# Patient Record
Sex: Female | Born: 1935
Health system: Southern US, Community
[De-identification: ages and names within clinical notes are randomized; demographics above are authoritative.]

## PROBLEM LIST (undated history)

## (undated) DIAGNOSIS — M1990 Unspecified osteoarthritis, unspecified site: Secondary | ICD-10-CM

## (undated) DIAGNOSIS — Z8619 Personal history of other infectious and parasitic diseases: Secondary | ICD-10-CM

## (undated) DIAGNOSIS — F329 Major depressive disorder, single episode, unspecified: Secondary | ICD-10-CM

## (undated) DIAGNOSIS — F32A Depression, unspecified: Secondary | ICD-10-CM

## (undated) DIAGNOSIS — Z8371 Family history of colonic polyps: Secondary | ICD-10-CM

## (undated) DIAGNOSIS — IMO0002 Reserved for concepts with insufficient information to code with codable children: Secondary | ICD-10-CM

## (undated) DIAGNOSIS — E785 Hyperlipidemia, unspecified: Secondary | ICD-10-CM

## (undated) DIAGNOSIS — Z860100 Personal history of colon polyps, unspecified: Secondary | ICD-10-CM

## (undated) DIAGNOSIS — Z83719 Family history of colon polyps, unspecified: Secondary | ICD-10-CM

## (undated) DIAGNOSIS — N390 Urinary tract infection, site not specified: Secondary | ICD-10-CM

## (undated) DIAGNOSIS — I10 Essential (primary) hypertension: Secondary | ICD-10-CM

## (undated) HISTORY — DX: Urinary tract infection, site not specified: N39.0

## (undated) HISTORY — DX: Family history of colon polyps, unspecified: Z83.719

## (undated) HISTORY — DX: Major depressive disorder, single episode, unspecified: F32.9

## (undated) HISTORY — DX: Personal history of other infectious and parasitic diseases: Z86.19

## (undated) HISTORY — DX: Depression, unspecified: F32.A

## (undated) HISTORY — DX: Family history of colonic polyps: Z83.71

## (undated) HISTORY — DX: Reserved for concepts with insufficient information to code with codable children: IMO0002

## (undated) HISTORY — DX: Essential (primary) hypertension: I10

## (undated) HISTORY — DX: Hyperlipidemia, unspecified: E78.5

## (undated) HISTORY — DX: Personal history of colon polyps, unspecified: Z86.0100

## (undated) HISTORY — DX: Unspecified osteoarthritis, unspecified site: M19.90

## (undated) HISTORY — DX: Personal history of colonic polyps: Z86.010

---

## 1999-10-05 HISTORY — PX: BREAST SURGERY: SHX581

## 2009-03-05 ENCOUNTER — Ambulatory Visit: Payer: Self-pay | Admitting: Family Medicine

## 2009-06-09 ENCOUNTER — Emergency Department (HOSPITAL_COMMUNITY): Admission: EM | Admit: 2009-06-09 | Discharge: 2009-06-09 | Payer: Self-pay | Admitting: Family Medicine

## 2009-11-07 ENCOUNTER — Ambulatory Visit: Payer: Self-pay | Admitting: Gastroenterology

## 2010-03-06 ENCOUNTER — Ambulatory Visit: Payer: Self-pay | Admitting: Family Medicine

## 2011-01-08 LAB — POCT URINALYSIS DIP (DEVICE)
Bilirubin Urine: NEGATIVE
Glucose, UA: NEGATIVE mg/dL
Hgb urine dipstick: NEGATIVE
Nitrite: NEGATIVE
Specific Gravity, Urine: 1.01 (ref 1.005–1.030)
Urobilinogen, UA: 0.2 mg/dL (ref 0.0–1.0)
pH: 7.5 (ref 5.0–8.0)

## 2011-01-08 LAB — URINE CULTURE: Colony Count: 80000

## 2011-04-27 ENCOUNTER — Ambulatory Visit: Payer: Self-pay | Admitting: Family Medicine

## 2012-01-01 LAB — HM COLONOSCOPY: HM Colonoscopy: 5

## 2012-03-17 ENCOUNTER — Encounter (HOSPITAL_COMMUNITY): Payer: Self-pay | Admitting: Emergency Medicine

## 2012-03-17 ENCOUNTER — Emergency Department (INDEPENDENT_AMBULATORY_CARE_PROVIDER_SITE_OTHER)
Admission: EM | Admit: 2012-03-17 | Discharge: 2012-03-17 | Disposition: A | Discharge status: Home / Self Care | Payer: Medicare Other | Source: Home / Self Care | Attending: Emergency Medicine | Admitting: Emergency Medicine

## 2012-03-17 DIAGNOSIS — N390 Urinary tract infection, site not specified: Secondary | ICD-10-CM

## 2012-03-17 HISTORY — DX: Essential (primary) hypertension: I10

## 2012-03-17 LAB — POCT URINALYSIS DIP (DEVICE)
Glucose, UA: NEGATIVE mg/dL
Specific Gravity, Urine: 1.025 (ref 1.005–1.030)
pH: 5.5 (ref 5.0–8.0)

## 2012-03-17 MED ORDER — CEPHALEXIN 500 MG PO CAPS: 500.0000 mg | ORAL_CAPSULE | Freq: Three times a day (TID) | ORAL | Status: AC

## 2012-03-17 MED ORDER — ONDANSETRON 8 MG PO TBDP: 8.0000 mg | ORAL_TABLET | Freq: Three times a day (TID) | ORAL | Status: AC | PRN

## 2012-03-17 MED ORDER — CEFTRIAXONE SODIUM 1 G IJ SOLR
1.0000 g | Freq: Once | INTRAMUSCULAR | Status: AC
  Administered 2012-03-17: 1 g via INTRAMUSCULAR

## 2012-03-17 MED ORDER — CEFTRIAXONE SODIUM 1 G IJ SOLR
INTRAMUSCULAR | Status: AC
  Filled 2012-03-17: qty 10

## 2012-03-17 NOTE — ED Notes (Signed)
Pt is oriented  x3 and states the fever started today and vomited once this morning. Vomit was a yellow fluid. Pt states she has not eaten since yesterday and only drinking fluids. Pt is speaking full sentences without difficultly breathing, and states she feels some nausea. I advise Pt that if anything changes to let the front desk know.

## 2012-03-17 NOTE — ED Notes (Signed)
PT HERE WITH POSS VIRAL INFECTION THAT STARTED Wednesday FROM EXPOSURE OF FAMILY MEMBER WITH GI SX'S.STATES ONSET TEMP TODAY 104.0 THEN RECHECKED 103.0 BUT NO FEVER REDUCERS TAKEN.ALSO REPORTS X 1 EPISODE OF VOMITING THIS AM AND POOR APPETITE.NO DIARRHEA,CP OR SOB

## 2012-03-17 NOTE — Discharge Instructions (Signed)

## 2012-03-17 NOTE — ED Provider Notes (Signed)
Chief Complaint  Patient presents with  . Fever  . GI Problem    History of Present Illness:   The patient is a 78 year old female who has had a two-day history of fever of up to 104, chills, nauseated, and she vomited once. She denies any headache, nasal congestion, rhinorrhea, sore throat or stiff neck. She's had no coughing, shortness of breath, or chest pain. She denies any abdominal pain, diarrhea, blood in the stool. She's had no urinary frequency, urgency, dysuria, or blood in the urine. She denies any skin rash, or history of a tick bite. She's had no suspicious exposures, animal exposure, recent foreign travel.  Review of Systems:  Other than noted above, the patient denies any of the following symptoms. Systemic:  No fever, chills, sweats, fatigue, myalgias, headache, or anorexia. Eye:  No redness, pain or drainage. ENT:  No earache, nasal congestion, rhinorrhea, sinus pressure, or sore throat. Lungs:  No cough, sputum production, wheezing, shortness of breath.  Cardiovascular:  No chest pain, palpitations, or syncope. GI:  No nausea, vomiting, abdominal pain or diarrhea. GU:  No dysuria, frequency, or hematuria. Skin:  No rash or pruritis.  PMFSH:  Past medical history, family history, social history, meds, and allergies were reviewed.  Physical Exam:   Vital signs:  BP 183/69  Pulse 82  Temp 101.8 F (38.8 C) (Oral)  Resp 20  SpO2 97% General:  Alert, in no distress. Eye:  PERRL, full EOMs.  Lids and conjunctivas were normal. ENT:  TMs and canals were normal, without erythema or inflammation.  Nasal mucosa was clear and uncongested, without drainage.  Mucous membranes were moist.  Pharynx was clear, without exudate or drainage.  There were no oral ulcerations or lesions. Neck:  Supple, no adenopathy, tenderness or mass. Thyroid was normal. Lungs:  No respiratory distress.  Lungs were clear to auscultation, without wheezes, rales or rhonchi.  Breath sounds were clear and  equal bilaterally. Heart:  Regular rhythm, without gallops, murmers or rubs. Abdomen:  Soft, flat, and non-tender to palpation.  No hepatosplenomagaly or mass. Skin:  Clear, warm, and dry, without rash or lesions.  Labs:   Results for orders placed during the hospital encounter of 03/17/12  POCT URINALYSIS DIP (DEVICE)      Component Value Range   Glucose, UA NEGATIVE  NEGATIVE mg/dL   Bilirubin Urine SMALL (*) NEGATIVE   Ketones, ur 15 (*) NEGATIVE mg/dL   Specific Gravity, Urine 1.025  1.005 - 1.030   Hgb urine dipstick MODERATE (*) NEGATIVE   pH 5.5  5.0 - 8.0   Protein, ur >=300 (*) NEGATIVE mg/dL   Urobilinogen, UA 4.0 (*) 0.0 - 1.0 mg/dL   Nitrite NEGATIVE  NEGATIVE   Leukocytes, UA TRACE (*) NEGATIVE    Other Labs Obtained at Urgent Care Center:  A urine culture was obtained.  Results are pending at this time and we will call about any positive results.  Assessment:  The encounter diagnosis was UTI (lower urinary tract infection).  Plan:   1.  The following meds were prescribed:   New Prescriptions   CEPHALEXIN (KEFLEX) 500 MG CAPSULE    Take 1 capsule (500 mg total) by mouth 3 (three) times daily.   ONDANSETRON (ZOFRAN ODT) 8 MG DISINTEGRATING TABLET    Take 1 tablet (8 mg total) by mouth every 8 (eight) hours as needed for nausea.   2.  The patient was instructed in symptomatic care and handouts were given. 3.  The patient  was told to return if becoming worse in any way, if no better in 3 or 4 days, and given some red flag symptoms that would indicate earlier return.  Follow up:  The patient was told to follow up here in 48 hours for recheck. She was given some red flag symptoms that would indicate earlier return to the emergency department.     Reuben Likes, MD 03/17/12 2106

## 2012-03-17 NOTE — ED Notes (Signed)
No reaction post atb im injection

## 2012-03-19 ENCOUNTER — Encounter (HOSPITAL_COMMUNITY): Payer: Self-pay

## 2012-03-19 ENCOUNTER — Emergency Department (HOSPITAL_COMMUNITY)
Admission: EM | Admit: 2012-03-19 | Discharge: 2012-03-19 | Disposition: A | Discharge status: Home / Self Care | Payer: Medicare Other | Source: Home / Self Care | Attending: Emergency Medicine | Admitting: Emergency Medicine

## 2012-03-19 DIAGNOSIS — N289 Disorder of kidney and ureter, unspecified: Secondary | ICD-10-CM

## 2012-03-19 LAB — POCT URINALYSIS DIP (DEVICE)
Ketones, ur: NEGATIVE mg/dL
Protein, ur: >=300 mg/dL — AB
pH: 5.5 (ref 5.0–8.0)

## 2012-03-19 LAB — DIFFERENTIAL
Basophils Absolute: 0 10*3/uL (ref 0.0–0.1)
Eosinophils Relative: 0 % (ref 0–5)
Lymphocytes Relative: 8 % — ABNORMAL LOW (ref 12–46)
Lymphs Abs: 0.8 10*3/uL (ref 0.7–4.0)
Neutro Abs: 8.8 10*3/uL — ABNORMAL HIGH (ref 1.7–7.7)
Neutrophils Relative %: 85 % — ABNORMAL HIGH (ref 43–77)

## 2012-03-19 LAB — CBC
MCV: 90 fL (ref 78.0–100.0)
Platelets: 164 10*3/uL (ref 150–400)
RBC: 3.9 MIL/uL (ref 3.87–5.11)
RDW: 13.8 % (ref 11.5–15.5)
WBC: 10.3 10*3/uL (ref 4.0–10.5)

## 2012-03-19 LAB — POCT I-STAT, CHEM 8
BUN: 22 mg/dL (ref 6–23)
Potassium: 3.7 mEq/L (ref 3.5–5.1)
Sodium: 135 mEq/L (ref 135–145)
TCO2: 25 mmol/L (ref 0–100)

## 2012-03-19 NOTE — ED Provider Notes (Signed)
History     CSN: [id number]  Arrival date & time 03/19/12  1103   First MD Initiated Contact with Patient 03/19/12 1111      Chief Complaint  Patient presents with  . Follow-up    (Consider location/radiation/quality/duration/timing/severity/associated sxs/prior treatment) HPI Comments: Patient returns here as instructed for a followup visit after 48 hours after having started antibiotics. She describes that occasionally the antibiotics make her nauseous, she had a fever yesterday of 104. She was not taking any, tylenol or Motrin today.. She denies any abdominal pain, vomiting or diarrhea .  I just feel lfeel tired and weak" . Patient continues to deny any respiratory symptoms such as cough, upper congestion or shortness of breath.  On further questioning patient denies any flank pain, vomiting, or headaches. She is taking her antibiotics as prescribed and took a Tylenol dose yesterday denies taking any other medications for her symptoms.     The history is provided by the patient.    Past Medical History  Diagnosis Date  . Hypertension     History reviewed. No pertinent past surgical history.  History reviewed. No pertinent family history.  History  Substance Use Topics  . Smoking status: Never Smoker   . Smokeless tobacco: Not on file  . Alcohol Use: Yes     occasional    OB History    Grav Para Term Preterm Abortions TAB SAB Ect Mult Living                  Review of Systems  Constitutional: Positive for fever, activity change, appetite change and fatigue. Negative for chills and unexpected weight change.  HENT: Negative for congestion, neck pain, neck stiffness and postnasal drip.   Eyes: Negative for photophobia, pain and visual disturbance.  Respiratory: Negative for cough and shortness of breath.   Cardiovascular: Negative for chest pain and leg swelling.  Genitourinary: Positive for urgency and frequency. Negative for dysuria, flank pain, enuresis,  vaginal pain, pelvic pain and dyspareunia.  Skin: Negative for rash.  Neurological: Negative for dizziness, numbness and headaches.    Allergies  Review of patient's allergies indicates no known allergies.  Home Medications   Current Outpatient Rx  Name Route Sig Dispense Refill  . AMLODIPINE BESYLATE 5 MG PO TABS Oral Take 5 mg by mouth daily.    . ATENOLOL 25 MG PO TABS Oral Take 25 mg by mouth daily.    . CEPHALEXIN 500 MG PO CAPS Oral Take 1 capsule (500 mg total) by mouth 3 (three) times daily. 30 capsule 0  . ONDANSETRON 8 MG PO TBDP Oral Take 1 tablet (8 mg total) by mouth every 8 (eight) hours as needed for nausea. 20 tablet 0  . SIMVASTATIN 20 MG PO TABS Oral Take 20 mg by mouth every evening.      BP 138/82  Pulse 63  Temp 99.1 F (37.3 C) (Oral)  Resp 16  SpO2 96%  Physical Exam  Nursing note and vitals reviewed. Constitutional: She appears well-developed and well-nourished. No distress.  Pulmonary/Chest: Effort normal and breath sounds normal.  Abdominal: Soft. She exhibits no distension and no mass. There is no tenderness. There is no rigidity, no rebound, no guarding, no CVA tenderness, no tenderness at McBurney's point and negative Murphy's sign.  Skin: No rash noted.    ED Course  Procedures (including critical care time)  Labs Reviewed  POCT URINALYSIS DIP (DEVICE) - Abnormal; Notable for the following:    Bilirubin Urine SMALL (*)  Hgb urine dipstick SMALL (*)     Protein, ur >=300 (*)     All other components within normal limits  CBC - Abnormal; Notable for the following:    HCT 35.1 (*)     MCHC 36.8 (*)     All other components within normal limits  DIFFERENTIAL - Abnormal; Notable for the following:    Neutrophils Relative 85 (*)     Neutro Abs 8.8 (*)     Lymphocytes Relative 8 (*)     All other components within normal limits  POCT I-STAT, CHEM 8 - Abnormal; Notable for the following:    Creatinine, Ser 1.70 (*)     Glucose, Bld 110  (*)     Calcium, Ion 1.07 (*)     All other components within normal limits  URINE CULTURE   No results found.   1. Renal insufficiency, mild    2.mild dehydration 3. urinary symptoms    MDM  Patient looks comfortable in no distress with a normal abdominal exam. Symptomatically patient expresses predominantly weakness and polyuria. Patient has a normal creatinine and 1.7 with no baseline labs to compare with with noticeable proteinuria. Patient was otherwise to followup with her primary care Dr. early this week to continue monitoring her treatment response and her creatinine levels. We discuss in detail the different possibilities including mild to moderate dehydration, patient understand that this test needs to be repeated in her condition needs to be monitored. We also discuss symptoms that would warrant further evaluation in the emergency department. Patient is afebrile we have sent a sample today with for a urine culture as she continues to experience some degree of increased urinary frequency.        Jimmie Molly, MD 03/19/12 [phone]

## 2012-03-19 NOTE — Discharge Instructions (Signed)
Have discussed with you today your test results. We discussed in particular your creatinine elevation in otherwise good to see your doctor in 2-3 days to discuss this further and monitor this abnormal finding. Continue with antibiotics as previously indicated, we will contact you if further treatment will be required based on your culture results    Kidney Failure In kidney failure, the kidneys lose their ability to filter enough waste products from the blood. They also lose the ability to regulate the body's balance of salt and water. Eventually, the kidneys slow their production of urine or stop producing it completely. Waste products and water gather in the body. This can lead to a life-threatening overload of fluids (such as heart failure). It can also lead to a dangerous buildup of waste products in the blood. These extreme changes in blood chemistry can affect the function of the heart and brain.  TYPES OF KIDNEY FAILURE Acute kidney failure. In this form of kidney failure, the kidneys stop working properly because of a sudden illness, a medicine, or a medical condition that causes one of the following:   A severe drop in blood pressure or an interruption in the normal blood flow to the kidneys. This can occur during:   Major surgery.   Severe burns with fluid loss.   Massive bleeding.   A heart attack that severely affects heart function.   Blood clots that travel to the kidney.   Direct damage to kidney cells or to the kidneys' filtering units. This can be caused by:   An inflammation of the kidneys.   Toxic chemicals.   Medicines or infections.   Blocked urine flow from the kidney. This can occur because of obstructions outside the kidney, such as:   Kidney stones.   Bladder tumors.   An enlarged prostate.  Blockage of urine flow within the kidney can also cause sudden kidney failure, as can occur with large muscle injuries.  Chronic kidney failure. In this form of  kidney failure, the kidney gradually loses function. This happens over a period of years. It is a slow and gradual loss of the ability of the kidneys to send out wastes, concentrate urine, and conserve the salts in your blood. Some of the causes of chronic kidney failure are:  Diabetes (very common cause).   Polycystic kidney disease.   Glomerulonephritis.   Alport syndrome.   The flow of urine out of the kidney is blocked (obstructive uropathy).   High blood pressure (very common).   Long-term exposure to lead, mercury, and other chemicals and medicines.   Kidney stones with infection.   Reflux nephropathy.   Pain medicine overuse.  Some forms of chronic kidney failure run in families. Your caregiver will ask you about family medical problems.  End-stage kidney disease (ESKD). This is also called end-stage kidney failure. In ESKD, kidney function worsens until the person dies. This is usually the result of longstanding chronic kidney failure, but sometimes it follows acute kidney failure. SYMPTOMS  Symptoms vary depending on the type of kidney failure.   Acute kidney failure. Symptoms include:   Swelling (edema) resulting from salt and water overload.   High blood pressure.   Vomiting.   Tiredness (lethargy) caused by the toxic effects of waste products on brain function.   Feeling sick to your stomach (nauseous).   Decreased urine output.   Chronic kidney failure and ERSD. Because the kidney damage in chronic kidney failure occurs slowly over a long time, symptoms develop  slowly. Symptoms can include:   Headache.   Weakness.   Itching.   Vomiting.   Pale skin.   Slowing of growth in children.   Fatigue.   Tiredness (lethargy).   Poor appetite.   Increased thirst.   High blood pressure.   Bone damage in adults.  DIAGNOSIS  If you have an illness or medical condition that increases the risk of acute kidney failure, your caregivers will watch you  closely. You may have blood and urine tests that measure the function of your kidneys. If you have a medical condition that increases the risk of long-term kidney damage, your caregiver will check your blood pressure and look for symptoms of chronic kidney failure during rechecks. TREATMENT  Treatment depends on the type of kidney failure.   Acute kidney failure. Treatment begins with measures to correct the cause of kidney failure (shock, hemorrhage, burns, heart attack). After this has begun, more specific kidney treatment may include:   Fluids given through the vein (intravenously) to correct any abnormal fluid loss.   Medicines called diuretics that increase urine output.   Limited fluids by mouth.   A diet low in protein and high in carbohydrates.   Medicines to adjust high or low levels of blood chemicals, such as potassium and medicines to control high blood pressure.   Short-term dialysis may be necessary if the patient develops severe high blood pressure, severe fluid overload, heart failure, symptoms of altered brain function, or severe abnormalities in blood chemistry.   Chronic kidney failure. People with chronic kidney failure are watched closely. They receive frequent physical exams, blood pressure checks, and blood testing. Treatment includes:   A low-protein and low-salt diet.   Medicines to adjust blood chemical levels.   Medicines to treat high blood pressure.   Sometimes, a hormonal medicine called erythropoietin is given to correct a low level of red blood cells (anemia).   ESKD. Treatment includes:   Dialysis until a donor can be found for a kidney transplant. Dialysis mechanically removes waste products from the blood.   Both kidneys may need to be removed surgically before a transplant in patients with severe high blood pressure or chronic pyelonephritis.  PROGNOSIS   Acute kidney failure may go away on its own. Some people recover within a matter of days.  Exactly how long the illness lasts varies greatly from person-to-person. The duration depends on the cause of the kidney problem. In rare cases, acute kidney failure progresses to ESKD. Among people who recover, about 50% have some permanent kidney damage. In most cases, this is not severe enough to prevent you from living a normal life.   Chronic kidney failure is a lifelong problem that can worsen over time to become ESKD. Not everyone develops ESKD. For those who do, the time it takes for ESKD to develop varies from person-to-person.   ESKD is a permanent condition that can be treated only with dialysis or a kidney transplant.  PREVENTION  Many forms of kidney failure cannot be prevented. People who have diabetes, high blood pressure, or coronary artery disease should try to control the illness with:  Appropriate diet.   Medicine.   Lifestyle changes.  If you have chronic kidney failure, you should tell all caregivers who treat you.  HOME CARE INSTRUCTIONS   Follow your diet and take your medicines as instructed.   Do not use any new medicines (prescription, over-the-counter, or nutritional supplements) unless approved by your caregiver. Many medicines can worsen your  kidney damage or need to have the dose adjusted.   If dialysis is scheduled, keep all appointments. Call if you are unable to keep an appointment.  SEEK MEDICAL CARE IF:   You develop unexplained weakness, tiredness, or appetite loss.   You feel poorly with no clear explanation.  SEEK IMMEDIATE MEDICAL CARE IF:   The amount of urine you produce either distinctly increases or decreases.   You develop swelling of the face and/or ankles.   You develop shortness of breath.  FOR MORE INFORMATION  National Institute of Diabetes and Digestive and Kidney Diseases: CheatPrevention.com.au National Kidney Foundation: www.kidney.org Document Released: 09/20/2005 Document Revised: 09/09/2011 Document Reviewed:  01/21/2010 [facility] Patient Information 2012 [place], [place].

## 2012-03-19 NOTE — ED Notes (Signed)
Pt was told to return today for recheck, she is weak and states she doesn't feel better, but denies pain and states her temp was 104 and didn't start tylenol until yesterday.  She is afebrile today and states she is just weak.

## 2012-03-20 LAB — URINE CULTURE
Colony Count: NO GROWTH
Culture  Setup Time: 201306161656
Culture: NO GROWTH
Special Requests: NORMAL

## 2012-03-21 ENCOUNTER — Inpatient Hospital Stay: Payer: Self-pay | Admitting: Internal Medicine

## 2012-03-21 LAB — URINALYSIS, COMPLETE
Glucose,UR: NEGATIVE mg/dL (ref 0–75)
Nitrite: NEGATIVE
Ph: 6 (ref 4.5–8.0)
Protein: 100
RBC,UR: 2 /HPF (ref 0–5)
Specific Gravity: 1.008 (ref 1.003–1.030)

## 2012-03-21 LAB — COMPREHENSIVE METABOLIC PANEL
Alkaline Phosphatase: 116 U/L (ref 50–136)
Bilirubin,Total: 0.7 mg/dL (ref 0.2–1.0)
Calcium, Total: 8 mg/dL — ABNORMAL LOW (ref 8.5–10.1)
Chloride: 97 mmol/L — ABNORMAL LOW (ref 98–107)
Creatinine: 1.51 mg/dL — ABNORMAL HIGH (ref 0.60–1.30)
EGFR (Non-African Amer.): 33 — ABNORMAL LOW
Osmolality: 267 (ref 275–301)
Potassium: 3.3 mmol/L — ABNORMAL LOW (ref 3.5–5.1)
Sodium: 132 mmol/L — ABNORMAL LOW (ref 136–145)

## 2012-03-21 LAB — TROPONIN I: Troponin-I: 0.1 ng/mL — ABNORMAL HIGH

## 2012-03-21 LAB — CBC
HCT: 34.6 % — ABNORMAL LOW (ref 35.0–47.0)
MCH: 32.5 pg (ref 26.0–34.0)
MCHC: 34.1 g/dL (ref 32.0–36.0)
MCV: 95 fL (ref 80–100)

## 2012-03-21 LAB — CK TOTAL AND CKMB (NOT AT ARMC)
CK, Total: 397 U/L — ABNORMAL HIGH (ref 21–215)
CK-MB: 1.4 ng/mL (ref 0.5–3.6)

## 2012-03-22 DIAGNOSIS — R0602 Shortness of breath: Secondary | ICD-10-CM

## 2012-03-22 DIAGNOSIS — R748 Abnormal levels of other serum enzymes: Secondary | ICD-10-CM

## 2012-03-22 LAB — CBC WITH DIFFERENTIAL/PLATELET
Basophil #: 0 10*3/uL (ref 0.0–0.1)
HCT: 32.2 % — ABNORMAL LOW (ref 35.0–47.0)
HGB: 11.1 g/dL — ABNORMAL LOW (ref 12.0–16.0)
Lymphocyte %: 14.2 %
MCH: 32.5 pg (ref 26.0–34.0)
MCV: 95 fL (ref 80–100)
Monocyte #: 0.7 x10 3/mm (ref 0.2–0.9)
Monocyte %: 7.6 %
Neutrophil #: 6.7 10*3/uL — ABNORMAL HIGH (ref 1.4–6.5)
Neutrophil %: 77.7 %
RBC: 3.41 10*6/uL — ABNORMAL LOW (ref 3.80–5.20)
WBC: 8.6 10*3/uL (ref 3.6–11.0)

## 2012-03-22 LAB — LIPID PANEL
Cholesterol: 89 mg/dL (ref 0–200)
HDL Cholesterol: 11 mg/dL — ABNORMAL LOW (ref 40–60)
Triglycerides: 141 mg/dL (ref 0–200)

## 2012-03-22 LAB — BASIC METABOLIC PANEL
BUN: 11 mg/dL (ref 7–18)
Calcium, Total: 7.9 mg/dL — ABNORMAL LOW (ref 8.5–10.1)
Chloride: 103 mmol/L (ref 98–107)
EGFR (Non-African Amer.): 48 — ABNORMAL LOW
Glucose: 103 mg/dL — ABNORMAL HIGH (ref 65–99)
Sodium: 137 mmol/L (ref 136–145)

## 2012-03-22 LAB — TROPONIN I: Troponin-I: 0.09 ng/mL — ABNORMAL HIGH

## 2012-03-22 LAB — MAGNESIUM: Magnesium: 2.1 mg/dL

## 2012-03-23 LAB — URINE CULTURE

## 2012-03-26 LAB — CULTURE, BLOOD (SINGLE)

## 2012-04-02 LAB — HM MAMMOGRAPHY: HM Mammogram: NORMAL

## 2012-04-02 LAB — HM COLONOSCOPY: HM Colonoscopy: 5

## 2012-05-22 ENCOUNTER — Ambulatory Visit: Payer: Self-pay | Admitting: Family Medicine

## 2012-07-03 LAB — HM MAMMOGRAPHY: HM Mammogram: NORMAL

## 2012-12-08 ENCOUNTER — Ambulatory Visit: Payer: Medicare Other | Admitting: Internal Medicine

## 2012-12-29 ENCOUNTER — Encounter: Payer: Self-pay | Admitting: Internal Medicine

## 2012-12-29 ENCOUNTER — Ambulatory Visit (INDEPENDENT_AMBULATORY_CARE_PROVIDER_SITE_OTHER): Payer: Medicare Other | Admitting: Internal Medicine

## 2012-12-29 VITALS — BP 132/78 | HR 53 | Temp 98.1°F | Resp 16 | Ht 61.0 in | Wt 173.8 lb

## 2012-12-29 DIAGNOSIS — R5383 Other fatigue: Secondary | ICD-10-CM

## 2012-12-29 DIAGNOSIS — Z79899 Other long term (current) drug therapy: Secondary | ICD-10-CM

## 2012-12-29 DIAGNOSIS — E559 Vitamin D deficiency, unspecified: Secondary | ICD-10-CM

## 2012-12-29 DIAGNOSIS — E785 Hyperlipidemia, unspecified: Secondary | ICD-10-CM

## 2012-12-29 DIAGNOSIS — R5381 Other malaise: Secondary | ICD-10-CM

## 2012-12-29 DIAGNOSIS — Z86010 Personal history of colonic polyps: Secondary | ICD-10-CM

## 2012-12-29 DIAGNOSIS — I10 Essential (primary) hypertension: Secondary | ICD-10-CM

## 2012-12-29 LAB — COMPREHENSIVE METABOLIC PANEL
AST: 26 U/L (ref 0–37)
Alkaline Phosphatase: 57 U/L (ref 39–117)
BUN: 20 mg/dL (ref 6–23)
Creatinine, Ser: 1.1 mg/dL (ref 0.4–1.2)

## 2012-12-29 LAB — CBC WITH DIFFERENTIAL/PLATELET
Basophils Relative: 0.5 % (ref 0.0–3.0)
Eosinophils Absolute: 0.1 10*3/uL (ref 0.0–0.7)
Hemoglobin: 12.8 g/dL (ref 12.0–15.0)
MCHC: 33.9 g/dL (ref 30.0–36.0)
MCV: 96.2 fl (ref 78.0–100.0)
Monocytes Absolute: 0.5 10*3/uL (ref 0.1–1.0)
Neutro Abs: 2.9 10*3/uL (ref 1.4–7.7)
Neutrophils Relative %: 52.8 % (ref 43.0–77.0)
RBC: 3.93 Mil/uL (ref 3.87–5.11)
RDW: 14.9 % — ABNORMAL HIGH (ref 11.5–14.6)

## 2012-12-29 LAB — LIPID PANEL
Cholesterol: 178 mg/dL (ref 0–200)
Triglycerides: 47 mg/dL (ref 0.0–149.0)
VLDL: 9.4 mg/dL (ref 0.0–40.0)

## 2012-12-29 MED ORDER — METOPROLOL SUCCINATE ER 50 MG PO TB24: 50.0000 mg | ORAL_TABLET | Freq: Every day | ORAL | Status: DC

## 2012-12-29 NOTE — Progress Notes (Signed)
Patient ID: Tiffany Benjamin, female   DOB: 08-12-1933, 79 y.o.   MRN: [id number]   Patient Active Problem List  Diagnosis  . Arthritis  . Hypertension  . Hyperlipidemia  . History of colonic polyps    Subjective:  CC:   Chief Complaint  Patient presents with  . Establish Care    HPI:   Tiffany Benjamin is a 79 y.o. female who presents as a new patient to establish primary care with the chief complaint of New patient,  Transferring from [facility] due to lack of followup and labs,  No labs done in over one year .  She is fasting today in anticipation of need for fasting lab work.  Hypertension diagnosed 20 yrs ago during husbands diagnosis of  throat ca . Initially attributed to stress but has remained hypertensive.   Joint pain:  She has OA in both knees and both middle fingers.  Some trouble opening jars. No history of SLE, gout, RA.   History of low back pain remotely, resolved with suspension therapy   Occasional insomnia.  She is a early riser from habit.    And and Past Medical History  Diagnosis Date  . History of chicken pox   . Depression   . Ulcer   . Family history of polyps in the colon   . UTI (urinary tract infection)   . Arthritis   . Hypertension   . Hyperlipidemia   . History of colonic polyps     Past Surgical History  Procedure Laterality Date  . Breast surgery Right 2001    bernign lumpectomy     Family History  Problem Relation Age of Onset  . Stroke Mother 22    cerebral hemorrhage  . Hypertension Mother   . Diabetes Mother   . Heart disease Father   . Heart attack Father 26  . Cancer Sister     pancreatic  . Cancer Sister 2    retroperitoneal Ca removed, doing fine   . Hyperlipidemia Son     History   Social History  . Marital Status: Widowed    Spouse Name: N/A    Number of Children: N/A  . Years of Education: N/A   Occupational History  . Not on file.   Social History Main Topics  . Smoking status: Never Smoker   .  Smokeless tobacco: Not on file  . Alcohol Use: 1.8 oz/week    3 Glasses of wine per week     Comment: occasional  . Drug Use: No  . Sexually Active: Not on file   Other Topics Concern  . Not on file   Social History Narrative   Social:  Her husband died about 20 years ago from throat cancer.  She then cared for her father for  20 yrs in her home before he passed.    She is retired 20 yrs from [employer]. 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 Likes to stay busy.  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   Exercises regularly, goes to [employer] 3 days a week and stretches before hand.          No Known Allergies   Review of Systems:   Patient denies headache, fevers, malaise, unintentional weight loss, skin rash, eye pain, sinus congestion and sinus pain, sore throat, dysphagia,  hemoptysis , cough, dyspnea, wheezing, chest pain, palpitations, orthopnea, edema, abdominal pain, nausea, melena, diarrhea, constipation, flank pain, dysuria, hematuria, urinary  Frequency, nocturia, numbness, tingling, seizures,  Focal weakness, Loss of consciousness,  Tremor, insomnia, depression, anxiety, and suicidal ideation.    Objective:  BP 132/78  Pulse 53  Temp(Src) 98.1 F (36.7 C) (Oral)  Resp 16  Ht 5\' 1"  (1.549 m)  Wt 173 lb 12 oz (78.812 kg)  BMI 32.85 kg/m2  SpO2 98%  General appearance: alert, cooperative and appears stated age Ears: normal TM's and external ear canals both ears Throat: lips, mucosa, and tongue normal; teeth and gums normal Neck: no adenopathy, no carotid bruit, supple, symmetrical, trachea midline and thyroid not enlarged, symmetric, no tenderness/mass/nodules Back: symmetric, no curvature. ROM normal. No CVA tenderness. Lungs: clear to auscultation bilaterally Heart: regular rate and rhythm, S1, S2 normal, no murmur, click, rub or gallop Abdomen: soft, non-tender; bowel sounds normal; no masses,  no organomegaly Pulses: 2+ and  symmetric Skin: Skin color, texture, turgor normal. No rashes or lesions Lymph nodes: Cervical, supraclavicular, and axillary nodes normal.  Assessment and Plan:  Hypertension Well controlled on current regimen. Renal function and electrolytes are normal today. No changes to regimen.  Hyperlipidemia Managed with low-dose simvastatin. Fasting lipid panel today is excellent. Liver enzymes are normal. Refills given.  History of colonic polyps She is up-to-date on colon cancer screening, last colonoscopy last year.   Updated Medication List Outpatient Encounter Prescriptions as of 12/29/2012  Medication Sig Dispense Refill  . amLODipine (NORVASC) 5 MG tablet Take 5 mg by mouth daily.      . simvastatin (ZOCOR) 20 MG tablet Take 20 mg by mouth every evening.      . [DISCONTINUED] atenolol (TENORMIN) 25 MG tablet Take 25 mg by mouth daily.      . metoprolol succinate (TOPROL-XL) 50 MG 24 hr tablet Take 1 tablet (50 mg total) by mouth daily. Take with or immediately following a meal.  90 tablet  3  . [DISCONTINUED] metoprolol (LOPRESSOR) 50 MG tablet Take 1 tablet by mouth daily.       No facility-administered encounter medications on file as of 12/29/2012.     Orders Placed This Encounter  Procedures  . HM MAMMOGRAPHY  . HM MAMMOGRAPHY  . Lipid panel  . Vitamin D 25 hydroxy  . CBC with Differential  . Comprehensive metabolic panel  . TSH  . HM COLONOSCOPY  . HM COLONOSCOPY    No Follow-up on file.

## 2012-12-29 NOTE — Patient Instructions (Addendum)
I am changing your metoprolol tartrate to metoprolol succinate which is only needed once daily  Continue the amlodipine  You cna finish your current metoprolol bottle first before switching to the once daily formula  We will refill your simvastatin  for 90 days once I review your labs   You should be getting a minimum of 1200 mg of calcium daily and 1000 units of vitamin D daily (unless your vitamin d is low today,  I will let you know)Return for your annual medicare wellness exam which will include a pelvic exam and breast exam (3 months)

## 2012-12-30 LAB — VITAMIN D 25 HYDROXY (VIT D DEFICIENCY, FRACTURES): Vit D, 25-Hydroxy: 38 ng/mL (ref 30–89)

## 2012-12-31 ENCOUNTER — Encounter: Payer: Self-pay | Admitting: Internal Medicine

## 2012-12-31 DIAGNOSIS — M1990 Unspecified osteoarthritis, unspecified site: Secondary | ICD-10-CM | POA: Insufficient documentation

## 2012-12-31 DIAGNOSIS — I10 Essential (primary) hypertension: Secondary | ICD-10-CM | POA: Insufficient documentation

## 2012-12-31 DIAGNOSIS — E785 Hyperlipidemia, unspecified: Secondary | ICD-10-CM | POA: Insufficient documentation

## 2012-12-31 DIAGNOSIS — Z86010 Personal history of colonic polyps: Secondary | ICD-10-CM | POA: Insufficient documentation

## 2012-12-31 NOTE — Assessment & Plan Note (Signed)
She is up-to-date on colon cancer screening, last colonoscopy last year.

## 2012-12-31 NOTE — Assessment & Plan Note (Signed)
Managed with low-dose simvastatin. Fasting lipid panel today is excellent. Liver enzymes are normal. Refills given.

## 2012-12-31 NOTE — Assessment & Plan Note (Signed)
Well controlled on current regimen. Renal function and electrolytes are normal today. No changes to regimen.

## 2013-01-01 ENCOUNTER — Encounter: Payer: Self-pay | Admitting: General Practice

## 2013-01-05 ENCOUNTER — Encounter: Payer: Self-pay | Admitting: Internal Medicine

## 2013-01-22 ENCOUNTER — Other Ambulatory Visit: Payer: Self-pay | Admitting: *Deleted

## 2013-01-22 ENCOUNTER — Encounter: Payer: Self-pay | Admitting: *Deleted

## 2013-01-22 MED ORDER — SIMVASTATIN 20 MG PO TABS: 20.0000 mg | ORAL_TABLET | Freq: Every evening | ORAL | Status: DC

## 2013-01-22 MED ORDER — AMLODIPINE BESYLATE 5 MG PO TABS: 5.0000 mg | ORAL_TABLET | Freq: Every day | ORAL | Status: DC

## 2013-01-22 NOTE — Telephone Encounter (Signed)
Patient called stating that she needs a refill on her Simvastatin and Amlodipine if she is to continue with these med. Patient states that one of her medications was refilled, but these 2 were not. Patient states that she is new to Dr. Darrick Huntsman and was not sure if she is to continue them. Advised patient that a message will be sent to Dr. Darrick Huntsman regarding the refills and she can check with her pharmacy this afternoon.

## 2013-04-02 ENCOUNTER — Ambulatory Visit (INDEPENDENT_AMBULATORY_CARE_PROVIDER_SITE_OTHER): Payer: Medicare Other | Admitting: Internal Medicine

## 2013-04-02 ENCOUNTER — Encounter: Payer: Self-pay | Admitting: Internal Medicine

## 2013-04-02 VITALS — BP 168/84 | HR 58 | Temp 98.2°F | Resp 14 | Ht 63.0 in | Wt 172.2 lb

## 2013-04-02 DIAGNOSIS — Z01419 Encounter for gynecological examination (general) (routine) without abnormal findings: Secondary | ICD-10-CM

## 2013-04-02 DIAGNOSIS — Z0000 Encounter for general adult medical examination without abnormal findings: Secondary | ICD-10-CM | POA: Insufficient documentation

## 2013-04-02 DIAGNOSIS — I10 Essential (primary) hypertension: Secondary | ICD-10-CM

## 2013-04-02 DIAGNOSIS — Z13820 Encounter for screening for osteoporosis: Secondary | ICD-10-CM

## 2013-04-02 NOTE — Progress Notes (Signed)
Patient ID: Tiffany Benjamin, female   DOB: December 29, 1932, 79 y.o.   MRN: [id number] The patient is here for annual Medicare wellness examination and management of other chronic and acute problems.   The risk factors are reflected in the social history.  The roster of all physicians providing medical care to patient - is listed in the Snapshot section of the chart.  Activities of daily living:  The patient is 100% independent in all ADLs: dressing, toileting, feeding as well as independent mobility  Home safety : The patient has smoke detectors in the home. They wear seatbelts.  There are no firearms at home. There is no violence in the home.   There is no risks for hepatitis, STDs or HIV. There is no   history of blood transfusion. They have no travel history to infectious disease endemic areas of the world.  The patient has seen their dentist in the last six month. They have seen their eye doctor in the last year. They admit to slight hearing difficulty with regard to whispered voices and some television programs.  They have deferred audiologic testing in the last year.  They do not  have excessive sun exposure. Discussed the need for sun protection: hats, long sleeves and use of sunscreen if there is significant sun exposure.   Diet: the importance of a healthy diet is discussed. They do have a healthy diet.  The benefits of regular aerobic exercise were discussed. She walks 4 times per week ,  20 minutes.   Depression screen: there are no signs or vegative symptoms of depression- irritability, change in appetite, anhedonia, sadness/tearfullness.  Cognitive assessment: the patient manages all their financial and personal affairs and is actively engaged. They could relate day,date,year and events; recalled 2/3 objects at 3 minutes; performed clock-face test normally.  The following portions of the patient's history were reviewed and updated as appropriate: allergies, current medications, past  family history, past medical history,  past surgical history, past social history  and problem list.  Visual acuity was not assessed per patient preference since she has regular follow up with her ophthalmologist. Hearing and body mass index were assessed and reviewed.   During the course of the visit the patient was educated and counseled about appropriate screening and preventive services including : fall prevention , diabetes screening, nutrition counseling, colorectal cancer screening, and recommended immunizations.     Objective:  BP 168/84  Pulse 58  Temp(Src) 98.2 F (36.8 C) (Oral)  Resp 14  Ht 5\' 3"  (1.6 m)  Wt 172 lb 4 oz (78.132 kg)  BMI 30.52 kg/m2  SpO2 99%  General Appearance:    Alert, cooperative, no distress, appears stated age  Head:    Normocephalic, without obvious abnormality, atraumatic  Eyes:    PERRL, conjunctiva/corneas clear, EOM's intact, fundi    benign, both eyes  Ears:    Normal TM's and external ear canals, both ears  Nose:   Nares normal, septum midline, mucosa normal, no drainage    or sinus tenderness  Throat:   Lips, mucosa, and tongue normal; teeth and gums normal  Neck:   Supple, symmetrical, trachea midline, no adenopathy;    thyroid:  no enlargement/tenderness/nodules; no carotid   bruit or JVD  Back:     Symmetric, no curvature, ROM normal, no CVA tenderness  Lungs:     Clear to auscultation bilaterally, respirations unlabored  Chest Wall:    No tenderness or deformity   Heart:    Regular  rate and rhythm, S1 and S2 normal, no murmur, rub   or gallop  Breast Exam:    No tenderness, masses, or nipple abnormality  Abdomen:     Soft, non-tender, bowel sounds active all four quadrants,    no masses, no organomegaly  Genitalia:    Pelvic: cervix normal in appearance, external genitalia normal, no adnexal masses or tenderness, no cervical motion tenderness, rectovaginal septum normal, uterus normal size, shape, and consistency and vagina normal  without discharge  Extremities:   Extremities normal, atraumatic, no cyanosis or edema  Pulses:   2+ and symmetric all extremities  Skin:   Skin color, texture, turgor normal, no rashes or lesions  Lymph nodes:   Cervical, supraclavicular, and axillary nodes normal  Neurologic:   CNII-XII intact, normal strength, sensation and reflexes    throughout    Assessment and Plan:  Routine general medical examination at a health care facility Annual comprehensive exam was done including breast and pelvic without PAP smear. All screenings have been addressed and updated. .   Hypertension Well controlled on current regimen. Renal function stable, no changes today.   Updated Medication List Outpatient Encounter Prescriptions as of 04/02/2013  Medication Sig Dispense Refill  . amLODipine (NORVASC) 5 MG tablet Take 1 tablet (5 mg total) by mouth daily.  90 tablet  1  . metoprolol succinate (TOPROL-XL) 50 MG 24 hr tablet Take 1 tablet (50 mg total) by mouth daily. Take with or immediately following a meal.  90 tablet  3  . simvastatin (ZOCOR) 20 MG tablet Take 1 tablet (20 mg total) by mouth every evening.  90 tablet  1   No facility-administered encounter medications on file as of 04/02/2013.

## 2013-04-02 NOTE — Patient Instructions (Signed)
I recommend that you have a Bone density test this year.  We will schedule it some time in August  You had your annual Medicare wellness exam today.  Your pelvic exam was normal

## 2013-04-03 NOTE — Assessment & Plan Note (Signed)
Well controlled on current regimen. Renal function stable, no changes today. 

## 2013-04-03 NOTE — Assessment & Plan Note (Signed)
Annual comprehensive exam was done including breast and pelvic without PAP smear. All screenings have been addressed and updated. Marland Kitchen

## 2013-04-17 ENCOUNTER — Telehealth: Payer: Self-pay | Admitting: Internal Medicine

## 2013-04-17 DIAGNOSIS — R9389 Abnormal findings on diagnostic imaging of other specified body structures: Secondary | ICD-10-CM

## 2013-04-17 NOTE — Telephone Encounter (Signed)
Received a report on a chest x ray done at [facility] July  2013 that was abnormal and should have been repeated to  Document clearing of the patch density at the left lung base.  Was this ever doneb? If not.,  I want her to have it done at [facility] and will place the order.

## 2013-04-19 NOTE — Telephone Encounter (Signed)
Left message for patient to return call to office with family member.

## 2013-04-20 NOTE — Telephone Encounter (Signed)
Patient is out of town for a week or more, once she returns she will call back then.

## 2013-04-27 ENCOUNTER — Encounter: Payer: Self-pay | Admitting: Family Medicine

## 2013-04-30 ENCOUNTER — Encounter: Payer: Self-pay | Admitting: Family Medicine

## 2013-04-30 ENCOUNTER — Encounter: Payer: Self-pay | Admitting: Internal Medicine

## 2013-05-10 ENCOUNTER — Ambulatory Visit (INDEPENDENT_AMBULATORY_CARE_PROVIDER_SITE_OTHER): Payer: Medicare Other | Admitting: Internal Medicine

## 2013-05-10 ENCOUNTER — Encounter: Payer: Self-pay | Admitting: Internal Medicine

## 2013-05-10 ENCOUNTER — Ambulatory Visit (INDEPENDENT_AMBULATORY_CARE_PROVIDER_SITE_OTHER)
Admission: RE | Admit: 2013-05-10 | Discharge: 2013-05-10 | Disposition: A | Discharge status: Home / Self Care | Payer: Medicare Other | Source: Ambulatory Visit | Attending: Internal Medicine | Admitting: Internal Medicine

## 2013-05-10 VITALS — BP 164/82 | HR 60 | Temp 98.4°F | Resp 12 | Wt 174.5 lb

## 2013-05-10 DIAGNOSIS — R9389 Abnormal findings on diagnostic imaging of other specified body structures: Secondary | ICD-10-CM

## 2013-05-10 DIAGNOSIS — R918 Other nonspecific abnormal finding of lung field: Secondary | ICD-10-CM

## 2013-05-10 DIAGNOSIS — I10 Essential (primary) hypertension: Secondary | ICD-10-CM

## 2013-05-10 DIAGNOSIS — Z1239 Encounter for other screening for malignant neoplasm of breast: Secondary | ICD-10-CM

## 2013-05-10 NOTE — Progress Notes (Signed)
Patient ID: Tiffany Benjamin, female   DOB: 12/26/1932, 79 y.o.   MRN: [id number]   Patient Active Problem List   Diagnosis Date Noted  . Abnormal chest x-ray 05/12/2013  . Routine general medical examination at a health care facility 04/02/2013  . Arthritis   . Hypertension   . Hyperlipidemia   . History of colonic polyps     Subjective:  CC:   Chief Complaint  Patient presents with  . Follow-up    HPI:   Tiffany Benjamin a 79 y.o. female who presents for followup on multiple medical issues including Follow up on abnormal chest x ray done July 2013 at [facility] clinic , and uncontrolled hypertension  noted at last visit.  1) Abnormal CXR:  Patient was treated for PNA  last July at [place], symptoms resolved but no follow up cxr was done to confirm resolution of  patchy right sided infiltrate.  2) HTN: patient is compliant with amlodipine and toprol.  Patient checks bp regularly at home with 79 yr old home kit and 140/80 is usual reading , bilaterally.   She does not take aleve or motrin,  Only tylenol For knee pain.  She does snore and wakes up frequently but has never had a sleep study .  Past Medical History  Diagnosis Date  . History of chicken pox   . Depression   . Ulcer   . Family history of polyps in the colon   . UTI (urinary tract infection)   . Arthritis   . Hypertension   . Hyperlipidemia   . History of colonic polyps     Past Surgical History  Procedure Laterality Date  . Breast surgery Right 2001    bernign lumpectomy        The following portions of the patient's history were reviewed and updated as appropriate: Allergies, current medications, and problem list.    Review of Systems:   Patient denies headache, fevers, malaise, unintentional weight loss, skin rash, eye pain, sinus congestion and sinus pain, sore throat, dysphagia,  hemoptysis , cough, dyspnea, wheezing, chest pain, palpitations, orthopnea, edema, abdominal pain, nausea, melena,  diarrhea, constipation, flank pain, dysuria, hematuria, urinary  Frequency, nocturia, numbness, tingling, seizures,  Focal weakness, Loss of consciousness,  Tremor, insomnia, depression, anxiety, and suicidal ideation.     History   Social History  . Marital Status: Widowed    Spouse Name: N/A    Number of Children: N/A  . Years of Education: N/A   Occupational History  . Not on file.   Social History Main Topics  . Smoking status: Never Smoker   . Smokeless tobacco: Not on file  . Alcohol Use: 1.8 oz/week    3 Glasses of wine per week     Comment: occasional  . Drug Use: No  . Sexually Active: Not on file   Other Topics Concern  . Not on file   Social History Narrative   Social:  Her husband died about 20 years ago from throat cancer.  She then cared for her father for  20 yrs in her home before he passed.    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 Likes to stay busy.  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   Exercises regularly, goes to [employer] 3 days a week and stretches before hand.          Objective:  Filed Vitals:  05/10/13 1133  BP: 164/82  Pulse: 60  Temp:   Resp:      General appearance: alert, cooperative and appears stated age Ears: normal TM's and external ear canals both ears Throat: lips, mucosa, and tongue normal; teeth and gums normal Neck: no adenopathy, no carotid bruit, supple, symmetrical, trachea midline and thyroid not enlarged, symmetric, no tenderness/mass/nodules Back: symmetric, no curvature. ROM normal. No CVA tenderness. Lungs: clear to auscultation bilaterally Heart: regular rate and rhythm, S1, S2 normal, no murmur, click, rub or gallop Abdomen: soft, non-tender; bowel sounds normal; no masses,  no organomegaly Pulses: 2+ and symmetric Skin: Skin color, texture, turgor normal. No rashes or lesions Lymph nodes: Cervical, supraclavicular, and axillary nodes  normal.  Assessment and Plan:  Hypertension Still elevated on repeat exam today. Given her age and her normal readings at home I have elected not to change her medications today but to have her return with her home blood pressure cuff. If readings are simultaneously in agreement , I will trust her  home measurements and avoidi making adjustments based on readings elevated by "white coat syndrome."  Abnormal chest x-ray She was referred to the [facility] office for evaluation of previously noted patchy airspace disease in July 2013. PA and lateral chest x-ray which was reported as rmal.    Updated Medication List Outpatient Encounter Prescriptions as of 05/10/2013  Medication Sig Dispense Refill  . metoprolol succinate (TOPROL-XL) 50 MG 24 hr tablet Take 1 tablet (50 mg total) by mouth daily. Take with or immediately following a meal.  90 tablet  3  . [DISCONTINUED] amLODipine (NORVASC) 5 MG tablet Take 1 tablet (5 mg total) by mouth daily.  90 tablet  1  . [DISCONTINUED] simvastatin (ZOCOR) 20 MG tablet Take 1 tablet (20 mg total) by mouth every evening.  90 tablet  1   No facility-administered encounter medications on file as of 05/10/2013.     Orders Placed This Encounter  Procedures  . MM Digital Screening    No Follow-up on file.

## 2013-05-10 NOTE — Assessment & Plan Note (Addendum)
Still elevated on repeat exam today. Given her age and her normal readings at home I have elected not to change her medications today but to have her return with her home blood pressure cuff. If readings are simultaneously in agreement , I will trust her  home measurements and avoidi making adjustments based on readings elevated by "white coat syndrome."

## 2013-05-10 NOTE — Patient Instructions (Addendum)
Please bring your home BP cuff to the office ASAP for a comparison reading  To see if your elevated blood pressue is due to anxieyt  Get your chest x ray today at [facility] to be set up in the morning hours by [facility]

## 2013-05-11 ENCOUNTER — Telehealth: Payer: Self-pay | Admitting: Internal Medicine

## 2013-05-11 MED ORDER — AMLODIPINE BESYLATE 5 MG PO TABS: 5.0000 mg | ORAL_TABLET | Freq: Every day | ORAL | Status: DC

## 2013-05-11 MED ORDER — SIMVASTATIN 20 MG PO TABS: 20.0000 mg | ORAL_TABLET | Freq: Every evening | ORAL | Status: DC

## 2013-05-11 NOTE — Telephone Encounter (Signed)
Says at appt yesterday she was to have renewal for scripts for amlodipine and simvastatin but these were not sent over after her appt.  Asking for call when these are ready.  Pharmacy [facility].

## 2013-05-11 NOTE — Telephone Encounter (Signed)
Refills sent patient notified

## 2013-05-12 ENCOUNTER — Encounter: Payer: Self-pay | Admitting: Internal Medicine

## 2013-05-12 DIAGNOSIS — R9389 Abnormal findings on diagnostic imaging of other specified body structures: Secondary | ICD-10-CM | POA: Insufficient documentation

## 2013-05-12 NOTE — Assessment & Plan Note (Signed)
She was referred to the [facility] office for evaluation of previously noted patchy airspace disease in July 2013. PA and lateral chest x-ray which was reported as rmal.

## 2013-07-06 ENCOUNTER — Ambulatory Visit: Payer: Self-pay | Admitting: Internal Medicine

## 2013-07-10 ENCOUNTER — Telehealth: Payer: Self-pay | Admitting: *Deleted

## 2013-07-10 DIAGNOSIS — E559 Vitamin D deficiency, unspecified: Secondary | ICD-10-CM

## 2013-07-10 DIAGNOSIS — E785 Hyperlipidemia, unspecified: Secondary | ICD-10-CM

## 2013-07-10 DIAGNOSIS — R5381 Other malaise: Secondary | ICD-10-CM

## 2013-07-10 NOTE — Telephone Encounter (Signed)
Pt is coming in for labs tomorrow what labs and dx?  

## 2013-07-11 ENCOUNTER — Other Ambulatory Visit (INDEPENDENT_AMBULATORY_CARE_PROVIDER_SITE_OTHER): Payer: Medicare Other

## 2013-07-11 ENCOUNTER — Ambulatory Visit (INDEPENDENT_AMBULATORY_CARE_PROVIDER_SITE_OTHER): Payer: Medicare Other | Admitting: *Deleted

## 2013-07-11 DIAGNOSIS — Z23 Encounter for immunization: Secondary | ICD-10-CM

## 2013-07-11 DIAGNOSIS — E785 Hyperlipidemia, unspecified: Secondary | ICD-10-CM

## 2013-07-11 DIAGNOSIS — R5381 Other malaise: Secondary | ICD-10-CM

## 2013-07-11 LAB — CBC WITH DIFFERENTIAL/PLATELET
Eosinophils Absolute: 0.1 10*3/uL (ref 0.0–0.7)
Eosinophils Relative: 2 % (ref 0.0–5.0)
Lymphocytes Relative: 42.2 % (ref 12.0–46.0)
MCV: 96.8 fl (ref 78.0–100.0)
Monocytes Absolute: 0.5 10*3/uL (ref 0.1–1.0)
Neutrophils Relative %: 45.5 % (ref 43.0–77.0)
Platelets: 255 10*3/uL (ref 150.0–400.0)
WBC: 4.7 10*3/uL (ref 4.5–10.5)

## 2013-07-11 LAB — LIPID PANEL
HDL: 87.8 mg/dL (ref >39.00)
LDL Cholesterol: 79 mg/dL (ref 0–99)
Total CHOL/HDL Ratio: 2

## 2013-07-11 LAB — TSH: TSH: 0.82 u[IU]/mL (ref 0.35–5.50)

## 2013-07-21 ENCOUNTER — Encounter: Payer: Self-pay | Admitting: Internal Medicine

## 2013-09-03 ENCOUNTER — Ambulatory Visit: Payer: Medicare Other | Admitting: Adult Health

## 2013-09-05 ENCOUNTER — Ambulatory Visit (INDEPENDENT_AMBULATORY_CARE_PROVIDER_SITE_OTHER): Payer: Medicare Other | Admitting: Adult Health

## 2013-09-05 ENCOUNTER — Encounter (INDEPENDENT_AMBULATORY_CARE_PROVIDER_SITE_OTHER): Payer: Self-pay

## 2013-09-05 ENCOUNTER — Encounter: Payer: Self-pay | Admitting: Adult Health

## 2013-09-05 VITALS — BP 142/80 | HR 81 | Resp 14 | Wt 175.0 lb

## 2013-09-05 DIAGNOSIS — H5789 Other specified disorders of eye and adnexa: Secondary | ICD-10-CM | POA: Insufficient documentation

## 2013-09-05 NOTE — Assessment & Plan Note (Signed)
Consistent with broken vessel. No visual disturbances or pain. Patient's last exam was August 2013. She will contact [facility] for her yearly exam. Reassured patient that what she has is a common occurrence.  Instructed to report pain, flashes of light or any other visual disturbance immediately.

## 2013-09-05 NOTE — Progress Notes (Signed)
Pre visit review using our clinic review tool, if applicable. No additional management support is needed unless otherwise documented below in the visit note. 

## 2013-09-05 NOTE — Progress Notes (Signed)
   Subjective:    Patient ID: Meriel Pica, female    DOB: [birth year], 79 y.o.   MRN: [id number]  HPI  Patient is a pleasant 79 year old female who presents to clinic with redness of her right eye. She denies pain, visual disturbances or a sensation that something is in her eye. She reports that the redness has significantly improved but that she wanted to have it evaluated. Last eye exam was August 2013.  Review of Systems  Eyes: Positive for redness. Negative for photophobia, pain, discharge, itching and visual disturbance.       Objective:   Physical Exam  Constitutional: She is oriented to person, place, and time.  Eyes: Pupils are equal, round, and reactive to light.  Right eye erythema.   Neurological: She is alert and oriented to person, place, and time.  Psychiatric: She has a normal mood and affect. Her behavior is normal. Judgment and thought content normal.          Assessment & Plan:

## 2013-10-03 ENCOUNTER — Encounter: Payer: Self-pay | Admitting: Internal Medicine

## 2013-10-03 ENCOUNTER — Ambulatory Visit (INDEPENDENT_AMBULATORY_CARE_PROVIDER_SITE_OTHER): Payer: Medicare Other | Admitting: Internal Medicine

## 2013-10-03 VITALS — BP 200/90 | HR 68 | Temp 98.0°F | Wt 172.0 lb

## 2013-10-03 DIAGNOSIS — M25561 Pain in right knee: Secondary | ICD-10-CM

## 2013-10-03 DIAGNOSIS — M25569 Pain in unspecified knee: Secondary | ICD-10-CM

## 2013-10-03 DIAGNOSIS — I10 Essential (primary) hypertension: Secondary | ICD-10-CM

## 2013-10-03 MED ORDER — TRAMADOL HCL 50 MG PO TABS: 50.0000 mg | ORAL_TABLET | Freq: Three times a day (TID) | ORAL | Status: DC | PRN

## 2013-10-03 NOTE — Progress Notes (Signed)
Pre visit review using our clinic review tool, if applicable. No additional management support is needed unless otherwise documented below in the visit note. 

## 2013-10-03 NOTE — Patient Instructions (Addendum)
Your blood pressure is high because of the Aleve you took today.    Please take another 5 mg of amlodipine when you go home,  And NO MORE ALEVE OR MOTRIN.  They both can raise blood pressures Check BP in the morning and if > 180 /90 take 10 mg of amlodipine tomorrow as well .,  Continue your toprol  You should use tylenol and tramadol for pain ,  Every 6 hours as needed,  Together or alternating ok.   Maximum dose of tylenol is 2000 mg  Daily  you can use up to 3 tramadol daily  Suspend the treadmill and bike for 2 weeks   If the knees are no better we will try a steroid injection in the knees

## 2013-10-03 NOTE — Progress Notes (Signed)
Patient ID: Tiffany Benjamin, female   DOB: 10-31-1932, 79 y.o.   MRN: [id number]   Patient Active Problem List   Diagnosis Date Noted  . Knee pain, bilateral 10/04/2013  . Redness of eye, right 09/05/2013  . Abnormal chest x-ray 05/12/2013  . Routine general medical examination at a health care facility 04/02/2013  . Arthritis   . Hypertension   . Hyperlipidemia   . History of colonic polyps     Subjective:  CC:   Chief Complaint  Patient presents with  . Acute Visit    bilateral knee pain x 2days. pt states thinks she has arthirits in knees . worst when bearing weight  / walking. pt states takes a tyenol helps alittle with pain    HPI:   Tiffany Benjamin a 79 y.o. female who presents Knee pain bilateral which started a week ago.  She had been exercsising regularly at [employer] until 2 or 3 weeks ago .  Kness began  started aching,    Worse with weigft bearing .  Was told years ago by former PCP that she had DJD  Of both knees and would knee TKR eventually.  Had a steroid injection over a year ago which helped.  bp sky  high.  Took 2 aleve this mornign bc tylenol was not helping her knee pain .  Denies headache, chdest pain and dyspnea,    Past Medical History  Diagnosis Date  . History of chicken pox   . Depression   . Ulcer   . Family history of polyps in the colon   . UTI (urinary tract infection)   . Arthritis   . Hypertension   . Hyperlipidemia   . History of colonic polyps     Past Surgical History  Procedure Laterality Date  . Breast surgery Right 2001    bernign lumpectomy        The following portions of the patient's history were reviewed and updated as appropriate: Allergies, current medications, and problem list.    Review of Systems:   12 Pt  review of systems was negative except those addressed in the HPI,     History   Social History  . Marital Status: Widowed    Spouse Name: N/A    Number of Children: N/A  . Years of Education: N/A    Occupational History  . Not on file.   Social History Main Topics  . Smoking status: Never Smoker   . Smokeless tobacco: Not on file  . Alcohol Use: 1.8 oz/week    3 Glasses of wine per week     Comment: occasional  . Drug Use: No  . Sexual Activity: Not on file   Other Topics Concern  . Not on file   Social History Narrative   Social:  Her husband died about 20 years ago from throat cancer.  She then cared for her father for  20 yrs in her home before he passed.    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   Exercises regularly, goes to [employer] 3 days a week and stretches before hand.          Objective:  Filed Vitals:   10/03/13 1240  BP: 200/90  Pulse: 68  Temp: 98 F (36.7 C)     General appearance: alert, cooperative and appears stated  age Ears: normal TM's and external ear canals both ears Throat: lips, mucosa, and tongue normal; teeth and gums normal Neck: no adenopathy, no carotid bruit, supple, symmetrical, trachea midline and thyroid not enlarged, symmetric, no tenderness/mass/nodules Back: symmetric, no curvature. ROM normal. No CVA tenderness. Lungs: clear to auscultation bilaterally Heart: regular rate and rhythm, S1, S2 normal, no murmur, click, rub or gallop Abdomen: soft, non-tender; bowel sounds normal; no masses,  no organomegaly Pulses: 2+ and symmetric Skin: Skin color, texture, turgor normal. No rashes or lesions Lymph nodes: Cervical, supraclavicular, and axillary nodes normal.  MSK: bilateral knee crepius without effusion noted.   Assessment and Plan:  Knee pain, bilateral Exam suggests DJD of both knees aggravated by activities at [employer].  Recommended aboidanace of NSAIDS due to effect on her BP,  Tramadol and tylenol,  Suspend treadmill and stair activities,  And ice.  Return for steroid injections if prior measures do  not help  Hypertension Uncontrolled today likely due to use of NSAIDs.  Increase amlodipine to 10 mg daily for now. NSAIDS stopped   Updated Medication List Outpatient Encounter Prescriptions as of 10/03/2013  Medication Sig  . amLODipine (NORVASC) 5 MG tablet Take 1 tablet (5 mg total) by mouth daily.  Marland Kitchen aspirin 81 MG tablet Take 81 mg by mouth daily.  . metoprolol succinate (TOPROL-XL) 50 MG 24 hr tablet Take 1 tablet (50 mg total) by mouth daily. Take with or immediately following a meal.  . simvastatin (ZOCOR) 20 MG tablet Take 1 tablet (20 mg total) by mouth every evening.  . traMADol (ULTRAM) 50 MG tablet Take 1 tablet (50 mg total) by mouth every 8 (eight) hours as needed.     No orders of the defined types were placed in this encounter.    No Follow-up on file.

## 2013-10-04 DIAGNOSIS — M25562 Pain in left knee: Principal | ICD-10-CM

## 2013-10-04 DIAGNOSIS — M25561 Pain in right knee: Secondary | ICD-10-CM | POA: Insufficient documentation

## 2013-10-04 HISTORY — DX: Pain in right knee: M25.562

## 2013-10-04 HISTORY — DX: Pain in right knee: M25.561

## 2013-10-04 NOTE — Assessment & Plan Note (Signed)
Uncontrolled today likely due to use of NSAIDs.  Increase amlodipine to 10 mg daily for now. NSAIDS stopped

## 2013-10-04 NOTE — Assessment & Plan Note (Signed)
Exam suggests DJD of both knees aggravated by activities at [employer].  Recommended aboidanace of NSAIDS due to effect on her BP,  Tramadol and tylenol,  Suspend treadmill and stair activities,  And ice.  Return for steroid injections if prior measures do not help

## 2013-11-03 ENCOUNTER — Other Ambulatory Visit: Payer: Self-pay | Admitting: Internal Medicine

## 2013-11-23 ENCOUNTER — Other Ambulatory Visit: Payer: Self-pay | Admitting: Internal Medicine

## 2014-01-28 ENCOUNTER — Telehealth: Payer: Self-pay | Admitting: Internal Medicine

## 2014-01-28 NOTE — Telephone Encounter (Signed)
Patient Information:  Caller Name: [name]  Phone: [phone]  Patient: Tiffany Benjamin, Tiffany Benjamin  Gender: Female  DOB: [birth year]  Age: 80 Years  PCP: Duncan Dullullo, Teresa (Adults only)  Office Follow Up:  Does the office need to follow up with this patient?: No  Instructions For The Office: N/A   Symptoms  Reason For Call & Symptoms: Constant burning pain in both lower legs from knees to ankles for past 2 weeks. Pain is relieved by Tylenol but comes back. No swelling and no localized area of pain. No color change or warmth in legs.   Reviewed Health History In EMR: Yes  Reviewed Medications In EMR: Yes  Reviewed Allergies In EMR: Yes  Reviewed Surgeries / Procedures: Yes  Date of Onset of Symptoms: 01/14/2014  Treatments Tried: Tylenol  ES 2 tabs PO QD  Treatments Tried Worked: Yes  Guideline(s) Used:  Leg Pain  Disposition Per Guideline:   See Within 3 Days in Office  Reason For Disposition Reached:   Moderate pain (e.g., interferes with normal activities, limping) and present > 3 days  Advice Given:  Reassurance - Leg Pain  Usually leg pain is not serious. You have told me that there is no redness, numbness, or swelling.  Causes of leg pain can include a strained muscle, a forgotten minor injury, and tendinitis.  Here is some care advice that should help.  Pain Medicines:  For pain relief, you can take either acetaminophen, ibuprofen, or naproxen.  Acetaminophen (e.g., Tylenol):  Extra Strength Tylenol: Take 1,000 mg (two 500 mg pills) every 8 hours as needed. Each Extra Strength Tylenol pill has 500 mg of acetaminophen.  Call Back If:  Moderate pain (e.g., limping) lasts more than 3 days  Signs of infection occur (e.g., spreading redness, warmth, fever)  You become worse.  Patient Will Follow Care Advice:  YES  Appointment Scheduled:  01/30/2014 10:45:00 Appointment Scheduled Provider:  Orville Governey, Raquel

## 2014-01-28 NOTE — Telephone Encounter (Signed)
Appt with Raquel on 01/30/14

## 2014-01-30 ENCOUNTER — Ambulatory Visit (INDEPENDENT_AMBULATORY_CARE_PROVIDER_SITE_OTHER): Payer: Commercial Managed Care - HMO | Admitting: Adult Health

## 2014-01-30 ENCOUNTER — Encounter: Payer: Self-pay | Admitting: Adult Health

## 2014-01-30 VITALS — BP 162/88 | HR 58 | Temp 98.2°F | Resp 14 | Wt 172.2 lb

## 2014-01-30 DIAGNOSIS — M25562 Pain in left knee: Principal | ICD-10-CM

## 2014-01-30 DIAGNOSIS — M25561 Pain in right knee: Secondary | ICD-10-CM

## 2014-01-30 DIAGNOSIS — M25569 Pain in unspecified knee: Secondary | ICD-10-CM

## 2014-01-30 NOTE — Patient Instructions (Addendum)
  Continue taking the tramadol for your pain.  I am referring you to Dr. Dallas Schimkeopeland to discuss possible injection of both knees.  We will call you with an appointment.  If you have not heard back from out office by tomorrow at midday, call and ask for [place].

## 2014-01-30 NOTE — Progress Notes (Signed)
Pre visit review using our clinic review tool, if applicable. No additional management support is needed unless otherwise documented below in the visit note. 

## 2014-01-30 NOTE — Progress Notes (Signed)
   Subjective:    Patient ID: Tiffany Benjamin, female    DOB: Apr 26, 1933, 80 y.o.   MRN: [id number]  HPI Bilateral knee pain. Worse with walking. She reports burning sensation while sleeping. She reports the pain is slowing her down. She was prescribed tramadol and takes this as needed. She takes tramadol once daily in the afternoon. She has seen an orthopedic surgeon ~ 10 years ago and was told she needed surgery. She received an injection in both knees and did well until recently. She is not interested in surgery. She is interested in getting an injection again for some relief.  Past Medical History  Diagnosis Date  . History of chicken pox   . Depression   . Ulcer   . Family history of polyps in the colon   . UTI (urinary tract infection)   . Arthritis   . Hypertension   . Hyperlipidemia   . History of colonic polyps    Current Outpatient Prescriptions on File Prior to Visit  Medication Sig Dispense Refill  . amLODipine (NORVASC) 5 MG tablet TAKE 1 TABLET (5 MG TOTAL) BY MOUTH DAILY.  90 tablet  1  . aspirin 81 MG tablet Take 81 mg by mouth daily.      . metoprolol succinate (TOPROL-XL) 50 MG 24 hr tablet TAKE 1 TABLET BY MOUTH DAILY. TAKE WITH OR IMMEDIATELY FOLLOWING A MEAL.  90 tablet  1  . simvastatin (ZOCOR) 20 MG tablet TAKE 1 TABLET (20 MG TOTAL) BY MOUTH EVERY EVENING.  90 tablet  1  . traMADol (ULTRAM) 50 MG tablet Take 1 tablet (50 mg total) by mouth every 8 (eight) hours as needed.  90 tablet  3   No current facility-administered medications on file prior to visit.     Review of Systems  Musculoskeletal: Positive for arthralgias (bilateral Knee pain - worse with walking), gait problem and joint swelling.  All other systems reviewed and are negative.      Objective:   Physical Exam  Constitutional: She is oriented to person, place, and time. No distress.  HENT:  Head: Normocephalic and atraumatic.  Eyes: Conjunctivae and EOM are normal.  Neck: Normal range of  motion. Neck supple.  Cardiovascular: Normal rate, regular rhythm and intact distal pulses.   Pulmonary/Chest: Effort normal. No respiratory distress.  Musculoskeletal: She exhibits edema. She exhibits no tenderness.  Bilateral knees with slight swelling. No pain with palpation. Crepitus  Neurological: She is alert and oriented to person, place, and time. She has normal reflexes. Coordination normal.  Skin: Skin is warm and dry.  Psychiatric: She has a normal mood and affect. Her behavior is normal. Judgment and thought content normal.      Assessment & Plan:   1. Bilateral knee pain Has hx of arthritis bilateral knees. She has had injections in both knees with good results. I will refer her to Dr. Dallas Schimkeopeland to discuss options to see if she can obtain some relief. - Ambulatory referral to Orthopedic Surgery

## 2014-02-04 ENCOUNTER — Ambulatory Visit (INDEPENDENT_AMBULATORY_CARE_PROVIDER_SITE_OTHER): Payer: Commercial Managed Care - HMO | Admitting: Internal Medicine

## 2014-02-04 DIAGNOSIS — M25569 Pain in unspecified knee: Secondary | ICD-10-CM

## 2014-02-05 NOTE — Progress Notes (Signed)
Patient ID: Tiffany Benjamin, female   DOB: 04/14/1933, 80 y.o.   MRN: [id number] Patient no showed for appt.

## 2014-02-12 ENCOUNTER — Telehealth: Payer: Self-pay | Admitting: *Deleted

## 2014-02-12 NOTE — Telephone Encounter (Signed)
Patient called up-set about a no show charge talked with Carollee HerterShannon and was advised that the no show fee would be waved. Returned call to patient and advised that the fee was to be waved and patient voiced understanding.

## 2014-02-18 ENCOUNTER — Ambulatory Visit: Payer: Commercial Managed Care - HMO | Admitting: Family Medicine

## 2014-03-04 ENCOUNTER — Ambulatory Visit (INDEPENDENT_AMBULATORY_CARE_PROVIDER_SITE_OTHER)
Admission: RE | Admit: 2014-03-04 | Discharge: 2014-03-04 | Disposition: A | Discharge status: Home / Self Care | Payer: Commercial Managed Care - HMO | Source: Ambulatory Visit | Attending: Family Medicine | Admitting: Family Medicine

## 2014-03-04 ENCOUNTER — Encounter: Payer: Self-pay | Admitting: Family Medicine

## 2014-03-04 ENCOUNTER — Ambulatory Visit (INDEPENDENT_AMBULATORY_CARE_PROVIDER_SITE_OTHER): Payer: Commercial Managed Care - HMO | Admitting: Family Medicine

## 2014-03-04 ENCOUNTER — Ambulatory Visit
Admission: RE | Admit: 2014-03-04 | Discharge: 2014-03-04 | Disposition: A | Discharge status: Home / Self Care | Payer: Commercial Managed Care - HMO | Source: Ambulatory Visit | Attending: Family Medicine | Admitting: Family Medicine

## 2014-03-04 VITALS — BP 140/90 | HR 65 | Temp 98.6°F | Ht 61.0 in | Wt 171.5 lb

## 2014-03-04 DIAGNOSIS — M1710 Unilateral primary osteoarthritis, unspecified knee: Secondary | ICD-10-CM

## 2014-03-04 DIAGNOSIS — IMO0002 Reserved for concepts with insufficient information to code with codable children: Secondary | ICD-10-CM

## 2014-03-04 DIAGNOSIS — M25561 Pain in right knee: Secondary | ICD-10-CM

## 2014-03-04 DIAGNOSIS — M25562 Pain in left knee: Secondary | ICD-10-CM

## 2014-03-04 DIAGNOSIS — M25569 Pain in unspecified knee: Secondary | ICD-10-CM

## 2014-03-04 NOTE — Progress Notes (Signed)
Pre visit review using our clinic review tool, if applicable. No additional management support is needed unless otherwise documented below in the visit note. 

## 2014-03-04 NOTE — Patient Instructions (Signed)
OSTEOARTHRITIS:  For symptomatic relief: Tylenol: 2 tablets up to 3-4 times a day  Topical Capzaicin Cream, as needed (wear glove to put on)  For flares, corticosteroid injections help. Hyaluronic Acid injections have good success, average relief is 6 months  Glucosamine and Chondroitin often helpful - will take about 3 months to see if you have an effect. If you do, great, keep them up, if none at that point, no need to take in the future.  Omega-3 fish oils may help, 2 grams daily  Ice joints on bad days, 20 min, 2-3 x / day REGULAR EXERCISE: swimming, Yoga, Tai Chi, bicycle (NON-IMPACT activity)  

## 2014-03-04 NOTE — Progress Notes (Signed)
[place] [address] Phone: [phone] Fax: [phone]  Patient ID: Tiffany Benjamin MRN: [id number], DOB: Dec 14, 1932, 80 y.o. Date of Encounter: 03/04/2014  Primary Physician:  Deborra Medina, MD   Chief Complaint: Knee Pain   Subjective:   History of Present Illness:  Tiffany Benjamin is a 80 y.o. very pleasant female patient who presents with the following:  Patient presents with a many year h/o bilateral knee pain after no particular accident. No audible pop was heard. The patient has not had an effusion. No symptomatic giving-way. No mechanical clicking. Joint has not locked up. Patient has been able to walk but is limping sometimes. The patient does have pain going up and down stairs or rising from a seated position.   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  Review of Systems:  GEN: No fevers, chills. Nontoxic. Primarily MSK c/o today. MSK: Detailed in the HPI GI: tolerating PO intake without difficulty Neuro: No numbness, parasthesias, or tingling associated. Otherwise the pertinent positives of the ROS are noted above.   Objective:   Physical Examination: BP 140/90  Pulse 65  Temp(Src) 98.6 F (37 C) (Oral)  Ht _0  (1.549 m)  Wt 171 lb 8 oz (77.792 kg)  BMI 32.42 kg/m2   GEN: WDWN, NAD, Non-toxic, Alert & Oriented x 3 HEENT: Atraumatic, Normocephalic.  Ears and Nose: No external deformity. EXTR: No clubbing/cyanosis/edema PSYCH: Normally interactive. Conversant. Not depressed or anxious appearing.  Calm demeanor.   Knee:  B Gait: Normal heel toe pattern, mild antalgia ROM: +3  to 110 bilaterally Effusion: neg Echymosis or edema: none Patellar tendon NT Painful PLICA: neg Patellar grind: pos, minimal patellar motion Medial and lateral patellar facet loading: negative - essentially cannot be done medial and lateral joint lines:n TTP medially and laterally B Mcmurray's mild pain Flexion-pinch neg Bounce home is negative Varus and valgus stress: stable Lachman: neg Ant and Post drawer: neg Hip abduction, IR, ER: WNL Hip flexion str: 5/5 Hip abd: 5/5 Quad: 5/5 VMO atrophy:No Hamstring concentric and eccentric: 5/5   Radiology: Dg Knee Ap/lat W/sunrise Left  03/04/2014   CLINICAL DATA:  Bilateral knee pain, no known trauma  EXAM: DG KNEE - 3 VIEWS  COMPARISON:  None.  FINDINGS: No fracture or dislocation. There is severe tricompartmental degenerative change of the knee, worse within the medial compartment with near complete joint space loss, subchondral sclerosis and osteophytosis. There is minimal lateral deviation the tibial plateau in relation to the distal femoral condyles. There is spurring of the tibial spines. No evidence of chondrocalcinosis. No joint effusion. Minimal enthesopathic change of the superior pole of the patella. No radiopaque foreign body.  IMPRESSION: 1. No acute findings. 2. Severe tricompartmental degenerative change of the knee, worse within the medial compartment.   Electronically Signed   By: Sandi Mariscal M.D.   On: 03/04/2014 09:46   Dg Knee Ap/lat W/sunrise Right  03/04/2014   CLINICAL DATA:  Bilateral knee pain, no known trauma  EXAM: DG KNEE - 3 VIEWS  COMPARISON:  None.  FINDINGS: No fracture or dislocation. Severe tricompartmental degenerative change of the knee, worse within the medial compartment with joint space loss,  articular surface irregularity, subchondral sclerosis and osteophytosis. There is minimal lateral deviation the tibial plateau in relation to the distal femoral condyles. There is spurring of the tibial spines. No  evidence of chondrocalcinosis. Minimal enthesopathic change of the superior pole of the patella. There is a potential small (approximately 0.5 cm) loose body within the anterior aspect of the knee joint space without definitive donor site. No joint effusion. No radiopaque foreign body.  IMPRESSION: 1. No acute findings. 2. Severe tricompartmental degenerative change of the knee, worse within the medial compartment 3. Possible loose body within the anterior aspect of the knee joint space without discrete donor site.   Electronically Signed   By: Sandi Mariscal M.D.   On: 03/04/2014 09:48    Assessment & Plan:   Tricompartment Severe Knee OA, Bilateral  Knee pain, bilateral - Plan: DG Knee AP/LAT W/Sunrise Left, DG Knee AP/LAT W/Sunrise Right  >25 minutes spent in face to face time with patient, >50% spent in counselling or coordination of care  Severe tricompartmental OA B, but she is fairly functional. Minimal / mild pain today.  Reviewed additional things she can try for conservative care. If more symptomatic, we can always inject her knees with corticosteroids or hyaluronic acid in the future.   Probable loose body on the R does not matter clinically. Nothing short of TKA would make sense in this case, and is doing pretty well with her native knee right now.   Encouraged her to keep up with the strength training and exercise.   I appreciate the opportunity to evaluate this very friendly patient. If you have any question regarding her care or prognosis, do not hesitate to ask.   Patient Instructions  OSTEOARTHRITIS:  For symptomatic relief: Tylenol: 2 tablets up to 3-4 times a day  Topical Capzaicin Cream, as needed (wear glove to put on)  For flares, corticosteroid injections help. Hyaluronic Acid injections have good success, average relief is 6 months  Glucosamine and Chondroitin often helpful - will take about 3 months to see if you have an effect. If you do, great, keep them up, if  none at that point, no need to take in the future.  Omega-3 fish oils may help, 2 grams daily  Ice joints on bad days, 20 min, 2-3 x / day REGULAR EXERCISE: swimming, Yoga, Tai Chi, bicycle (NON-IMPACT activity)      Orders Placed This Encounter  Procedures  . DG Knee AP/LAT W/Sunrise Left  . DG Knee AP/LAT W/Sunrise Right    Signed,  Casimira Sutphin T. Moana Munford, MD, [place]   Patient's Medications  New Prescriptions   No medications on file  Previous Medications   AMLODIPINE (NORVASC) 5 MG TABLET    TAKE 1 TABLET (5 MG TOTAL) BY MOUTH DAILY.   ASPIRIN 81 MG TABLET    Take 81 mg by mouth daily.   METOPROLOL SUCCINATE (TOPROL-XL) 50 MG 24 HR TABLET    TAKE 1 TABLET BY MOUTH DAILY. TAKE WITH OR IMMEDIATELY FOLLOWING A MEAL.   SIMVASTATIN (ZOCOR) 20 MG TABLET    TAKE 1 TABLET (20 MG TOTAL) BY MOUTH EVERY EVENING.   TRAMADOL (ULTRAM) 50 MG TABLET    Take 1 tablet (50 mg total) by mouth every 8 (eight) hours as needed.  Modified Medications   No medications on file  Discontinued Medications   No medications on file

## 2014-03-05 DIAGNOSIS — M1710 Unilateral primary osteoarthritis, unspecified knee: Secondary | ICD-10-CM

## 2014-03-05 HISTORY — DX: Unilateral primary osteoarthritis, unspecified knee: M17.10

## 2014-04-30 ENCOUNTER — Other Ambulatory Visit: Payer: Self-pay | Admitting: Internal Medicine

## 2014-06-19 IMAGING — CR DG KNEE AP/LAT W/ SUNRISE*L*
3 series · 3 of 3 positions shown · non-contrast
Comparison: None.

CLINICAL DATA: Bilateral knee pain, no known trauma

EXAM:
DG KNEE - 3 VIEWS

[view not recorded (1 of 3)]
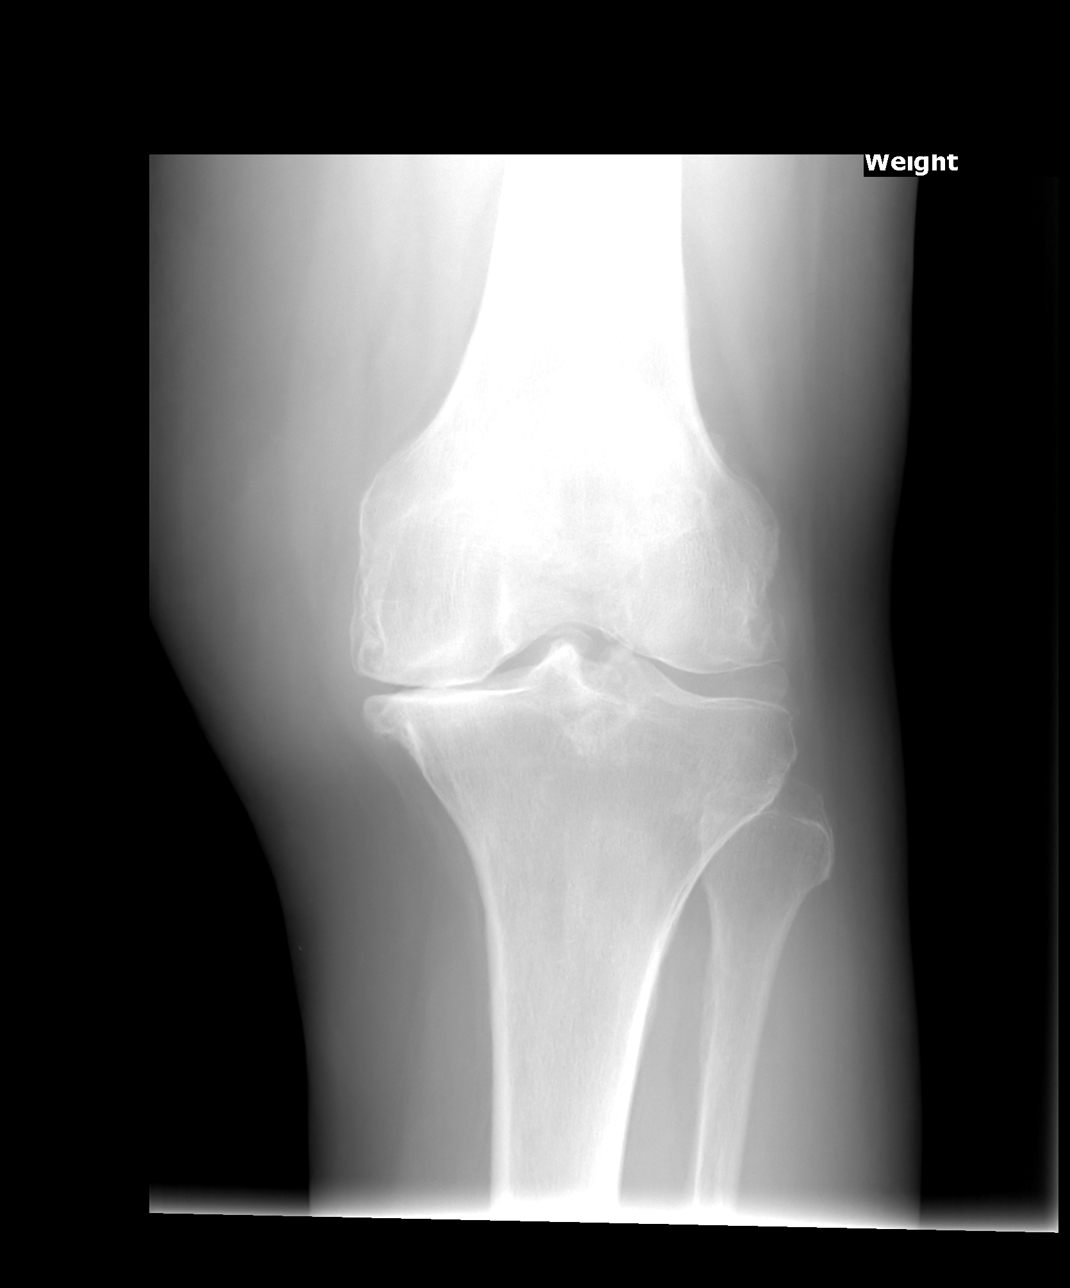

[view not recorded (2 of 3)]
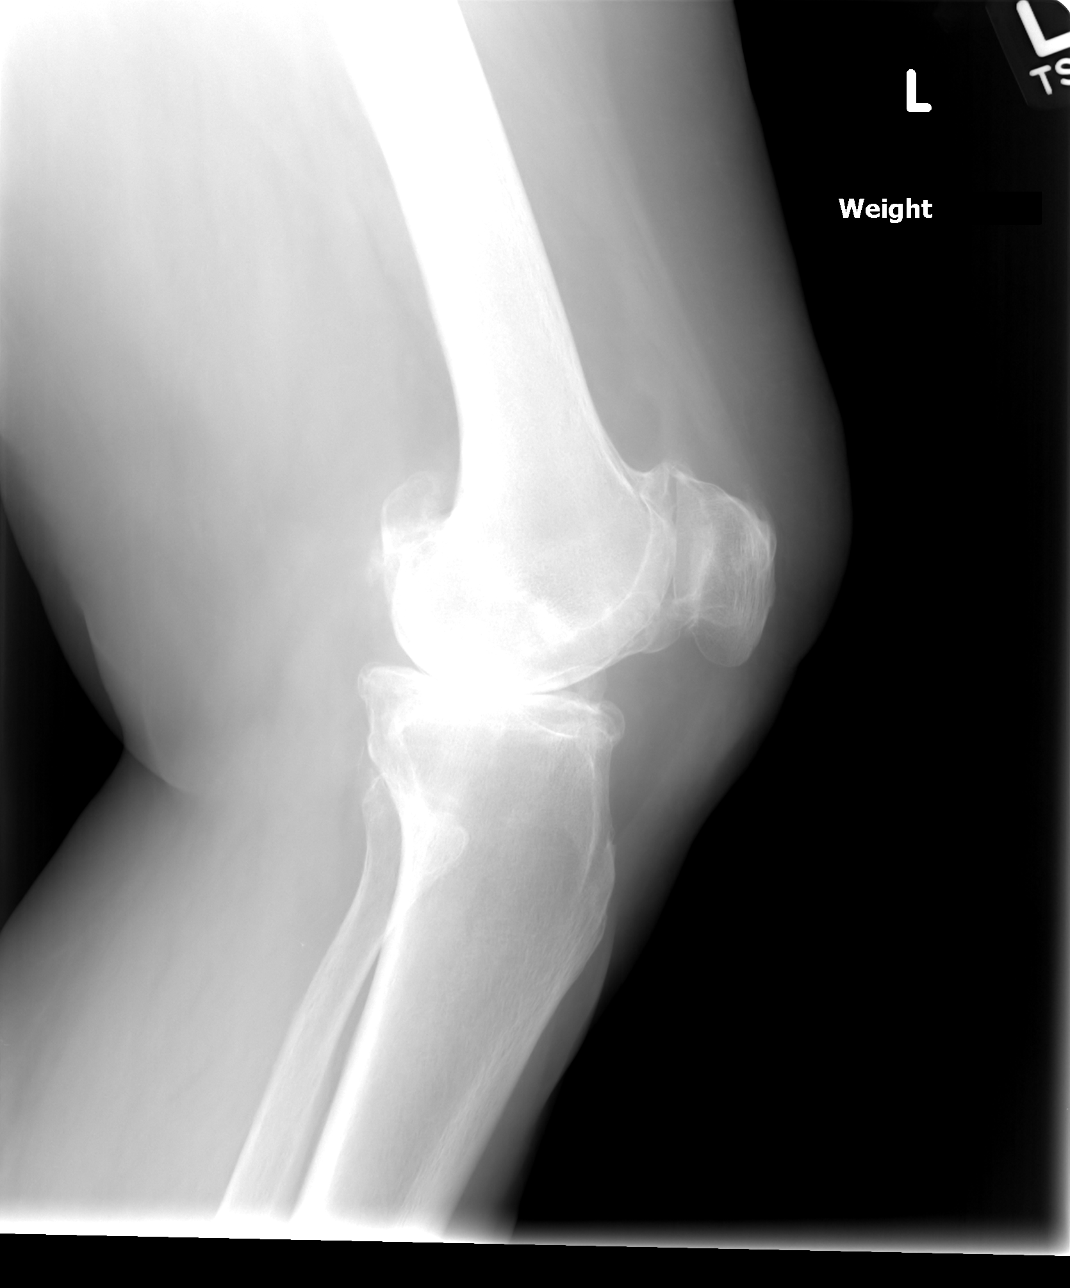

[view not recorded (3 of 3)]
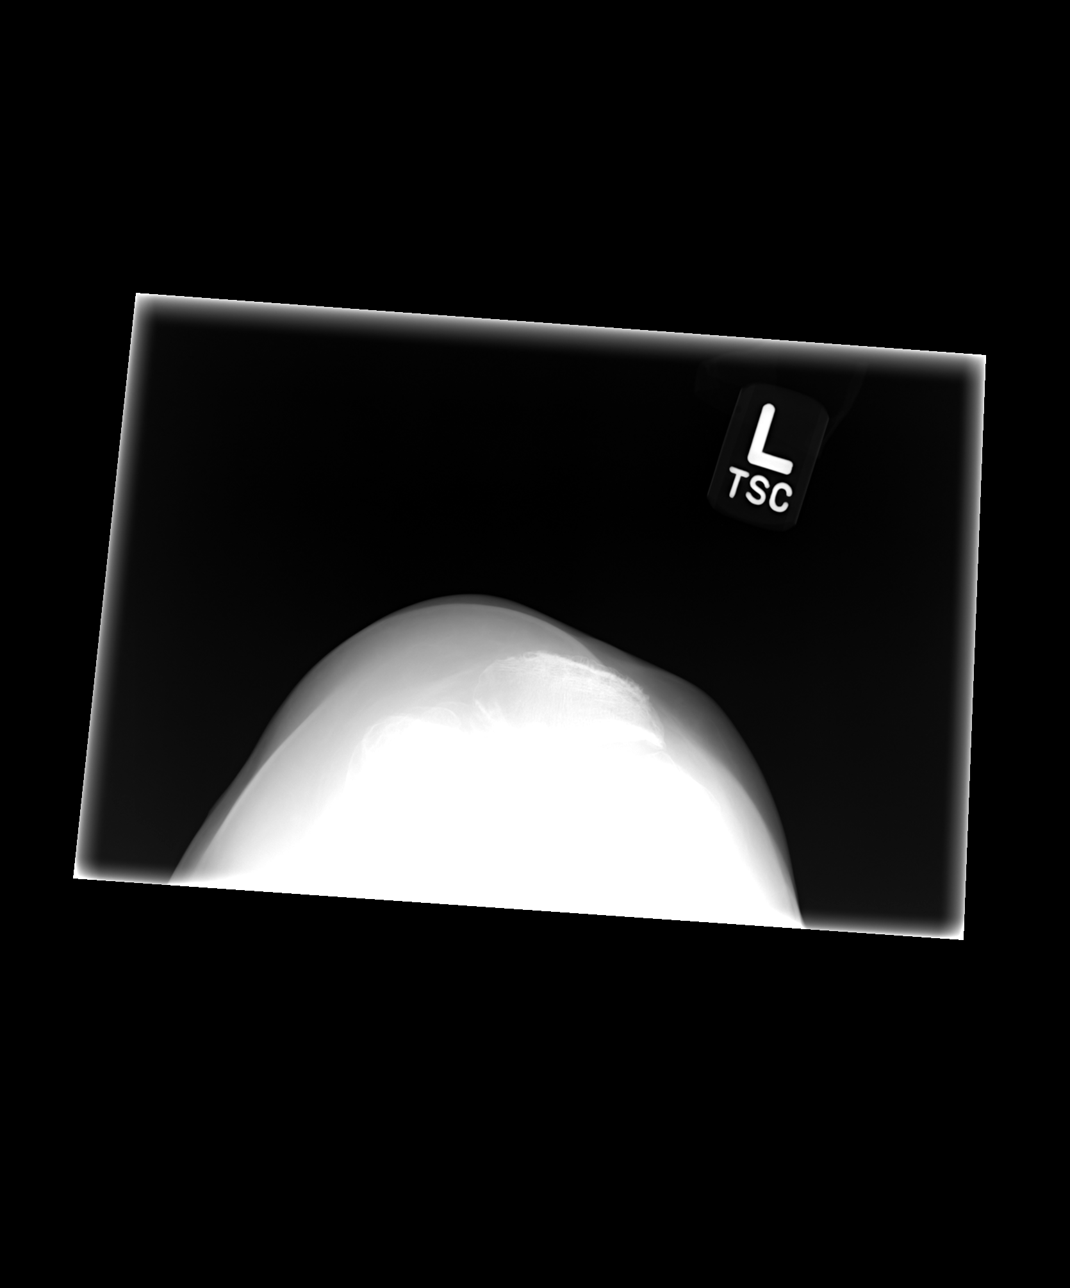

[3 of 3 positions shown; findings below may reference images not displayed]

FINDINGS: No fracture or dislocation. There is severe tricompartmental
degenerative change of the knee, worse within the medial compartment
with near complete joint space loss, subchondral sclerosis and
osteophytosis. There is minimal lateral deviation the tibial plateau
in relation to the distal femoral condyles. There is spurring of the
tibial spines. No evidence of chondrocalcinosis. No joint effusion.
Minimal enthesopathic change of the superior pole of the patella. No
radiopaque foreign body.
IMPRESSION: 1. No acute findings.
2. Severe tricompartmental degenerative change of the knee, worse
within the medial compartment.

## 2014-06-19 IMAGING — CR DG KNEE AP/LAT W/ SUNRISE*R*
3 series · 3 of 3 positions shown · non-contrast
Comparison: None.

CLINICAL DATA: Bilateral knee pain, no known trauma

EXAM:
DG KNEE - 3 VIEWS

[view not recorded (1 of 3)]
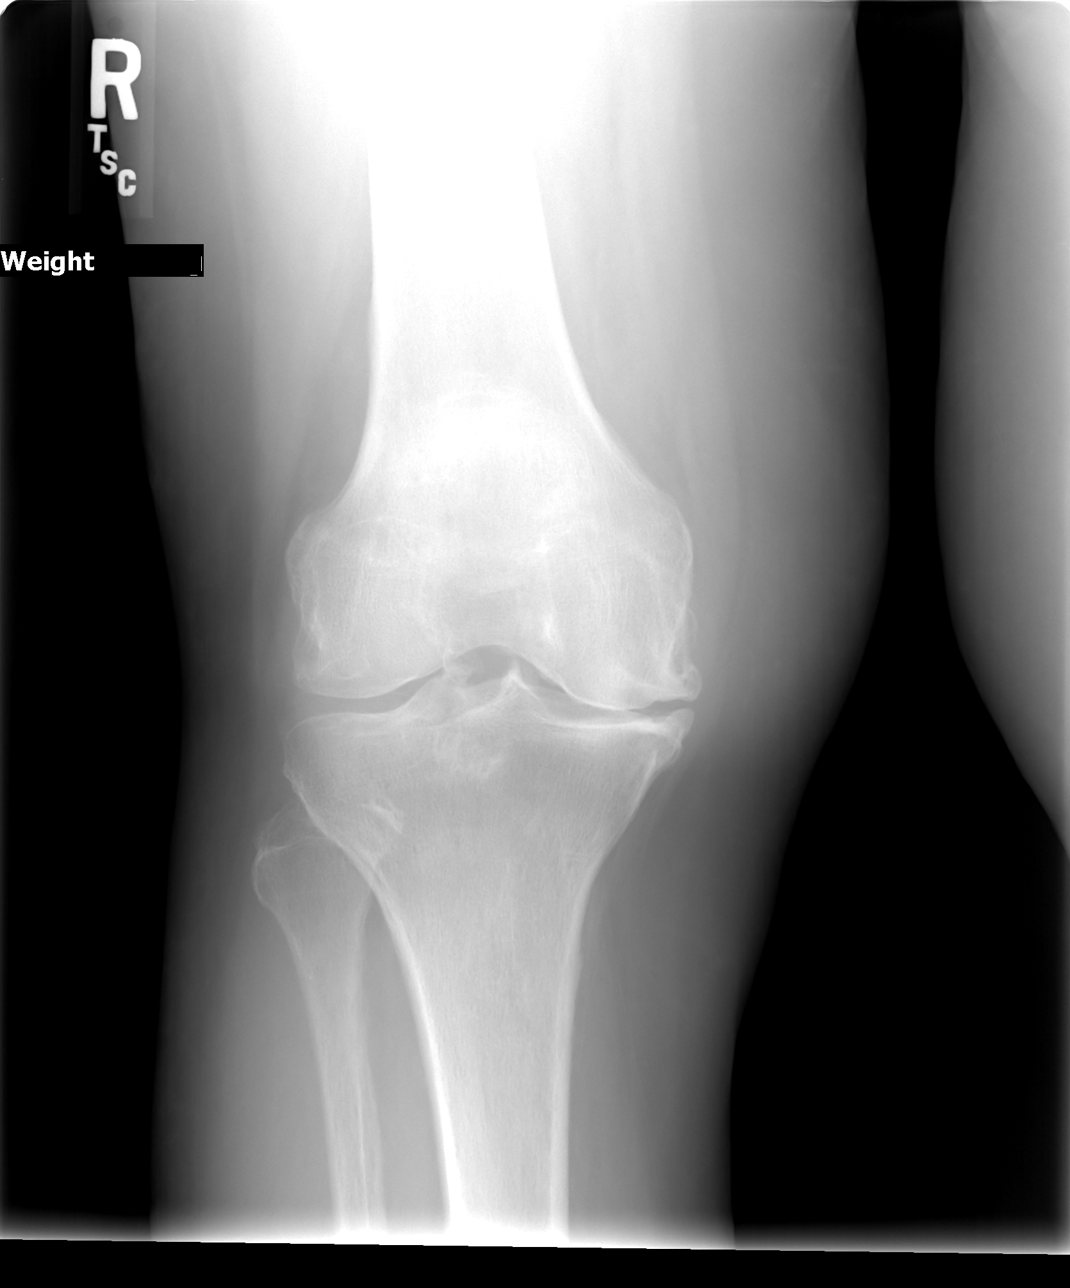

[view not recorded (2 of 3)]
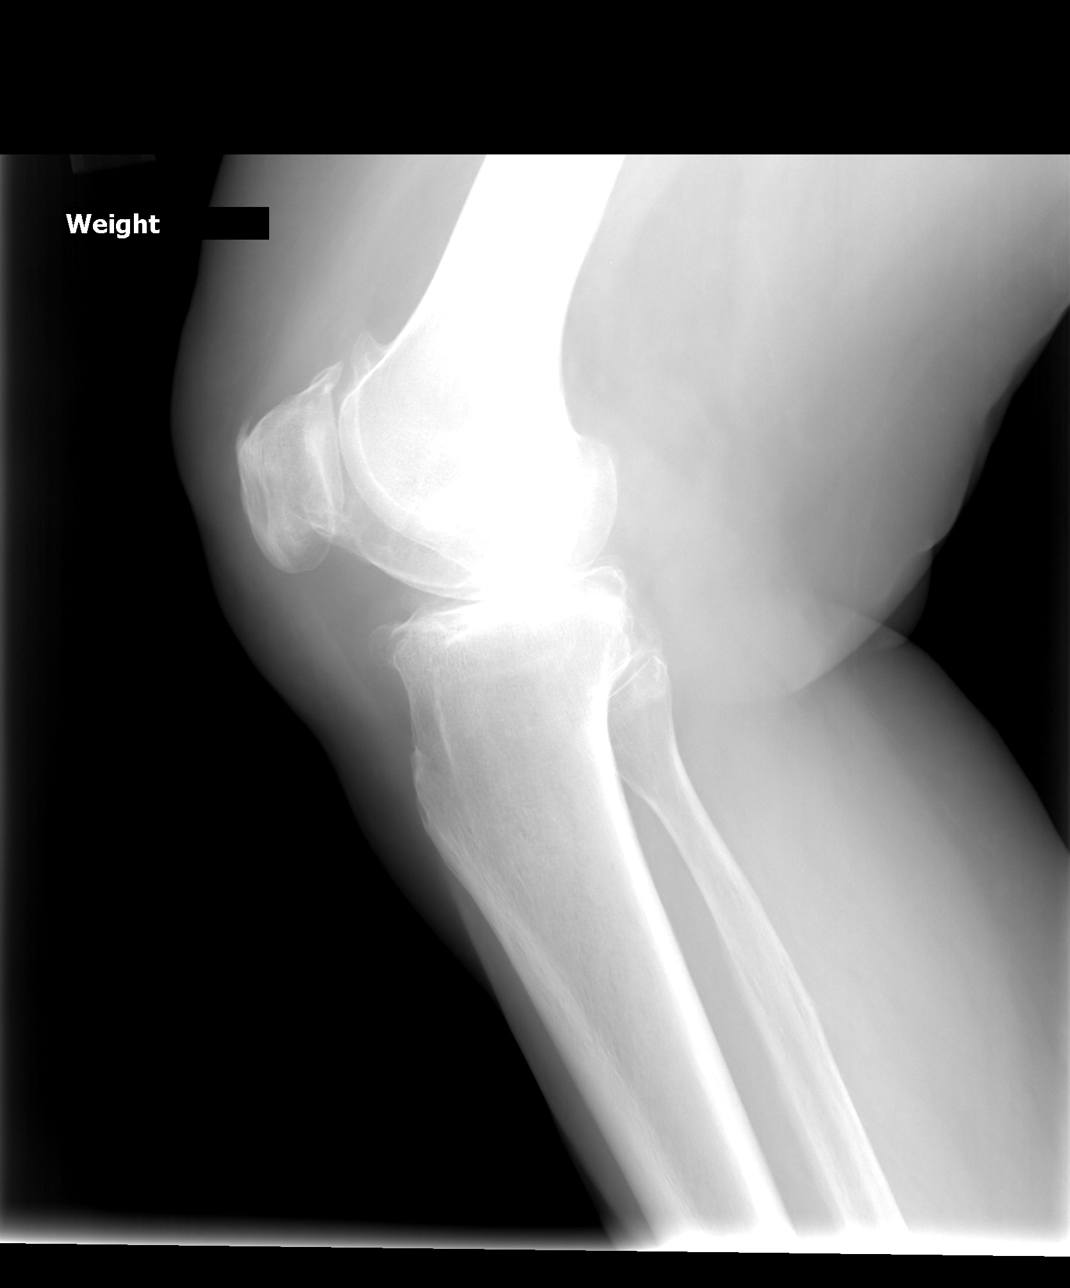

[view not recorded (3 of 3)]
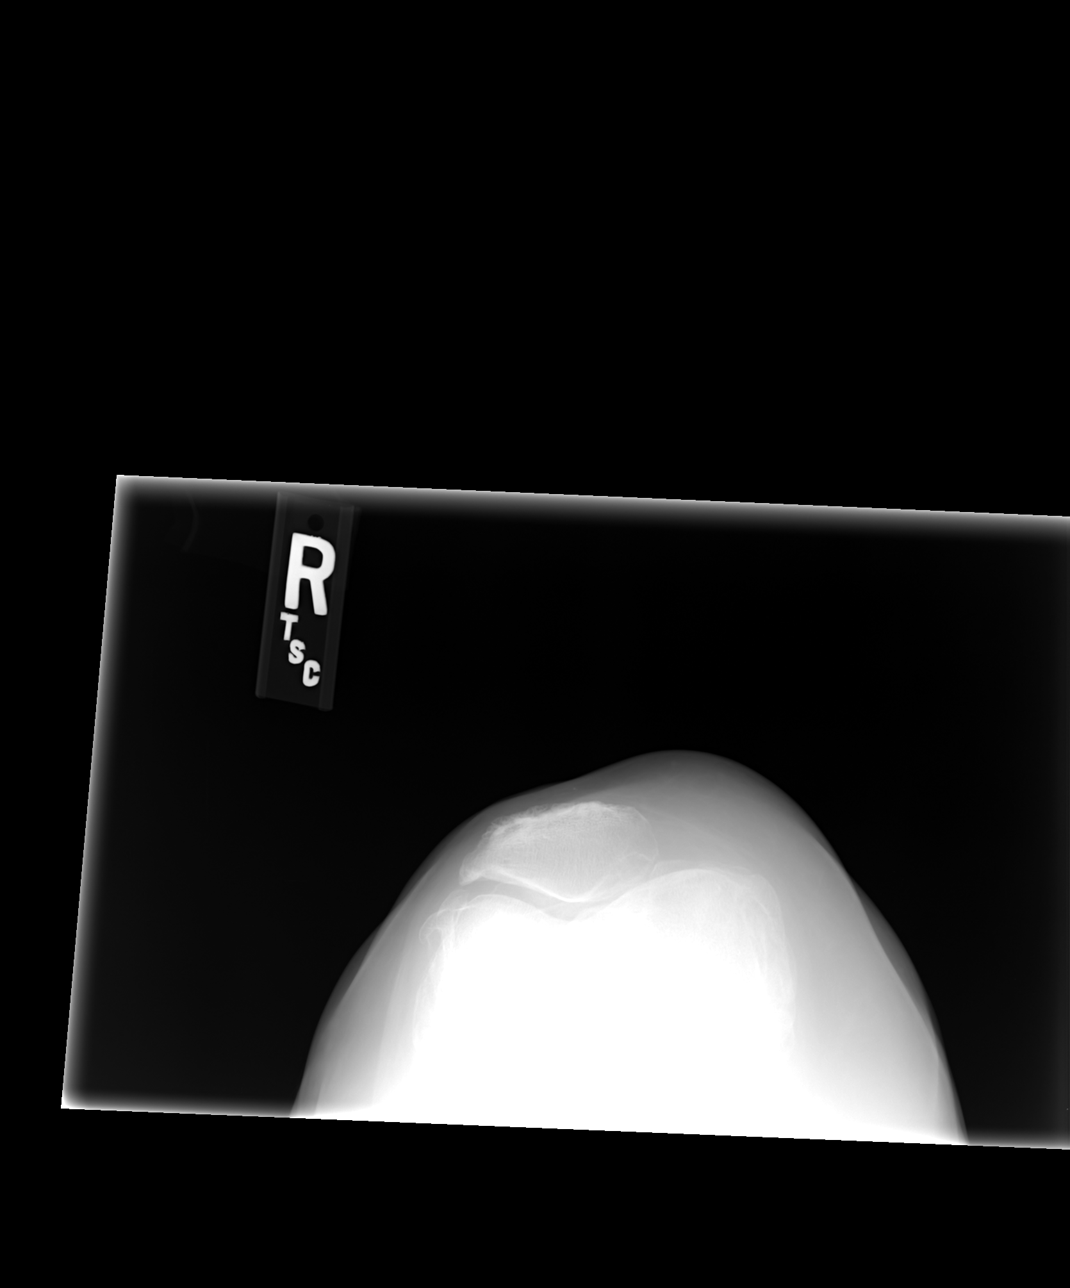

[3 of 3 positions shown; findings below may reference images not displayed]

FINDINGS: No fracture or dislocation. Severe tricompartmental degenerative
change of the knee, worse within the medial compartment with joint
space loss, articular surface irregularity, subchondral sclerosis
and osteophytosis. There is minimal lateral deviation the tibial
plateau in relation to the distal femoral condyles. There is
spurring of the tibial spines. No evidence of chondrocalcinosis.
Minimal enthesopathic change of the superior pole of the patella.
There is a potential small (approximately 0.5 cm) loose body within
the anterior aspect of the knee joint space without definitive donor
site. No joint effusion. No radiopaque foreign body.
IMPRESSION: 1. No acute findings.
2. Severe tricompartmental degenerative change of the knee, worse
within the medial compartment
3. Possible loose body within the anterior aspect of the knee joint
space without discrete donor site.

## 2014-07-15 ENCOUNTER — Ambulatory Visit: Payer: Self-pay | Admitting: Internal Medicine

## 2014-07-15 ENCOUNTER — Encounter: Payer: Self-pay | Admitting: *Deleted

## 2014-07-15 LAB — HM MAMMOGRAPHY: HM MAMMO: NEGATIVE

## 2014-08-15 ENCOUNTER — Other Ambulatory Visit: Payer: Self-pay | Admitting: Internal Medicine

## 2014-10-26 ENCOUNTER — Other Ambulatory Visit: Payer: Self-pay | Admitting: Internal Medicine

## 2015-01-06 ENCOUNTER — Ambulatory Visit: Payer: Commercial Managed Care - HMO | Admitting: Nurse Practitioner

## 2015-01-08 ENCOUNTER — Ambulatory Visit (INDEPENDENT_AMBULATORY_CARE_PROVIDER_SITE_OTHER): Payer: Commercial Managed Care - HMO | Admitting: Nurse Practitioner

## 2015-01-08 ENCOUNTER — Encounter: Payer: Self-pay | Admitting: Nurse Practitioner

## 2015-01-08 VITALS — BP 140/80 | HR 61 | Temp 97.6°F | Resp 12 | Ht 61.0 in | Wt 166.8 lb

## 2015-01-08 DIAGNOSIS — M25561 Pain in right knee: Secondary | ICD-10-CM

## 2015-01-08 DIAGNOSIS — M25562 Pain in left knee: Secondary | ICD-10-CM

## 2015-01-08 MED ORDER — TRAMADOL HCL 50 MG PO TABS: 50.0000 mg | ORAL_TABLET | Freq: Three times a day (TID) | ORAL | Status: DC | PRN

## 2015-01-08 NOTE — Patient Instructions (Signed)
Capzasin ointment to rub on your knees.   Wear a glove to apply OR wash hands VERY well because if you rub your eyes it could cause a burning sensation.  This is found over the counter.   Please [facility] Kar[facility]pencer [facility]land [facility] for injections.  [place] [facility] Address:  Address: [address], [place], [place] [address]  [phone]

## 2015-01-08 NOTE — Progress Notes (Signed)
   Subjective:    Patient ID: Tiffany Benjamin, female    DOB: 05/26/1933, 81 y.o.   MRN: [id number]  HPI  [name] is a [age] yo female with a CC of left knee pain.   1) She has been seen by Orville Governaquel Rey, Dr. Darrick Huntsmanullo, and Dr. Patsy Lageropland about her knee pain.  Left knee is worse than right knee.  Trying: Glucosamine, and chondroitin  Not tried Capzasin.  Tyelnol dulls it, tramadol- helpful   Still going to curves 3 x a week, nothing that particularly bothers her knees Morning and late in afternoon worse pain  Even laying down they hurt she reports so she puts them up on a pillow  Review of Systems  Constitutional: Negative for fever, chills, diaphoresis and fatigue.  Respiratory: Negative for chest tightness, shortness of breath and wheezing.   Cardiovascular: Negative for chest pain, palpitations and leg swelling.  Gastrointestinal: Negative for nausea, vomiting and diarrhea.  Musculoskeletal: Positive for arthralgias.       Knee pain bilaterally   Skin: Negative for rash.  Neurological: Negative for dizziness, weakness, numbness and headaches.  Psychiatric/Behavioral: The patient is not nervous/anxious.        Objective:   Physical Exam  Constitutional: She is oriented to person, place, and time. She appears well-developed and well-nourished. No distress.  BP 140/80 mmHg  Pulse 61  Temp(Src) 97.6 F (36.4 C) (Oral)  Resp 12  Ht 5\' 1"  (1.549 m)  Wt 166 lb 12.8 oz (75.66 kg)  BMI 31.53 kg/m2  SpO2 96%   HENT:  Head: Normocephalic and atraumatic.  Right Ear: External ear normal.  Left Ear: External ear normal.  Neck: Normal range of motion. Neck supple.  Cardiovascular: Normal rate, regular rhythm, normal heart sounds and intact distal pulses.  Exam reveals no gallop and no friction rub.   No murmur heard. Pulmonary/Chest: Effort normal and breath sounds normal. No respiratory distress. She has no wheezes. She has no rales. She exhibits no tenderness.  Musculoskeletal: Normal  range of motion. She exhibits edema and tenderness.  Bilateral knees.   Neurological: She is alert and oriented to person, place, and time. No cranial nerve deficit. She exhibits normal muscle tone. Coordination normal.  Skin: Skin is warm and dry. No rash noted. She is not diaphoretic.  Psychiatric: She has a normal mood and affect. Her behavior is normal. Judgment and thought content normal.      Assessment & Plan:

## 2015-01-08 NOTE — Progress Notes (Signed)
Pre visit review using our clinic review tool, if applicable. No additional management support is needed unless otherwise documented below in the visit note. 

## 2015-01-15 ENCOUNTER — Encounter: Payer: Self-pay | Admitting: Family Medicine

## 2015-01-15 ENCOUNTER — Ambulatory Visit (INDEPENDENT_AMBULATORY_CARE_PROVIDER_SITE_OTHER): Payer: Commercial Managed Care - HMO | Admitting: Family Medicine

## 2015-01-15 VITALS — BP 160/88 | HR 59 | Temp 98.9°F | Ht 61.0 in | Wt 173.5 lb

## 2015-01-15 DIAGNOSIS — M1710 Unilateral primary osteoarthritis, unspecified knee: Secondary | ICD-10-CM

## 2015-01-15 DIAGNOSIS — M170 Bilateral primary osteoarthritis of knee: Secondary | ICD-10-CM

## 2015-01-15 MED ORDER — METHYLPREDNISOLONE ACETATE 40 MG/ML IJ SUSP
80.0000 mg | Freq: Once | INTRAMUSCULAR | Status: AC
Start: 2015-01-15 — End: 2015-01-15
  Administered 2015-01-15: 80 mg via INTRA_ARTICULAR

## 2015-01-15 MED ORDER — METHYLPREDNISOLONE ACETATE 40 MG/ML IJ SUSP
80.0000 mg | Freq: Once | INTRAMUSCULAR | Status: AC
  Administered 2015-01-15: 80 mg via INTRA_ARTICULAR

## 2015-01-15 NOTE — Progress Notes (Signed)
Pre visit review using our clinic review tool, if applicable. No additional management support is needed unless otherwise documented below in the visit note. 

## 2015-01-15 NOTE — Progress Notes (Signed)
[address] [place] [address] Phone: [phone] Fax: [phone]  Patient ID: Tiffany PicaGracie Benjamin MRN: [id number], DOB: [birth year], 81 y.o. Date of Encounter: 01/15/2015  Primary Physician:  Sherlene ShamsULLO, TERESA L, MD   Chief Complaint: Knee Pain   Subjective:   History of Present Illness:  Tiffany Benjamin is a 81 y.o. very pleasant female patient who presents with the following:  Very pleasant patient who I remember well. She has advanced osteoarthritis bilaterally, she has had progressive symptoms for many years. She did have one injection in order days about 8 years ago. She is unable to take NSAIDs secondary to hypertension, and she does take Tylenol and has used a number of other things I have recommended another physician's recommended in the past. Currently she is having a bilateral knee flare.  03/04/2014 Last OV with Tiffany BeatSpencer Falisha Osment, MD  Patient presents with a many year h/o bilateral knee pain after no particular accident. No audible pop was heard. The patient has not had an effusion. No symptomatic giving-way. No mechanical clicking. Joint has not locked up. Patient has been able to walk but is limping sometimes. The patient does have pain going up and down stairs or rising from a seated position.   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  Review of Systems:  GEN: No fevers, chills. Nontoxic. Primarily MSK c/o today. MSK: Detailed in the HPI GI: tolerating PO intake without difficulty Neuro: No numbness, parasthesias, or tingling associated. Otherwise the pertinent positives of the ROS are noted above.    Objective:   Physical Examination: BP 160/88 mmHg  Pulse 59  Temp(Src) 98.9 F (37.2 C) (Oral)  Ht 5\' 1"  (1.549 m)  Wt 173 lb 8 oz (78.699 kg)  BMI 32.80 kg/m2   GEN: WDWN, NAD, Non-toxic, Alert & Oriented x 3 HEENT: Atraumatic, Normocephalic.  Ears and Nose: No external deformity. EXTR: No clubbing/cyanosis/edema PSYCH: Normally interactive. Conversant. Not depressed or anxious appearing.  Calm demeanor.   Knee:  B Gait: Normal heel toe pattern, mild antalgia ROM: +3 to 100 bilaterally Effusion: neg Echymosis or edema: none Patellar tendon NT Painful PLICA: neg Patellar grind: pos, minimal patellar motion Medial and lateral patellar facet loading: negative - essentially cannot be done medial and lateral joint lines:n TTP medially and laterally B Mcmurray's pain Flexion-pinch pos Bounce home is negative Varus and valgus stress: stable Lachman: neg Ant and Post drawer: neg Hip abduction, IR, ER: WNL Hip flexion str: 5/5 Hip abd: 5/5 Quad: 5/5 VMO atrophy:No Hamstring concentric and eccentric: 5/5   Radiology: Dg Knee Ap/lat W/sunrise Left  03/04/2014   CLINICAL DATA:  Bilateral knee pain, no known trauma  EXAM: DG KNEE - 3 VIEWS  COMPARISON:  None.  FINDINGS: No fracture or dislocation. There is severe tricompartmental degenerative change of the knee, worse within the medial compartment with near complete joint space loss, subchondral sclerosis and osteophytosis. There is minimal lateral deviation the tibial plateau in relation to the distal femoral condyles. There is spurring of the tibial spines. No evidence of chondrocalcinosis. No joint effusion. Minimal enthesopathic change of the superior pole of the patella. No radiopaque foreign body.  IMPRESSION: 1. No acute findings.  2. Severe tricompartmental degenerative change of the knee, worse within the medial compartment.   Electronically Signed   By: Simonne Come M.D.   On: 03/04/2014 09:46   Dg Knee Ap/lat W/sunrise  Right  03/04/2014   CLINICAL DATA:  Bilateral knee pain, no known trauma  EXAM: DG KNEE - 3 VIEWS  COMPARISON:  None.  FINDINGS: No fracture or dislocation. Severe tricompartmental degenerative change of the knee, worse within the medial compartment with joint space loss, articular surface irregularity, subchondral sclerosis and osteophytosis. There is minimal lateral deviation the tibial plateau in relation to the distal femoral condyles. There is spurring of the tibial spines. No evidence of chondrocalcinosis. Minimal enthesopathic change of the superior pole of the patella. There is a potential small (approximately 0.5 cm) loose body within the anterior aspect of the knee joint space without definitive donor site. No joint effusion. No radiopaque foreign body.  IMPRESSION: 1. No acute findings. 2. Severe tricompartmental degenerative change of the knee, worse within the medial compartment 3. Possibleloose body within the anterior aspect of the knee joint space without discrete donor site.   Electronically Signed   By: Simonne Come M.D.   On: 03/04/2014 09:48    Assessment & Plan:   Tricompartment Severe Knee OA, Bilateral  The patient really has bilateral end-stage tricompartmental arthritis. I do not think that there is anything surgically that could be done for the patient short of a total knee arthroplasty. For now, she was to continue conservative care, and I think that that is reasonable with intermittent corticosteroid injections and / or hyaluronic acid injections. Exercise and weight loss as able.  Knee Injection, RIGHT Patient verbally consented to procedure. Risks (including potential rare risk of infection), benefits, and alternatives explained. Sterilely prepped with Chloraprep. Ethyl cholride used for anesthesia. 8 cc Lidocaine 1% mixed with Depo-Medrol 80 mg injected using the anteromedial approach without difficulty. No complications with procedure and tolerated well. Patient had decreased  pain post-injection.   Knee Injection, LEFT Patient verbally consented to procedure. Risks (including potential rare risk of infection), benefits, and alternatives explained. Sterilely prepped with Chloraprep. Ethyl cholride used for anesthesia. 8 cc Lidocaine 1% mixed with Depo-Medrol 80 mg injected using the anteromedial approach without difficulty. No complications with procedure and tolerated well. Patient had decreased pain post-injection.   F/u prn  Signed,  Ryleah Miramontes T. Julianne Chamberlin, MD, [facility]   Patient's Medications  New Prescriptions   No medications on file  Previous Medications   AMLODIPINE (NORVASC) 5 MG TABLET    TAKE 1 TABLET BY MOUTH DAILY.   ASPIRIN 81 MG TABLET    Take 81 mg by mouth daily.   METOPROLOL SUCCINATE (TOPROL-XL) 50 MG 24 HR TABLET    TAKE 1 TABLET BY MOUTH DAILY. TAKE WITH OR IMMEDIATELY FOLLOWING A MEAL.   SIMVASTATIN (ZOCOR) 20 MG TABLET    TAKE 1 TABLET (20 MG TOTAL) BY MOUTH EVERY EVENING.   TRAMADOL (ULTRAM) 50 MG TABLET    Take 1 tablet (50 mg total) by mouth every 8 (eight) hours as needed.  Modified Medications   No medications on file  Discontinued Medications   No medications on file

## 2015-01-15 NOTE — Addendum Note (Signed)
Addended by: Damita LackLORING, Branden Shallenberger S on: 01/15/2015 10:33 AM   Modules accepted: Orders

## 2015-01-18 NOTE — Assessment & Plan Note (Signed)
Pt was asked to try capzasin ointment and continue tramadol until able to be seen by Dr. Patsy Lageropland for injections and further work up. Will follow.

## 2015-01-26 NOTE — Consult Note (Signed)
General Aspect 81 year old [place] American female with a history of hypertension, hyperlipidemia who presented to the ED with fever of 104 and weakness. cardiology was consulted for elevated cardiac enz.   the patient reports having malaise and fever five days ago to 104 deg and went to Urgent Care and was diagnosed with urinary tract infection and treated with Keflex. she continued to feel poorly through the week and was sent to the ER by her PMD. She continued to have  fever again to 104 deg.    The patient denies any chest pain, cough, or phlegm, but has shortness of breath and mild cough. The patient denies any chest pain, palpitations, orthopnea, or nocturnal dyspnea. No leg edema. No dysuria or hematuria. No abdominal pain, nausea, vomiting, or diarrhea.  Levaquin was started in the ER. Chest x-ray showed pneumonia in the left upper lobe.  Troponin is high normal range.  PAST MEDICAL HISTORY:  1. Hypertension. 2. Possible hyperlipidemia.   SOCIAL HISTORY: No smoking or drinking or illicit drugs.   PAST SURGICAL HISTORY:  None.  FAMILY HISTORY: Diabetes. One sister had cancer.   ALLERGIES: No known drug allergies.   Physical Exam:   GEN well developed, well nourished, no acute distress    HEENT red conjunctivae    NECK supple    RESP normal resp effort  rhonchi    CARD Regular rate and rhythm  No murmur    ABD denies tenderness  soft    LYMPH negative neck    EXTR negative edema    SKIN normal to palpation    NEURO cranial nerves intact, motor/sensory function intact    PSYCH alert, A+O to time, place, person, good insight   Review of Systems:   Subjective/Chief Complaint Cough, malaise, chills fever    General: Weakness    Skin: No Complaints    ENT: No Complaints    Eyes: No Complaints    Neck: No Complaints    Respiratory: Frequent cough  Short of breath    Cardiovascular: No Complaints    Gastrointestinal: No Complaints    Genitourinary: No  Complaints    Musculoskeletal: No Complaints    Neurologic: No Complaints    Hematologic: No Complaints    Endocrine: No Complaints    Psychiatric: No Complaints    Review of Systems: All other systems were reviewed and found to be negative    Medications/Allergies Reviewed Medications/Allergies reviewed     Hypertension:        Admit Diagnosis:   PNEUMONIA: 21-Mar-2012, Active, PNEUMONIA      Admit Reason:   PNEUMONIA, ORGANISM NOS: (486) 21-Mar-2012, Active, ICD9, PNEUMONIA, ORGANISM NOS, Auto-generated by MLM Based on Admission Order  Home Medications: Medication Instructions Status  Tylenol 325 mg oral tablet 2 tab(s) orally , As Needed- for Fever  Active  Vitamin C 500 mg oral tablet 1 tab(s) orally once a day Active  Vitamin D3 1000 intl units oral tablet 1 tab(s) orally once a day Active  multivitamin 1 tab(s) orally once a day Active  amlodipine 5 mg oral tablet 1 tab(s) orally once a day Active  simvastatin 20 mg oral tablet 1 tab(s) orally once a day (at bedtime) Active  atenolol 25 mg oral tablet 1 tab(s) orally once a day Active   Lab Results:  Thyroid:  18-Jun-13 09:36    Thyroid Stimulating Hormone  0.418 (0.45-4.50 (International Unit)  ----------------------- Pregnant patients have  different reference  ranges for TSH:  - - - - - - - - - -  Pregnant, first trimetser:  0.36 - 2.50 uIU/mL)  Hepatic:  18-Jun-13 09:36    Bilirubin, Total 0.7   Alkaline Phosphatase 116   SGPT (ALT)  116 (12-78 NOTE: NEW REFERENCE RANGE 08/27/2011)   SGOT (AST)  122   Total Protein, Serum 7.4   Albumin, Serum  2.5  Routine Chem:  18-Jun-13 09:36    Result Comment troponin - RESULTS VERIFIED BY REPEAT TESTING.  - RESULT CALLED TO AND READ BACK FROM  - JEAN JORDAN/1050/03-21-12/SFW.  Result(s) reported on 21 Mar 2012 at 10:50AM.   Glucose, Serum  118   BUN 17   Creatinine (comp)  1.51   Sodium, Serum  132   Potassium, Serum  3.3   Chloride, Serum  97   CO2,  Serum 26   Calcium (Total), Serum  8.0   Anion Gap 9   Osmolality (calc) 267   eGFR (African American)  38   eGFR (Non-African American)  33 (eGFR values <44m/min/1.73 m2 may be an indication of chronic kidney disease (CKD). Calculated eGFR is useful in patients with stable renal function. The eGFR calculation will not be reliable in acutely ill patients when serum creatinine is changing rapidly. It is not useful in  patients on dialysis. The eGFR calculation may not be applicable to patients at the low and high extremes of body sizes, pregnant women, and vegetarians.)    17:26    Result Comment troponin - RESULTS VERIFIED BY REPEAT TESTING.  - prev. called '@10' .50 03/21/12 aam  Result(s) reported on 21 Mar 2012 at 06:36PM.  19-Jun-13 03:16    Result Comment TROPONIN - RESULTS VERIFIED BY REPEAT TESTING.  - PREVIOUS CALL:03/21/12'@1050' ...TPL  Result(s) reported on 22 Mar 2012 at 04:24AM.   Cholesterol, Serum 89   Triglycerides, Serum 141   HDL (INHOUSE)  11   VLDL Cholesterol Calculated 28   LDL Cholesterol Calculated 50 (Result(s) reported on 22 Mar 2012 at 04:00AM.)   Glucose, Serum  103   BUN 11   Creatinine (comp) 1.11   Sodium, Serum 137   Potassium, Serum 4.0   Chloride, Serum 103   CO2, Serum 28   Calcium (Total), Serum  7.9   Anion Gap  6   Osmolality (calc) 273   eGFR (African American)  55   eGFR (Non-African American)  48 (eGFR values <631mmin/1.73 m2 may be an indication of chronic kidney disease (CKD). Calculated eGFR is useful in patients with stable renal function. The eGFR calculation will not be reliable in acutely ill patients when serum creatinine is changing rapidly. It is not useful in  patients on dialysis. The eGFR calculation may not be applicable to patients at the low and high extremes of body sizes, pregnant women, and vegetarians.)   Magnesium, Serum 2.1 (1.8-2.4 THERAPEUTIC RANGE: 4-7 mg/dL TOXIC: > 10 mg/dL  -----------------------)   Cardiac:  18-Jun-13 09:36    Troponin I  0.12 (0.00-0.05 0.05 ng/mL or less: NEGATIVE  Repeat testing in 3-6 hrs  if clinically indicated. >0.05 ng/mL: POTENTIAL  MYOCARDIAL INJURY. Repeat  testing in 3-6 hrs if  clinically indicated. NOTE: An increase or decrease  of 30% or more on serial  testing suggests a  clinically important change)   CK, Total  397   CPK-MB, Serum 1.4 (Result(s) reported on 21 Mar 2012 at 10:47AM.)    17:26    Troponin I  0.10 (0.00-0.05 0.05 ng/mL or less: NEGATIVE  Repeat testing in 3-6 hrs  if clinically indicated. >0.05 ng/mL: POTENTIAL  MYOCARDIAL INJURY. Repeat  testing in 3-6 hrs if  clinically indicated. NOTE: An increase or decrease  of 30% or more on serial  testing suggests a  clinically important change)  19-Jun-13 03:16    Troponin I  0.09 (0.00-0.05 0.05 ng/mL or less: NEGATIVE  Repeat testing in 3-6 hrs  if clinically indicated. >0.05 ng/mL: POTENTIAL  MYOCARDIAL INJURY. Repeat  testing in 3-6 hrs if  clinically indicated. NOTE: An increase or decrease  of 30% or more on serial  testing suggests a  clinically important change)  Routine Hem:  18-Jun-13 09:36    WBC (CBC) 8.6   RBC (CBC)  3.64   Hemoglobin (CBC)  11.8   Hematocrit (CBC)  34.6   Platelet Count (CBC) 189 (Result(s) reported on 21 Mar 2012 at 10:21AM.)   MCV 95   MCH 32.5   MCHC 34.1   RDW  14.7  19-Jun-13 03:16    WBC (CBC) 8.6   RBC (CBC)  3.41   Hemoglobin (CBC)  11.1   Hematocrit (CBC)  32.2   Platelet Count (CBC) 202   MCV 95   MCH 32.5   MCHC 34.4   RDW 14.5   Neutrophil % 77.7   Lymphocyte % 14.2   Monocyte % 7.6   Eosinophil % 0.4   Basophil % 0.1   Neutrophil #  6.7   Lymphocyte # 1.2   Monocyte # 0.7   Eosinophil # 0.0   Basophil # 0.0 (Result(s) reported on 22 Mar 2012 at 03:58AM.)   EKG:   Interpretation EKG shows NSR with rtae 68 bpm, no significant ST or T wave changes    No Known Allergies:   Vital Signs/Nurse's  Notes: **Vital Signs.:   19-Jun-13 05:00   Vital Signs Type Routine   Temperature Temperature (F) 102.2   Celsius 39   Temperature Source Oral   Pulse Pulse 82   Pulse source per vital sign device   Respirations Respirations 20   Systolic BP Systolic BP 919   Diastolic BP (mmHg) Diastolic BP (mmHg) 76   Mean BP 107   BP Source vital sign device   Pulse Ox % Pulse Ox % 94   Pulse Ox Activity Level  At rest   Oxygen Delivery Room Air/ 21 %     Impression 81 year old [place] American female with a history of hypertension, hyperlipidemia who presented to the ED with fever of 104 and weakness. cardiology was consulted for elevated cardiac enz.  1) elevated cardiac enz minimal elevation, normal echo normal EKG, no prior hx, few risk factors No further workup at this time --Continue asa, b-blocker, consider statin  2) PNA still with fevers on ABX  3) HTN: Will restart and increase amlodipine to 10 mg daily consider increasing metoprolol to 50 mg if BP remains elevated could add clonidine 0.1 mg BID or hydralazine 25 mg TID or long acting nitrates for BP if needed   Electronic Signatures: Ida Rogue (MD)  (Signed 19-Jun-13 22:04)  Authored: General Aspect/Present Illness, History and Physical Exam, Review of System, Past Medical History, Health Issues, Home Medications, Labs, EKG , Allergies, Vital Signs/Nurse's Notes, Impression/Plan   Last Updated: 19-Jun-13 22:04 by Ida Rogue (MD)

## 2015-01-26 NOTE — H&P (Signed)
PATIENT NAME:  Tiffany Benjamin, Tiffany Benjamin MR#:  [id number] DATE OF BIRTH:  1933/06/13  DATE OF ADMISSION:  03/21/2012  PRIMARY CARE PHYSICIAN:  Dr. Terance Hart  REFERRING PHYSICIAN:  Dr. Carollee Massed   CHIEF COMPLAINT: Fever five days ago and today.   HISTORY OF PRESENT ILLNESS: 81 year old African American female with a history of hypertension and possible hyperlipidemia who presented to the ED with fever of 104 today and weakness. The patient is alert, awake, oriented, in no acute distress. The patient's daughter said the patient had a fever five days ago about 104 and went to Urgent Care and was diagnosed with urinary tract infection and treated with Keflex. However, the patient still feels weak and  developed fever again to 104 today. The patient went to her primary care physician's office and was sent to the ED for further evaluation. The patient denies any chest pain, cough, or phlegm, but has shortness of breath sometimes. The patient denies any chest pain, palpitations, orthopnea, or nocturnal dyspnea. No leg edema. No dysuria or hematuria. No abdominal pain, nausea, vomiting, or diarrhea. The patient was treated with Levaquin. Chest x-ray showed pneumonia.   PAST MEDICAL HISTORY:  1. Hypertension. 2. Possible hyperlipidemia.   SOCIAL HISTORY: No smoking or drinking or illicit drugs.   PAST SURGICAL HISTORY:  None.  FAMILY HISTORY: Diabetes. One sister had cancer.   ALLERGIES: No known drug allergies.   MEDICATIONS:  1. Aspirin 81 mg p.o. daily. 2. Norvasc 5 mg p.o. daily.  3. Crestor. 4. Tylenol 650 mg p.o. p.r.n.  5. Multivitamin 1 tablet p.o. daily.  6. Vitamin D3 1000 units p.o. daily.  7. Vitamin C 500 mg p.o. daily.   REVIEW OF SYSTEMS: CONSTITUTIONAL: The patient has a high fever and weakness but no headache, dizziness, or weight loss. EYES: No double vision or blurred vision. ENT: No postnasal drip, epistaxis, slurred speech, or dysphagia. CARDIOVASCULAR: No chest pain, palpitation, or  orthopnea. No nocturnal dyspnea. No leg edema. PULMONARY: No cough or sputum, but has shortness of breath sometimes. No hemoptysis.  No wheezing. GI: No abdominal pain, nausea, vomiting, or diarrhea. No melena or bloody stool. GU: No dysuria, hematuria, or incontinence. SKIN: No rash or jaundice. HEME: No easy bruising or bleeding. ENDOCRINE: No polyuria, polydipsia, or heat or cold intolerance.  MUSCULOSKELETAL: No joint pain.  NEURO: No syncope, loss of consciousness, or seizure.   PHYSICAL EXAMINATION:  VITALS: Temperature 98.2, blood pressure 131/59, pulse 74, respirations 18, oxygen saturation 100% on room air.    GENERAL: The patient is alert, awake, oriented, in no acute distress.   HEENT: Pupils are round, equal, and reactive to light and accommodation. Moist oral mucosa. Clear oropharynx.   NECK: Supple. No JVD or carotid bruits. No lymphadenopathy. No thyromegaly.   CARDIOVASCULAR: S1, S2 regular rate and rhythm. No murmurs or gallops.   PULMONARY: Bilateral air entry. No wheezing or rales.  No use of accessory muscles to breathe.   ABDOMEN: Soft. No distention. No tenderness. No organomegaly. Bowel sounds present.   EXTREMITIES: No edema, clubbing, or cyanosis. No calf tenderness. Strong bilateral pedal pulses.   SKIN: No rash or jaundice.   NEUROLOGIC: Alert and oriented times three. No focal deficit. Power 5/5. Sensation intact. Deep tendon reflexes 2+.   LABORATORY DATA: WBC 8.6, hemoglobin 11.8, platelets 189, glucose 118, BUN 17, creatinine 1.51, sodium 132, potassium 3.3, chloride 97, bicarbonate 26, SGOT 122,  SGPT 116, albumin 2.5, CK 397, CK-MB 1.4, troponin 0.12.   Chest x-ray showed left  lower lobe infiltrate.   EKG showed normal sinus rhythm at 67 beats per minute with moderate voltage criteria for left ventricular hypertrophy.   IMPRESSION:  1. Left lower lobe pneumonia CAP.  2. Elevated troponin.  3. Acute renal failure.  4. Hypokalemia.  5. Hyponatremia.   6. Abnormal liver function tests. 7. Hypertension, controlled.  8. Anemia.   PLAN OF TREATMENT:  1. The patient will be admitted to the medical floor. We will start Zithromax and Rocephin for pneumonia.  2. For elevated troponin we will follow up troponin level. Start aspirin 325 mg p.o. daily and start Lipitor 40 mg p.o. at bedtime. Discontinued Norvasc, gave her Lopressor. We will get a cardiology consult and check lipid panel.  3. For acute renal failure and hyponatremia, we will start  normal saline 100 mL/h and follow up BMP.  4. For hypokalemia, we will give potassium supplement and follow up BMP and magnesium level.  5. For abnormal liver function test and renal insufficiency we will get an abdominal ultrasound.  6. GI and deep vein thrombosis prophylaxis.   Discussed the patient's situation and the plan of treatment with the patient and the patient's daughter.      TIME SPENT: About 65 minutes.   ____________________________ Shaune PollackQing Devere Brem, MD qc:bjt D: 03/21/2012 12:36:23 ET T: 03/21/2012 13:08:06 ET JOB#: [id number]  cc: Shaune PollackQing Baptiste Littler, MD, <Dictator> Teena Iraniavid M. Terance HartBronstein, MD Shaune PollackQING Shakeia Krus MD ELECTRONICALLY SIGNED 03/22/2012 19:22

## 2015-01-26 NOTE — Discharge Summary (Signed)
PATIENT NAME:  Tiffany Benjamin, Tiffany Benjamin MR#:  [id number] DATE OF BIRTH:  1933-03-03  DATE OF ADMISSION:  03/21/2012 DATE OF DISCHARGE:  03/24/2012  ADMITTING PHYSICIAN: Dr. Imogene Burnhen DISCHARGING PHYSICIAN: Dr. Enid Baasadhika Abas Leicht  PRIMARY CARE PHYSICIAN: Dr. Dorothey Basemanavid Bronstein   CONSULTATION IN THE HOSPITAL: Cardiology consultation by Dr. Julien Nordmannimothy Gollan.   DISCHARGE DIAGNOSES:  1. Pneumonia.  2. Hypertension.  3. Hyperlipidemia.  4. Hypokalemia, improved.  5. Acute renal failure, resolving.  6. Chronic anemia, improved.  7. Elevated troponin, likely demand ischemia from pneumonia.   DISCHARGE HOME MEDICATIONS:  1. Vitamin C 500 mg p.o. daily.  2. Vitamin D3 1000 international units p.o. daily. 3. Multivitamin 1 tablet p.o. daily.  4. Simvastatin 20 mg p.o. daily.  5. Norvasc 10 mg p.o. daily.  6. Metoprolol 50 mg p.o. b.i.d.  7. Levaquin 500 mg p.o. daily until 03/31/2012.    DISCHARGE DIET: Low sodium diet.   DISCHARGE ACTIVITY: As tolerated.   FOLLOW-UP INSTRUCTIONS: PCP follow up in 1 to 2 weeks.   LABORATORY, DIAGNOSTIC AND RADIOLOGICAL DATA:  WBC 8.6, hemoglobin 11.1, hematocrit 32.2, platelet count 202.   Sodium 137, potassium 4.0, chloride 103, bicarbonate 28, BUN 11, creatinine 1.11, glucose 103, calcium 7.9. LDL 50, HDL 11, total cholesterol 89, triglycerides 141, magnesium 2.1. Troponin was elevated at 0.10. Blood cultures remained negative. On admission chest x-ray showing consolidation in anterior segment of left upper lobe consistent with pneumonia. No pleural effusion or pneumothorax and heart rate is normal. No evidence of any urinary tract infection seen.   Urine cultures negative. Abdominal ultrasound showing no gallstones or acute changes. FT4 was within normal limits at 1.36. This was done because TSH was low at 0.4.   Repeat chest x-ray prior to discharge showing persistent infiltrate left upper lobe, however, appears improved when compared to admission x-ray.   BRIEF  HOSPITAL COURSE: [name] is a 81 year old female with past medical history of hypertension, hyperlipidemia who was brought into the hospital with fever, weakness and chills. She did not have any cough, chest pain or difficulty breathing on admission, however, chest x-ray showed left upper lobe pneumonia.  1. Left upper lobe likely community-acquired pneumonia. Started on Rocephin and azithromycin while in the hospital. Her sats were within normal limits. Her chief complaint was persistent weakness which has improved prior to discharge. Her antibiotics were changed to Levaquin and she has ambulated without any difficulty. Repeat chest x-ray shows improvement in the infiltrate seen on chest x-ray.  2. Hypertension. Blood pressure was elevated while in the hospital. She is being discharged on increased dose of Norvasc and also metoprolol.  3. Acute renal failure and hypokalemia. Likely secondary to dehydration on admission though her potassium was replaced and with IV fluids renal function has normalized.  4. Her course has been otherwise uneventful in the hospital. Patient's other home medications were continued.   DISCHARGE CONDITION: Stable.   DISCHARGE DISPOSITION: Home.        TIME SPENT ON DISCHARGE: 40 minutes.   ____________________________ Enid Baasadhika [name], MD rk:cms D: 03/25/2012 11:37:27 ET T: 03/25/2012 13:06:43 ET JOB#: [id number]  cc: Enid Baasadhika Worthington Cruzan, MD, <Dictator> Teena Iraniavid M. Terance HartBronstein, MD Enid BaasADHIKA Latrell Potempa MD ELECTRONICALLY SIGNED 03/29/2012 15:25

## 2015-02-06 ENCOUNTER — Other Ambulatory Visit: Payer: Self-pay | Admitting: Internal Medicine

## 2015-02-19 ENCOUNTER — Encounter: Payer: Self-pay | Admitting: Internal Medicine

## 2015-02-19 ENCOUNTER — Ambulatory Visit (INDEPENDENT_AMBULATORY_CARE_PROVIDER_SITE_OTHER): Payer: Commercial Managed Care - HMO | Admitting: Internal Medicine

## 2015-02-19 VITALS — BP 166/100 | HR 59 | Temp 97.8°F | Resp 14 | Ht 62.5 in | Wt 169.0 lb

## 2015-02-19 DIAGNOSIS — E559 Vitamin D deficiency, unspecified: Secondary | ICD-10-CM

## 2015-02-19 DIAGNOSIS — R5383 Other fatigue: Secondary | ICD-10-CM | POA: Diagnosis not present

## 2015-02-19 DIAGNOSIS — Z0000 Encounter for general adult medical examination without abnormal findings: Secondary | ICD-10-CM

## 2015-02-19 DIAGNOSIS — Z23 Encounter for immunization: Secondary | ICD-10-CM | POA: Diagnosis not present

## 2015-02-19 DIAGNOSIS — Z1159 Encounter for screening for other viral diseases: Secondary | ICD-10-CM

## 2015-02-19 DIAGNOSIS — E785 Hyperlipidemia, unspecified: Secondary | ICD-10-CM

## 2015-02-19 DIAGNOSIS — E669 Obesity, unspecified: Secondary | ICD-10-CM

## 2015-02-19 LAB — COMPREHENSIVE METABOLIC PANEL
ALBUMIN: 4.3 g/dL (ref 3.5–5.2)
ALT: 17 U/L (ref 0–35)
AST: 17 U/L (ref 0–37)
Alkaline Phosphatase: 69 U/L (ref 39–117)
BUN: 17 mg/dL (ref 6–23)
CO2: 31 mEq/L (ref 19–32)
Calcium: 9.4 mg/dL (ref 8.4–10.5)
Chloride: 103 mEq/L (ref 96–112)
Creatinine, Ser: 0.98 mg/dL (ref 0.40–1.20)
GFR: 70.19 mL/min (ref >60.00)
GLUCOSE: 82 mg/dL (ref 70–99)
POTASSIUM: 4.4 meq/L (ref 3.5–5.1)
Sodium: 141 mEq/L (ref 135–145)
TOTAL PROTEIN: 7.4 g/dL (ref 6.0–8.3)
Total Bilirubin: 0.7 mg/dL (ref 0.2–1.2)

## 2015-02-19 LAB — LIPID PANEL
CHOLESTEROL: 210 mg/dL — AB (ref 0–200)
HDL: 105.9 mg/dL (ref >39.00)
LDL Cholesterol: 88 mg/dL (ref 0–99)
NonHDL: 104.1
Total CHOL/HDL Ratio: 2
Triglycerides: 82 mg/dL (ref 0.0–149.0)
VLDL: 16.4 mg/dL (ref 0.0–40.0)

## 2015-02-19 LAB — VITAMIN D 25 HYDROXY (VIT D DEFICIENCY, FRACTURES): VITD: 56.52 ng/mL (ref 30.00–100.00)

## 2015-02-19 LAB — CBC WITH DIFFERENTIAL/PLATELET
BASOS ABS: 0 10*3/uL (ref 0.0–0.1)
BASOS PCT: 0.4 % (ref 0.0–3.0)
EOS ABS: 0.1 10*3/uL (ref 0.0–0.7)
Eosinophils Relative: 0.9 % (ref 0.0–5.0)
HCT: 40.9 % (ref 36.0–46.0)
Hemoglobin: 13.9 g/dL (ref 12.0–15.0)
LYMPHS PCT: 30.6 % (ref 12.0–46.0)
Lymphs Abs: 1.9 10*3/uL (ref 0.7–4.0)
MCHC: 34 g/dL (ref 30.0–36.0)
MCV: 96.8 fl (ref 78.0–100.0)
Monocytes Absolute: 0.5 10*3/uL (ref 0.1–1.0)
Monocytes Relative: 8 % (ref 3.0–12.0)
Neutro Abs: 3.7 10*3/uL (ref 1.4–7.7)
Neutrophils Relative %: 60.1 % (ref 43.0–77.0)
PLATELETS: 257 10*3/uL (ref 150.0–400.0)
RBC: 4.23 Mil/uL (ref 3.87–5.11)
RDW: 15.4 % (ref 11.5–15.5)
WBC: 6.2 10*3/uL (ref 4.0–10.5)

## 2015-02-19 LAB — TSH: TSH: 0.74 u[IU]/mL (ref 0.35–4.50)

## 2015-02-19 NOTE — Patient Instructions (Signed)

## 2015-02-19 NOTE — Progress Notes (Signed)
Pre-visit discussion using our clinic review tool. No additional management support is needed unless otherwise documented below in the visit note.  

## 2015-02-19 NOTE — Progress Notes (Signed)
.   Patient ID: Tiffany Benjamin, female   DOB: Jul 28, 1933, 81 y.o.   MRN: [id number] The patient is here for annual Medicare wellness examination and management of other chronic and acute problems.   The risk factors are reflected in the social history.  The roster of all physicians providing medical care to patient - is listed in the Snapshot section of the chart.  Activities of daily living:  The patient is 100% independent in all ADLs: dressing, toileting, feeding as well as independent mobility  Home safety : The patient has smoke detectors in the home. They wear seatbelts.  There are no firearms at home. There is no violence in the home.   There is no risks for hepatitis, STDs or HIV. There is no   history of blood transfusion. They have no travel history to infectious disease endemic areas of the world.  The patient has seen their dentist in the last six month. They have seen their eye doctor in the last year. They admit to slight hearing difficulty with regard to whispered voices and some television programs.  They have deferred audiologic testing in the last year.  They do not  have excessive sun exposure. Discussed the need for sun protection: hats, long sleeves and use of sunscreen if there is significant sun exposure.   Diet: the importance of a healthy diet is discussed. They do have a healthy diet.  The benefits of regular aerobic exercise were discussed. She walks 4 times per week ,  20 minutes.   Depression screen: there are no signs or vegative symptoms of depression- irritability, change in appetite, anhedonia, sadness/tearfullness.  Cognitive assessment: the patient manages all their financial and personal affairs and is actively engaged. They could relate day,date,year and events; recalled 2/3 objects at 3 minutes; performed clock-face test normally.  The following portions of the patient's history were reviewed and updated as appropriate: allergies, current medications, past  family history, past medical history,  past surgical history, past social history  and problem list.  Visual acuity was not assessed per patient preference since she has regular follow up with her ophthalmologist. Hearing and body mass index were assessed and reviewed.   During the course of the visit the patient was educated and counseled about appropriate screening and preventive services including : fall prevention , diabetes screening, nutrition counseling, colorectal cancer screening, and recommended immunizations.    CC: The primary encounter diagnosis was Hyperlipidemia. Diagnoses of Need for hepatitis C screening test, Vitamin D deficiency, Other fatigue, Need for prophylactic vaccination against Streptococcus pneumoniae (pneumococcus), Obesity, and Medicare annual wellness visit, subsequent were also pertinent to this visit.  History Tiffany Benjamin has a past medical history of History of chicken pox; Depression; Ulcer; Family history of polyps in the colon; UTI (urinary tract infection); Arthritis; Hypertension; Hyperlipidemia; and History of colonic polyps.   She has past surgical history that includes Breast surgery (Right, 2001).   Her family history includes Cancer in her sister; Cancer (age of onset: 6281) in her sister; Diabetes in her mother; Heart attack (age of onset: 262) in her father; Heart disease in her father; Hyperlipidemia in her son; Hypertension in her mother; Stroke (age of onset: 4754) in her mother.She reports that she has never smoked. She has never used smokeless tobacco. She reports that she drinks about 1.8 oz of alcohol per week. She reports that she does not use illicit drugs.  Outpatient Prescriptions Prior to Visit  Medication Sig Dispense Refill  . amLODipine (  NORVASC) 5 MG tablet TAKE 1 TABLET BY MOUTH DAILY. 90 tablet 1  . aspirin 81 MG tablet Take 81 mg by mouth daily.    . metoprolol succinate (TOPROL-XL) 50 MG 24 hr tablet TAKE 1 TABLET BY MOUTH DAILY TAKE WITH OR  IMMEDIATELY FOLLOWING A MEAL. 90 tablet 1  . simvastatin (ZOCOR) 20 MG tablet TAKE 1 TABLET (20 MG TOTAL) BY MOUTH EVERY EVENING. 90 tablet 1  . traMADol (ULTRAM) 50 MG tablet Take 1 tablet (50 mg total) by mouth every 8 (eight) hours as needed. 90 tablet 0   No facility-administered medications prior to visit.    Review of Systems  Patient denies headache, fevers, malaise, unintentional weight loss, skin rash, eye pain, sinus congestion and sinus pain, sore throat, dysphagia,  hemoptysis , cough, dyspnea, wheezing, chest pain, palpitations, orthopnea, edema, abdominal pain, nausea, melena, diarrhea, constipation, flank pain, dysuria, hematuria, urinary  Frequency, nocturia, numbness, tingling, seizures,  Focal weakness, Loss of consciousness,  Tremor, insomnia, depression, anxiety, and suicidal ideation.     Objective:  BP 166/100 mmHg  Pulse 59  Temp(Src) 97.8 F (36.6 C) (Oral)  Resp 14  Ht 5' 2.5" (1.588 m)  Wt 169 lb (76.658 kg)  BMI 30.40 kg/m2  SpO2 98%  Physical Exam   General appearance: alert, cooperative and appears stated age Head: Normocephalic, without obvious abnormality, atraumatic Eyes: conjunctivae/corneas clear. PERRL, EOM's intact. Fundi benign. Ears: normal TM's and external ear canals both ears Nose: Nares normal. Septum midline. Mucosa normal. No drainage or sinus tenderness. Throat: lips, mucosa, and tongue normal; teeth and gums normal Neck: no adenopathy, no carotid bruit, no JVD, supple, symmetrical, trachea midline and thyroid not enlarged, symmetric, no tenderness/mass/nodules Lungs: clear to auscultation bilaterally Breasts: normal appearance, no masses or tenderness Heart: regular rate and rhythm, S1, S2 normal, no murmur, click, rub or gallop Abdomen: soft, non-tender; bowel sounds normal; no masses,  no organomegaly Extremities: extremities normal, atraumatic, no cyanosis or edema Pulses: 2+ and symmetric Skin: Skin color, texture, turgor normal.  No rashes or lesions Neurologic: Alert and oriented X 3, normal strength and tone. Normal symmetric reflexes. Normal coordination and gait.   Assessment & Plan:   Problem List Items Addressed This Visit    Hyperlipidemia - Primary    LDL and triglycerides are at goal on current medications. He has no side effects and liver enzymes are normal. No changes today  Lab Results  Component Value Date   CHOL 210* 02/19/2015   HDL 105.90 02/19/2015   LDLCALC 88 02/19/2015   TRIG 82.0 02/19/2015   CHOLHDL 2 02/19/2015   Lab Results  Component Value Date   ALT 17 02/19/2015   AST 17 02/19/2015   ALKPHOS 69 02/19/2015   BILITOT 0.7 02/19/2015         Relevant Orders   Lipid panel (Completed)   Medicare annual wellness visit, subsequent    Annual Medicare wellness  exam was done as well as a comprehensive physical exam and management of acute and chronic conditions .  During the course of the visit the patient was educated and counseled about appropriate screening and preventive services including : fall prevention , diabetes screening, nutrition counseling, colorectal cancer screening, and recommended immunizations.  Printed recommendations for health maintenance screenings was given.       Obesity    I have addressed  BMI and recommended wt loss of 10% of body weigh over the next 6 months using a low glycemic index diet and  regular exercise a minimum of 5 days per week. Silver Sneakers programs recommended.         Other Visit Diagnoses    Need for hepatitis C screening test        Relevant Orders    Hepatitis C antibody (Completed)    Vitamin D deficiency        Relevant Orders    Vit D  25 hydroxy (rtn osteoporosis monitoring) (Completed)    Other fatigue        Relevant Orders    CBC with Differential/Platelet (Completed)    Comprehensive metabolic panel (Completed)    TSH (Completed)    Need for prophylactic vaccination against Streptococcus pneumoniae (pneumococcus)         Relevant Orders    Pneumococcal conjugate vaccine 13-valent (Completed)       I am having Ms. Zettlemoyer maintain her aspirin, simvastatin, amLODipine, traMADol, and metoprolol succinate.  No orders of the defined types were placed in this encounter.    There are no discontinued medications.  Follow-up: Return in about 6 months (around 08/22/2015).   Sherlene ShamsULLO, Ahlia Lemanski L, MD

## 2015-02-20 LAB — HEPATITIS C ANTIBODY: HCV Ab: NEGATIVE

## 2015-02-22 ENCOUNTER — Encounter: Payer: Self-pay | Admitting: Internal Medicine

## 2015-02-22 DIAGNOSIS — E669 Obesity, unspecified: Secondary | ICD-10-CM | POA: Insufficient documentation

## 2015-02-22 HISTORY — DX: Obesity, unspecified: E66.9

## 2015-02-22 NOTE — Assessment & Plan Note (Signed)
LDL and triglycerides are at goal on current medications. He has no side effects and liver enzymes are normal. No changes today  Lab Results  Component Value Date   CHOL 210* 02/19/2015   HDL 105.90 02/19/2015   LDLCALC 88 02/19/2015   TRIG 82.0 02/19/2015   CHOLHDL 2 02/19/2015   Lab Results  Component Value Date   ALT 17 02/19/2015   AST 17 02/19/2015   ALKPHOS 69 02/19/2015   BILITOT 0.7 02/19/2015

## 2015-02-22 NOTE — Assessment & Plan Note (Signed)

## 2015-02-22 NOTE — Assessment & Plan Note (Signed)
I have addressed  BMI and recommended wt loss of 10% of body weigh over the next 6 months using a low glycemic index diet and regular exercise a minimum of 5 days per week. Silver Sneakers programs recommended.

## 2015-02-24 ENCOUNTER — Encounter: Payer: Self-pay | Admitting: *Deleted

## 2015-03-31 ENCOUNTER — Telehealth: Payer: Self-pay | Admitting: *Deleted

## 2015-03-31 DIAGNOSIS — H539 Unspecified visual disturbance: Secondary | ICD-10-CM

## 2015-03-31 NOTE — Telephone Encounter (Signed)
Referral is in process as requested to [facility]

## 2015-03-31 NOTE — Telephone Encounter (Signed)
Pt went to [facility] for her appoint.  Was not able to be seen due to insurance, she now needs a referral from her PCP.  Please advise

## 2015-04-19 ENCOUNTER — Other Ambulatory Visit: Payer: Self-pay | Admitting: Internal Medicine

## 2015-04-22 ENCOUNTER — Other Ambulatory Visit: Payer: Self-pay | Admitting: Internal Medicine

## 2015-04-23 DIAGNOSIS — H25013 Cortical age-related cataract, bilateral: Secondary | ICD-10-CM | POA: Diagnosis not present

## 2015-05-17 ENCOUNTER — Other Ambulatory Visit: Payer: Self-pay | Admitting: Internal Medicine

## 2015-08-20 ENCOUNTER — Other Ambulatory Visit: Payer: Commercial Managed Care - HMO

## 2015-08-22 ENCOUNTER — Ambulatory Visit: Payer: Commercial Managed Care - HMO | Admitting: Internal Medicine

## 2015-09-06 ENCOUNTER — Other Ambulatory Visit: Payer: Self-pay | Admitting: Internal Medicine

## 2015-10-21 ENCOUNTER — Encounter: Payer: Self-pay | Admitting: Family Medicine

## 2015-10-21 ENCOUNTER — Ambulatory Visit (INDEPENDENT_AMBULATORY_CARE_PROVIDER_SITE_OTHER): Payer: Commercial Managed Care - HMO | Admitting: Family Medicine

## 2015-10-21 VITALS — BP 138/88 | HR 59 | Temp 98.1°F | Ht 62.5 in | Wt 161.5 lb

## 2015-10-21 DIAGNOSIS — M1710 Unilateral primary osteoarthritis, unspecified knee: Secondary | ICD-10-CM

## 2015-10-21 DIAGNOSIS — M179 Osteoarthritis of knee, unspecified: Secondary | ICD-10-CM

## 2015-10-21 DIAGNOSIS — M25561 Pain in right knee: Secondary | ICD-10-CM

## 2015-10-21 MED ORDER — METHYLPREDNISOLONE ACETATE 80 MG/ML IJ SUSP
80.0000 mg | Freq: Once | INTRAMUSCULAR | Status: AC
  Administered 2015-10-21: 80 mg via INTRA_ARTICULAR

## 2015-10-21 NOTE — Progress Notes (Signed)
Pre visit review using our clinic review tool, if applicable. No additional management support is needed unless otherwise documented below in the visit note. 

## 2015-10-21 NOTE — Patient Instructions (Signed)
It was nice to see you today.  Continue the Tylenol as needed.  Please let us know if you would like an orthopedic referral to discuss surgery.  Take care  Dr. Adriana Simas

## 2015-10-21 NOTE — Assessment & Plan Note (Addendum)
Established problem, worsening. Recent office visit with sports medicine review today. In summary, patients history and x-rays were consistent with osteoarthritis. She was given an injection at that visit (01/2015). X-ray from 03/04/14 was reviewed independently today. Interpretation: Severe OA of the right knee. Patient with worsening pain. After discussion of treatment options, she elected to proceed with a steroid injection injection today. Injection was performed without difficulty. I encouraged the patient to follow-up closely. She will ultimately need a knee replacement.

## 2015-10-21 NOTE — Progress Notes (Signed)
Subjective:  Patient ID: Tiffany Benjamin, female    DOB: [birth year]  Age: 82 y.o. MRN: [id number]  CC: Right knee pain  HPI:  82 year old female with a past medical history of knee OA presents with right knee pain.  Right knee pain  Upon review of the R medical record it appears the patient has long-standing history of severe knee OA.  She presents today with complaints of right knee pain.  Knee pain is located diffusely throughout the right knee.  It is been worse the past 2 days.  No reported fall, trauma, injury.  No known inciting event.  She's been taking Tylenol with no relief.  Exacerbated by activity.  Social Hx   Social History   Social History  . Marital Status: Widowed    Spouse Name: N/A  . Number of Children: N/A  . Years of Education: N/A   Social History Main Topics  . Smoking status: Never Smoker   . Smokeless tobacco: Never Used  . Alcohol Use: 1.8 oz/week    3 Glasses of wine per week     Comment: occasional  . Drug Use: No  . Sexual Activity: Not Asked   Other Topics Concern  . None   Social History Narrative   Social:  Her husband died about 20 years ago from throat cancer.  She then cared for her father for  20 yrs in her home before he passed.    She is retired 20 yrs from [employer]. 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 Likes to stay busy.  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   Exercises regularly, goes to [employer] 3 days a week and stretches before hand.         Review of Systems  Constitutional: Negative.   Musculoskeletal:       Knee pain.   Objective:  BP 138/88 mmHg  Pulse 59  Temp(Src) 98.1 F (36.7 C)  Ht 5' 2.5" (1.588 m)  Wt 161 lb 8 oz (73.256 kg)  BMI 29.05 kg/m2  SpO2 97%  BP/Weight 10/21/2015 02/19/2015 01/15/2015  Systolic BP 138 166 160  Diastolic BP 88 100 88  Wt. (Lbs) 161.5 169 173.5  BMI 29.05 30.4 32.8   Physical Exam  Constitutional: She is oriented to person, place,  and time. She appears well-developed. No distress.  Pulmonary/Chest: Effort normal.  Musculoskeletal:  Knee: Right Inspection: No obvious effusion. No erythema. Palpation normal with no warmth, joint line tenderness. ROM decreased in flexion and extension. Ligaments intact.  Neurological: She is alert and oriented to person, place, and time.  Psychiatric: She has a normal mood and affect.  Vitals reviewed.  Lab Results  Component Value Date   WBC 6.2 02/19/2015   HGB 13.9 02/19/2015   HCT 40.9 02/19/2015   PLT 257.0 02/19/2015   GLUCOSE 82 02/19/2015   CHOL 210* 02/19/2015   TRIG 82.0 02/19/2015   HDL 105.90 02/19/2015   LDLCALC 88 02/19/2015   ALT 17 02/19/2015   AST 17 02/19/2015   NA 141 02/19/2015   K 4.4 02/19/2015   CL 103 02/19/2015   CREATININE 0.98 02/19/2015   BUN 17 02/19/2015   CO2 31 02/19/2015   TSH 0.74 02/19/2015    Assessment & Plan:   Problem List Items Addressed This Visit    Tricompartment Severe Knee OA, Bilateral    Established problem, worsening. Recent office visit with sports medicine review today. In summary, patients  history and x-rays were consistent with osteoarthritis. She was given an injection at that visit (01/2015). X-ray from 03/04/14 was reviewed independently today. Interpretation: Severe OA of the right knee. Patient with worsening pain. After discussion of treatment options, she elected to proceed with an injection today. Injection was performed without difficulty. I encouraged the patient to follow-up closely. She will ultimately need a knee replacement.      Relevant Medications   methylPREDNISolone acetate (DEPO-MEDROL) injection 80 mg (Completed)    Other Visit Diagnoses    Right knee pain    -  Primary    Relevant Medications    methylPREDNISolone acetate (DEPO-MEDROL) injection 80 mg (Completed)       Meds ordered this encounter  Medications  . methylPREDNISolone acetate (DEPO-MEDROL) injection 80 mg    Sig:     Procedure: Right knee injection Consent signed and scanned into record. Medication:  80 mg of Depo-Medrol and 4 mL of 1% Lidocaine w/o epi Preparation: area cleansed with alcohol x 3 Injection  Landmarks identified Above medication injected using a standard anterior medial approach. Patient tolerated well without bleeding or paresthesias   Follow-up: PRN  Everlene Other DO [facility]

## 2015-11-06 ENCOUNTER — Ambulatory Visit: Payer: Self-pay | Admitting: Internal Medicine

## 2015-11-25 ENCOUNTER — Ambulatory Visit (INDEPENDENT_AMBULATORY_CARE_PROVIDER_SITE_OTHER): Payer: Commercial Managed Care - HMO | Admitting: Internal Medicine

## 2015-11-25 ENCOUNTER — Encounter: Payer: Self-pay | Admitting: Internal Medicine

## 2015-11-25 VITALS — BP 160/86 | HR 58 | Temp 98.2°F | Resp 12 | Ht 63.0 in | Wt 158.2 lb

## 2015-11-25 DIAGNOSIS — Z1239 Encounter for other screening for malignant neoplasm of breast: Secondary | ICD-10-CM | POA: Diagnosis not present

## 2015-11-25 DIAGNOSIS — Z79899 Other long term (current) drug therapy: Secondary | ICD-10-CM | POA: Diagnosis not present

## 2015-11-25 DIAGNOSIS — I10 Essential (primary) hypertension: Secondary | ICD-10-CM | POA: Diagnosis not present

## 2015-11-25 DIAGNOSIS — E785 Hyperlipidemia, unspecified: Secondary | ICD-10-CM

## 2015-11-25 DIAGNOSIS — Z23 Encounter for immunization: Secondary | ICD-10-CM | POA: Diagnosis not present

## 2015-11-25 DIAGNOSIS — M25562 Pain in left knee: Secondary | ICD-10-CM

## 2015-11-25 DIAGNOSIS — M25561 Pain in right knee: Secondary | ICD-10-CM

## 2015-11-25 MED ORDER — TRAMADOL HCL 50 MG PO TABS: 50.0000 mg | ORAL_TABLET | Freq: Three times a day (TID) | ORAL | Status: DC | PRN

## 2015-11-25 NOTE — Patient Instructions (Addendum)
I am refilling your tramadol for continued use as needed for knee pain  We are checking your liver enzymes today since you take simvastatin    I will confirm your mammogram was done Oct 2016  Your last wellnessEXAM WAS IN   2014

## 2015-11-25 NOTE — Progress Notes (Signed)
Subjective:  Patient ID: Tiffany Benjamin, female    DOB: 08-08-1933  Age: 82 y.o. MRN: [id number]  CC: The primary encounter diagnosis was Long-term use of high-risk medication. Diagnoses of Breast cancer screening, Encounter for immunization, Essential hypertension, Knee pain, bilateral, and Hyperlipidemia were also pertinent to this visit.  HPI Tiffany Benjamin presents for follow up on HYPERTENSION  hyperlipidemia and CHRONIC  bilateral knee pain.  Has severe OA and has seen orthopedist in the past  And was advised to consider surgery.   Had a steroid injection last month by Dr Lacinda Axon.. No relief,  Recalls that previous injection gave her 8 months of pain relief.  Using tylenol but not daily,  Not using tramadol more than twice weekly .  Gors to [employer] 3 days per week and feels .  Had mammogram Oct 2016 at [place].  Letter received, by patient.    HTN:  Home bps 1432/68,  135/80   Outpatient Prescriptions Prior to Visit  Medication Sig Dispense Refill  . amLODipine (NORVASC) 5 MG tablet TAKE 1 TABLET BY MOUTH DAILY. 90 tablet 1  . aspirin 81 MG tablet Take 81 mg by mouth daily.    . metoprolol succinate (TOPROL-XL) 50 MG 24 hr tablet TAKE 1 TABLET BY MOUTH DAILY TAKE WITH OR IMMEDIATELY FOLLOWING A MEAL. 90 tablet 1  . simvastatin (ZOCOR) 20 MG tablet TAKE 1 TABLET (20 MG TOTAL) BY MOUTH EVERY EVENING. 90 tablet 1  . traMADol (ULTRAM) 50 MG tablet Take 1 tablet (50 mg total) by mouth every 8 (eight) hours as needed. 90 tablet 0  . amLODipine (NORVASC) 5 MG tablet TAKE 1 TABLET BY MOUTH DAILY. 90 tablet 1  . amLODipine (NORVASC) 5 MG tablet TAKE 1 TABLET BY MOUTH DAILY. 90 tablet 1  . simvastatin (ZOCOR) 20 MG tablet TAKE 1 TABLET (20 MG TOTAL) BY MOUTH EVERY EVENING. 90 tablet 1   No facility-administered medications prior to visit.    Review of Systems;  Patient denies headache, fevers, malaise, unintentional weight loss, skin rash, eye pain, sinus congestion and sinus pain, sore  throat, dysphagia,  hemoptysis , cough, dyspnea, wheezing, chest pain, palpitations, orthopnea, edema, abdominal pain, nausea, melena, diarrhea, constipation, flank pain, dysuria, hematuria, urinary  Frequency, nocturia, numbness, tingling, seizures,  Focal weakness, Loss of consciousness,  Tremor, insomnia, depression, anxiety, and suicidal ideation.      Objective:  BP 160/86 mmHg  Pulse 58  Temp(Src) 98.2 F (36.8 C) (Oral)  Resp 12  Ht '5\' 3"'  (1.6 m)  Wt 158 lb 4 oz (71.782 kg)  BMI 28.04 kg/m2  SpO2 97%  BP Readings from Last 3 Encounters:  11/25/15 160/86  10/21/15 138/88  02/19/15 166/100    Wt Readings from Last 3 Encounters:  11/25/15 158 lb 4 oz (71.782 kg)  10/21/15 161 lb 8 oz (73.256 kg)  02/19/15 169 lb (76.658 kg)    General appearance: alert, cooperative and appears stated age Ears: normal TM's and external ear canals both ears Throat: lips, mucosa, and tongue normal; teeth and gums normal Neck: no adenopathy, no carotid bruit, supple, symmetrical, trachea midline and thyroid not enlarged, symmetric, no tenderness/mass/nodules Back: symmetric, no curvature. ROM normal. No CVA tenderness. Lungs: clear to auscultation bilaterally Heart: regular rate and rhythm, S1, S2 normal, no murmur, click, rub or gallop Abdomen: soft, non-tender; bowel sounds normal; no masses,  no organomegaly Pulses: 2+ and symmetric Skin: Skin color, texture, turgor normal. No rashes or lesions Lymph nodes: Cervical, supraclavicular,  and axillary nodes normal.  No results found for: HGBA1C  Lab Results  Component Value Date   CREATININE 1.20 11/25/2015   CREATININE 0.98 02/19/2015   CREATININE 1.1 12/29/2012    Lab Results  Component Value Date   WBC 6.2 02/19/2015   HGB 13.9 02/19/2015   HCT 40.9 02/19/2015   PLT 257.0 02/19/2015   GLUCOSE 120* 11/25/2015   CHOL 210* 02/19/2015   TRIG 82.0 02/19/2015   HDL 105.90 02/19/2015   LDLCALC 88 02/19/2015   ALT 15 11/25/2015    AST 20 11/25/2015   NA 141 11/25/2015   K 4.1 11/25/2015   CL 104 11/25/2015   CREATININE 1.20 11/25/2015   BUN 16 11/25/2015   CO2 29 11/25/2015   TSH 0.74 02/19/2015    No results found.  Assessment & Plan:   Problem List Items Addressed This Visit    Hypertension    Well controlled on current regimen. Renal function stable, no changes today.  Lab Results  Component Value Date   CREATININE 1.20 11/25/2015   Lab Results  Component Value Date   NA 141 11/25/2015   K 4.1 11/25/2015   CL 104 11/25/2015   CO2 29 11/25/2015         Hyperlipidemia    Managed with simvastatin.  sHe has no side effects and liver enzymes are normal. No changes today  Lab Results  Component Value Date   CHOL 210* 02/19/2015   HDL 105.90 02/19/2015   LDLCALC 88 02/19/2015   TRIG 82.0 02/19/2015   CHOLHDL 2 02/19/2015   Lab Results  Component Value Date   ALT 15 11/25/2015   AST 20 11/25/2015   ALKPHOS 67 11/25/2015   BILITOT 0.3 11/25/2015   Lab Results  Component Value Date   LDLCALC 88 02/19/2015            Knee pain, bilateral    Managed with tylenol and tramadol  prn.  No relief with recent steroid injection.  Can return in 5 months for another steroid injection         Other Visit Diagnoses    Long-term use of high-risk medication    -  Primary    Relevant Orders    Comp Met (CMET) (Completed)    Breast cancer screening        Relevant Orders    MM DIGITAL SCREENING BILATERAL    Encounter for immunization        Relevant Orders    Flu vaccine HIGH DOSE PF      A total of 25 minutes of face to face time was spent with patient more than half of which was spent in counselling about the above mentioned conditions  and coordination of care  I am having Ms. Larch maintain her aspirin, amLODipine, simvastatin, metoprolol succinate, and traMADol.  Meds ordered this encounter  Medications  . traMADol (ULTRAM) 50 MG tablet    Sig: Take 1 tablet (50 mg total) by  mouth every 8 (eight) hours as needed.    Dispense:  90 tablet    Refill:  0    Medications Discontinued During This Encounter  Medication Reason  . amLODipine (NORVASC) 5 MG tablet Error  . amLODipine (NORVASC) 5 MG tablet Duplicate  . simvastatin (ZOCOR) 20 MG tablet Duplicate  . traMADol (ULTRAM) 50 MG tablet Reorder    Follow-up: Return in about 3 months (around 02/22/2016) for [place] annual exam .   Crecencio Mc, MD

## 2015-11-26 LAB — COMPREHENSIVE METABOLIC PANEL
ALT: 15 U/L (ref 0–35)
AST: 20 U/L (ref 0–37)
Albumin: 4.1 g/dL (ref 3.5–5.2)
Alkaline Phosphatase: 67 U/L (ref 39–117)
BUN: 16 mg/dL (ref 6–23)
CHLORIDE: 104 meq/L (ref 96–112)
CO2: 29 meq/L (ref 19–32)
Calcium: 9.2 mg/dL (ref 8.4–10.5)
Creatinine, Ser: 1.2 mg/dL (ref 0.40–1.20)
GFR: 55.45 mL/min — ABNORMAL LOW (ref >60.00)
Glucose, Bld: 120 mg/dL — ABNORMAL HIGH (ref 70–99)
Potassium: 4.1 mEq/L (ref 3.5–5.1)
SODIUM: 141 meq/L (ref 135–145)
Total Bilirubin: 0.3 mg/dL (ref 0.2–1.2)
Total Protein: 7.2 g/dL (ref 6.0–8.3)

## 2015-11-26 NOTE — Assessment & Plan Note (Signed)
Well controlled on current regimen. Renal function stable, no changes today.  Lab Results  Component Value Date   CREATININE 1.20 11/25/2015   Lab Results  Component Value Date   NA 141 11/25/2015   K 4.1 11/25/2015   CL 104 11/25/2015   CO2 29 11/25/2015

## 2015-11-26 NOTE — Assessment & Plan Note (Signed)
Managed with simvastatin.  sHe has no side effects and liver enzymes are normal. No changes today  Lab Results  Component Value Date   CHOL 210* 02/19/2015   HDL 105.90 02/19/2015   LDLCALC 88 02/19/2015   TRIG 82.0 02/19/2015   CHOLHDL 2 02/19/2015   Lab Results  Component Value Date   ALT 15 11/25/2015   AST 20 11/25/2015   ALKPHOS 67 11/25/2015   BILITOT 0.3 11/25/2015   Lab Results  Component Value Date   LDLCALC 88 02/19/2015

## 2015-11-26 NOTE — Assessment & Plan Note (Signed)
Managed with tylenol and tramadol  prn.  No relief with recent steroid injection.  Can return in 5 months for another steroid injection

## 2015-11-28 ENCOUNTER — Encounter: Payer: Self-pay | Admitting: *Deleted

## 2016-01-28 ENCOUNTER — Other Ambulatory Visit: Payer: Self-pay | Admitting: Internal Medicine

## 2016-03-02 ENCOUNTER — Ambulatory Visit (INDEPENDENT_AMBULATORY_CARE_PROVIDER_SITE_OTHER): Payer: Commercial Managed Care - HMO | Admitting: Internal Medicine

## 2016-03-02 ENCOUNTER — Encounter: Payer: Self-pay | Admitting: Internal Medicine

## 2016-03-02 VITALS — BP 164/92 | HR 59 | Temp 98.3°F | Resp 12 | Ht 61.5 in | Wt 166.2 lb

## 2016-03-02 DIAGNOSIS — R5383 Other fatigue: Secondary | ICD-10-CM | POA: Diagnosis not present

## 2016-03-02 DIAGNOSIS — E559 Vitamin D deficiency, unspecified: Secondary | ICD-10-CM

## 2016-03-02 DIAGNOSIS — E785 Hyperlipidemia, unspecified: Secondary | ICD-10-CM

## 2016-03-02 DIAGNOSIS — I10 Essential (primary) hypertension: Secondary | ICD-10-CM | POA: Diagnosis not present

## 2016-03-02 DIAGNOSIS — Z0000 Encounter for general adult medical examination without abnormal findings: Secondary | ICD-10-CM | POA: Insufficient documentation

## 2016-03-02 LAB — COMPREHENSIVE METABOLIC PANEL
ALK PHOS: 64 U/L (ref 39–117)
ALT: 14 U/L (ref 0–35)
AST: 17 U/L (ref 0–37)
Albumin: 4.3 g/dL (ref 3.5–5.2)
BUN: 15 mg/dL (ref 6–23)
CHLORIDE: 104 meq/L (ref 96–112)
CO2: 30 mEq/L (ref 19–32)
Calcium: 9.7 mg/dL (ref 8.4–10.5)
Creatinine, Ser: 0.96 mg/dL (ref 0.40–1.20)
GFR: 71.69 mL/min (ref >60.00)
GLUCOSE: 82 mg/dL (ref 70–99)
POTASSIUM: 4.2 meq/L (ref 3.5–5.1)
SODIUM: 141 meq/L (ref 135–145)
TOTAL PROTEIN: 7.4 g/dL (ref 6.0–8.3)
Total Bilirubin: 0.5 mg/dL (ref 0.2–1.2)

## 2016-03-02 LAB — LIPID PANEL
CHOL/HDL RATIO: 2
Cholesterol: 205 mg/dL — ABNORMAL HIGH (ref 0–200)
HDL: 87.8 mg/dL (ref >39.00)
LDL CALC: 99 mg/dL (ref 0–99)
NONHDL: 117.46
TRIGLYCERIDES: 93 mg/dL (ref 0.0–149.0)
VLDL: 18.6 mg/dL (ref 0.0–40.0)

## 2016-03-02 LAB — CBC WITH DIFFERENTIAL/PLATELET
BASOS ABS: 0 10*3/uL (ref 0.0–0.1)
BASOS PCT: 0.4 % (ref 0.0–3.0)
EOS ABS: 0.2 10*3/uL (ref 0.0–0.7)
Eosinophils Relative: 2.9 % (ref 0.0–5.0)
HEMATOCRIT: 38.5 % (ref 36.0–46.0)
HEMOGLOBIN: 12.7 g/dL (ref 12.0–15.0)
LYMPHS PCT: 40 % (ref 12.0–46.0)
Lymphs Abs: 2.7 10*3/uL (ref 0.7–4.0)
MCHC: 32.9 g/dL (ref 30.0–36.0)
MCV: 97.4 fl (ref 78.0–100.0)
MONOS PCT: 8.5 % (ref 3.0–12.0)
Monocytes Absolute: 0.6 10*3/uL (ref 0.1–1.0)
NEUTROS ABS: 3.3 10*3/uL (ref 1.4–7.7)
Neutrophils Relative %: 48.2 % (ref 43.0–77.0)
Platelets: 274 10*3/uL (ref 150.0–400.0)
RBC: 3.96 Mil/uL (ref 3.87–5.11)
RDW: 15.3 % (ref 11.5–15.5)
WBC: 6.8 10*3/uL (ref 4.0–10.5)

## 2016-03-02 LAB — TSH: TSH: 0.72 u[IU]/mL (ref 0.35–4.50)

## 2016-03-02 LAB — VITAMIN D 25 HYDROXY (VIT D DEFICIENCY, FRACTURES): VITD: 68.99 ng/mL (ref 30.00–100.00)

## 2016-03-02 LAB — LDL CHOLESTEROL, DIRECT: Direct LDL: 88 mg/dL

## 2016-03-02 NOTE — Patient Instructions (Signed)
PLEASE bring your home BP monitor back with you to your appointment with Denisa   We will measure your BP simultaneously with your machine and ours to make sure your home readings are accurate  You have decided to defer annual mammograms.  Your last one was October 2015  We will order the  Cologuard home stool test instead of colonoscopy  Menopause is a normal process in which your reproductive ability comes to an end. This process happens gradually over a span of months to years, usually between the ages of 15 and 52. Menopause is complete when you have missed 12 consecutive menstrual periods. It is important to talk with your health care provider about some of the most common conditions that affect postmenopausal women, such as heart disease, cancer, and bone loss (osteoporosis). Adopting a healthy lifestyle and getting preventive care can help to promote your health and wellness. Those actions can also lower your chances of developing some of these common conditions. WHAT SHOULD I KNOW ABOUT MENOPAUSE? During menopause, you may experience a number of symptoms, such as:  Moderate-to-severe hot flashes.  Night sweats.  Decrease in sex drive.  Mood swings.  Headaches.  Tiredness.  Irritability.  Memory problems.  Insomnia. Choosing to treat or not to treat menopausal changes is an individual decision that you make with your health care provider. WHAT SHOULD I KNOW ABOUT HORMONE REPLACEMENT THERAPY AND SUPPLEMENTS? Hormone therapy products are effective for treating symptoms that are associated with menopause, such as hot flashes and night sweats. Hormone replacement carries certain risks, especially as you become older. If you are thinking about using estrogen or estrogen with progestin treatments, discuss the benefits and risks with your health care provider. WHAT SHOULD I KNOW ABOUT HEART DISEASE AND STROKE? Heart disease, heart attack, and stroke become more likely as you age.  This may be due, in part, to the hormonal changes that your body experiences during menopause. These can affect how your body processes dietary fats, triglycerides, and cholesterol. Heart attack and stroke are both medical emergencies. There are many things that you can do to help prevent heart disease and stroke:  Have your blood pressure checked at least every 1-2 years. High blood pressure causes heart disease and increases the risk of stroke.  If you are 44-26 years old, ask your health care provider if you should take aspirin to prevent a heart attack or a stroke.  Do not use any tobacco products, including cigarettes, chewing tobacco, or electronic cigarettes. If you need help quitting, ask your health care provider.  It is important to eat a healthy diet and maintain a healthy weight.  Be sure to include plenty of vegetables, fruits, low-fat dairy products, and lean protein.  Avoid eating foods that are high in solid fats, added sugars, or salt (sodium).  Get regular exercise. This is one of the most important things that you can do for your health.  Try to exercise for at least 150 minutes each week. The type of exercise that you do should increase your heart rate and make you sweat. This is known as moderate-intensity exercise.  Try to do strengthening exercises at least twice each week. Do these in addition to the moderate-intensity exercise.  Know your numbers.Ask your health care provider to check your cholesterol and your blood glucose. Continue to have your blood tested as directed by your health care provider. WHAT SHOULD I KNOW ABOUT CANCER SCREENING? There are several types of cancer. Take the following steps  to reduce your risk and to catch any cancer development as early as possible. Breast Cancer  Practice breast self-awareness.  This means understanding how your breasts normally appear and feel.  It also means doing regular breast self-exams. Let your health care  provider know about any changes, no matter how small.  If you are 43 or older, have a clinician do a breast exam (clinical breast exam or CBE) every year. Depending on your age, family history, and medical history, it may be recommended that you also have a yearly breast X-ray (mammogram).  If you have a family history of breast cancer, talk with your health care provider about genetic screening.  If you are at high risk for breast cancer, talk with your health care provider about having an MRI and a mammogram every year.  Breast cancer (BRCA) gene test is recommended for women who have family members with BRCA-related cancers. Results of the assessment will determine the need for genetic counseling and BRCA1 and for BRCA2 testing. BRCA-related cancers include these types:  Breast. This occurs in males or females.  Ovarian.  Tubal. This may also be called fallopian tube cancer.  Cancer of the abdominal or pelvic lining (peritoneal cancer).  Prostate.  Pancreatic. Cervical, Uterine, and Ovarian Cancer Your health care provider may recommend that you be screened regularly for cancer of the pelvic organs. These include your ovaries, uterus, and vagina. This screening involves a pelvic exam, which includes checking for microscopic changes to the surface of your cervix (Pap test).  For women ages 21-65, health care providers may recommend a pelvic exam and a Pap test every three years. For women ages 21-65, they may recommend the Pap test and pelvic exam, combined with testing for human papilloma virus (HPV), every five years. Some types of HPV increase your risk of cervical cancer. Testing for HPV may also be done on women of any age who have unclear Pap test results.  Other health care providers may not recommend any screening for nonpregnant women who are considered low risk for pelvic cancer and have no symptoms. Ask your health care provider if a screening pelvic exam is right for  you.  If you have had past treatment for cervical cancer or a condition that could lead to cancer, you need Pap tests and screening for cancer for at least 20 years after your treatment. If Pap tests have been discontinued for you, your risk factors (such as having a new sexual partner) need to be reassessed to determine if you should start having screenings again. Some women have medical problems that increase the chance of getting cervical cancer. In these cases, your health care provider may recommend that you have screening and Pap tests more often.  If you have a family history of uterine cancer or ovarian cancer, talk with your health care provider about genetic screening.  If you have vaginal bleeding after reaching menopause, tell your health care provider.  There are currently no reliable tests available to screen for ovarian cancer. Lung Cancer Lung cancer screening is recommended for adults 18-16 years old who are at high risk for lung cancer because of a history of smoking. A yearly low-dose CT scan of the lungs is recommended if you:  Currently smoke.  Have a history of at least 30 pack-years of smoking and you currently smoke or have quit within the past 15 years. A pack-year is smoking an average of one pack of cigarettes per day for one year. Yearly  screening should:  Continue until it has been 15 years since you quit.  Stop if you develop a health problem that would prevent you from having lung cancer treatment. Colorectal Cancer  This type of cancer can be detected and can often be prevented.  Routine colorectal cancer screening usually begins at age 29 and continues through age 63.  If you have risk factors for colon cancer, your health care provider may recommend that you be screened at an earlier age.  If you have a family history of colorectal cancer, talk with your health care provider about genetic screening.  Your health care provider may also recommend using  home test kits to check for hidden blood in your stool.  A small camera at the end of a tube can be used to examine your colon directly (sigmoidoscopy or colonoscopy). This is done to check for the earliest forms of colorectal cancer.  Direct examination of the colon should be repeated every 5-10 years until age 60. However, if early forms of precancerous polyps or small growths are found or if you have a family history or genetic risk for colorectal cancer, you may need to be screened more often. Skin Cancer  Check your skin from head to toe regularly.  Monitor any moles. Be sure to tell your health care provider:  About any new moles or changes in moles, especially if there is a change in a mole's shape or color.  If you have a mole that is larger than the size of a pencil eraser.  If any of your family members has a history of skin cancer, especially at a young age, talk with your health care provider about genetic screening.  Always use sunscreen. Apply sunscreen liberally and repeatedly throughout the day.  Whenever you are outside, protect yourself by wearing long sleeves, pants, a wide-brimmed hat, and sunglasses. WHAT SHOULD I KNOW ABOUT OSTEOPOROSIS? Osteoporosis is a condition in which bone destruction happens more quickly than new bone creation. After menopause, you may be at an increased risk for osteoporosis. To help prevent osteoporosis or the bone fractures that can happen because of osteoporosis, the following is recommended:  If you are 84-74 years old, get at least 1,000 mg of calcium and at least 600 mg of vitamin D per day.  If you are older than age 38 but younger than age 66, get at least 1,200 mg of calcium and at least 600 mg of vitamin D per day.  If you are older than age 48, get at least 1,200 mg of calcium and at least 800 mg of vitamin D per day. Smoking and excessive alcohol intake increase the risk of osteoporosis. Eat foods that are rich in calcium and  vitamin D, and do weight-bearing exercises several times each week as directed by your health care provider. WHAT SHOULD I KNOW ABOUT HOW MENOPAUSE AFFECTS Farmer? Depression may occur at any age, but it is more common as you become older. Common symptoms of depression include:  Low or sad mood.  Changes in sleep patterns.  Changes in appetite or eating patterns.  Feeling an overall lack of motivation or enjoyment of activities that you previously enjoyed.  Frequent crying spells. Talk with your health care provider if you think that you are experiencing depression. WHAT SHOULD I KNOW ABOUT IMMUNIZATIONS? It is important that you get and maintain your immunizations. These include:  Tetanus, diphtheria, and pertussis (Tdap) booster vaccine.  Influenza every year before the flu season  begins.  Pneumonia vaccine.  Shingles vaccine. Your health care provider may also recommend other immunizations.   This information is not intended to replace advice given to you by your health care provider. Make sure you discuss any questions you have with your health care provider.   Document Released: 11/12/2005 Document Revised: 10/11/2014 Document Reviewed: 05/23/2014 Elsevier Interactive Patient Education [employer].

## 2016-03-02 NOTE — Progress Notes (Signed)
Patient ID: Tiffany Benjamin, female    DOB: 1932-10-25  Age: 82 y.o. MRN: [id number]  The patient is here for annual physical examination and management of other chronic and acute problems.    White coat hypertension.  Taking norvasc 5 mg and toprol 50 mg   Home readings 140/80 mostly   Occasional insomnia due to stress of caring for 83 yr old granddaughter . History of colonic polyps 2014, [facility] . Discussed cologuard. Last mammogram   2015,  Does not want one unless she feels  Fasting today  Taking tramadol and tylenol for knee pain    The risk factors are reflected in the social history.  The roster of all physicians providing medical care to patient - is listed in the Snapshot section of the chart.  Home safety : The patient has smoke detectors in the home. They wear seatbelts.  There are no firearms at home. There is no violence in the home.   There is no risks for hepatitis, STDs or HIV. There is no   history of blood transfusion. They have no travel history to infectious disease endemic areas of the world.  The patient has seen their dentist in the last six month. They have seen their eye doctor in the last year. They admit to slight hearing difficulty with regard to whispered voices and some television programs.  They have deferred audiologic testing in the last year.  They do not  have excessive sun exposure. Discussed the need for sun protection: hats, long sleeves and use of sunscreen if there is significant sun exposure.   Diet: the importance of a healthy diet is discussed. They do have a healthy diet.  The benefits of regular aerobic exercise were discussed. She walks 4 times per week ,  20 minutes.   Depression screen: there are no signs or vegative symptoms of depression- irritability, change in appetite, anhedonia, sadness/tearfullness.   The following portions of the patient's history were reviewed and updated as appropriate: allergies, current medications, past  family history, past medical history,  past surgical history, past social history  and problem list.  Visual acuity was not assessed per patient preference since she has regular follow up with her ophthalmologist. Hearing and body mass index were assessed and reviewed.   During the course of the visit the patient was educated and counseled about appropriate screening and preventive services including : fall prevention , diabetes screening, nutrition counseling, colorectal cancer screening, and recommended immunizations.    CC: The primary encounter diagnosis was Other fatigue. Diagnoses of Essential hypertension, Hyperlipidemia, Vitamin D deficiency, and Encounter for preventive health examination were also pertinent to this visit.  History Darin has a past medical history of History of chicken pox; Depression; Ulcer; Family history of polyps in the colon; UTI (urinary tract infection); Arthritis; Hypertension; Hyperlipidemia; and History of colonic polyps.   She has past surgical history that includes Breast surgery (Right, 2001).   Her family history includes Cancer in her sister; Cancer (age of onset: 30) in her sister; Diabetes in her mother; Heart attack (age of onset: 49) in her father; Heart disease in her father; Hyperlipidemia in her son; Hypertension in her mother; Stroke (age of onset: 70) in her mother.She reports that she has never smoked. She has never used smokeless tobacco. She reports that she drinks about 1.8 oz of alcohol per week. She reports that she does not use illicit drugs.  Outpatient Prescriptions Prior to Visit  Medication Sig Dispense Refill  .  amLODipine (NORVASC) 5 MG tablet TAKE 1 TABLET BY MOUTH DAILY. 90 tablet 1  . aspirin 81 MG tablet Take 81 mg by mouth daily.    . metoprolol succinate (TOPROL-XL) 50 MG 24 hr tablet TAKE 1 TABLET BY MOUTH DAILY TAKE WITH OR IMMEDIATELY FOLLOWING A MEAL. 90 tablet 1  . simvastatin (ZOCOR) 20 MG tablet TAKE 1 TABLET (20 MG  TOTAL) BY MOUTH EVERY EVENING. 90 tablet 1  . traMADol (ULTRAM) 50 MG tablet Take 1 tablet (50 mg total) by mouth every 8 (eight) hours as needed. 90 tablet 0  . amLODipine (NORVASC) 5 MG tablet TAKE 1 TABLET BY MOUTH DAILY. 90 tablet 1   No facility-administered medications prior to visit.    Review of Systems   Patient denies headache, fevers, malaise, unintentional weight loss, skin rash, eye pain, sinus congestion and sinus pain, sore throat, dysphagia,  hemoptysis , cough, dyspnea, wheezing, chest pain, palpitations, orthopnea, edema, abdominal pain, nausea, melena, diarrhea, constipation, flank pain, dysuria, hematuria, urinary  Frequency, nocturia, numbness, tingling, seizures,  Focal weakness, Loss of consciousness,  Tremor, insomnia, depression, anxiety, and suicidal ideation.      Objective:  BP 164/92 mmHg  Pulse 59  Temp(Src) 98.3 F (36.8 C) (Oral)  Resp 12  Ht 5' 1.5" (1.562 m)  Wt 166 lb 4 oz (75.411 kg)  BMI 30.91 kg/m2  SpO2 98%  Physical Exam   General appearance: alert, cooperative and appears stated age Head: Normocephalic, without obvious abnormality, atraumatic Eyes: conjunctivae/corneas clear. PERRL, EOM's intact. Fundi benign. Ears: normal TM's and external ear canals both ears Nose: Nares normal. Septum midline. Mucosa normal. No drainage or sinus tenderness. Throat: lips, mucosa, and tongue normal; teeth and gums normal Neck: no adenopathy, no carotid bruit, no JVD, supple, symmetrical, trachea midline and thyroid not enlarged, symmetric, no tenderness/mass/nodules Lungs: clear to auscultation bilaterally Breasts: normal appearance, no masses or tenderness Heart: regular rate and rhythm, S1, S2 normal, no murmur, click, rub or gallop Abdomen: soft, non-tender; bowel sounds normal; no masses,  no organomegaly Extremities: extremities normal, atraumatic, no cyanosis or edema Pulses: 2+ and symmetric Skin: Skin color, texture, turgor normal. No rashes or  lesions Neurologic: Alert and oriented X 3, normal strength and tone. Normal symmetric reflexes. Normal coordination and gait.     Assessment & Plan:   Problem List Items Addressed This Visit    Hypertension    She has  had normal readings at ome .  She has been asked to check her pressures at home and submit readings for evaluation. Renal function will be checked today  Lab Results  Component Value Date   CREATININE 0.96 03/02/2016   Lab Results  Component Value Date   NA 141 03/02/2016   K 4.2 03/02/2016   CL 104 03/02/2016   CO2 30 03/02/2016         Relevant Orders   Comprehensive metabolic panel (Completed)   Hyperlipidemia    Managed with simvastatin.  sHe has no side effects and liver enzymes are normal. No changes today  Lab Results  Component Value Date   CHOL 205* 03/02/2016   HDL 87.80 03/02/2016   LDLCALC 99 03/02/2016   LDLDIRECT 88.0 03/02/2016   TRIG 93.0 03/02/2016   CHOLHDL 2 03/02/2016   Lab Results  Component Value Date   ALT 14 03/02/2016   AST 17 03/02/2016   ALKPHOS 64 03/02/2016   BILITOT 0.5 03/02/2016   Lab Results  Component Value Date   LDLCALC 99  03/02/2016              Relevant Orders   Lipid panel (Completed)   LDL cholesterol, direct (Completed)   Encounter for preventive health examination    Annual comprehensive preventive exam was done as well as an evaluation and management of chronic conditions .  During the course of the visit the patient was educated and counseled about appropriate screening and preventive services including :  diabetes screening, lipid analysis with projected  10 year  risk for CAD , nutrition counseling, breast, cervical and colorectal cancer screening, and recommended immunizations.  Printed recommendations for health maintenance screenings was given.  She has decided to defer mammograms and colonoscopy in favor of of personal breast exams and cologuard.       Other Visit Diagnoses    Other  fatigue    -  Primary    Relevant Orders    TSH (Completed)    CBC with Differential/Platelet (Completed)    Vitamin D deficiency        Relevant Orders    VITAMIN D 25 Hydroxy (Vit-D Deficiency, Fractures) (Completed)       I am having Ms. Livesay maintain her aspirin, amLODipine, simvastatin, metoprolol succinate, and traMADol.  No orders of the defined types were placed in this encounter.    Medications Discontinued During This Encounter  Medication Reason  . amLODipine (NORVASC) 5 MG tablet Duplicate    Follow-up: No Follow-up on file.   Sherlene Shams, MD

## 2016-03-02 NOTE — Assessment & Plan Note (Signed)
Managed with simvastatin.  sHe has no side effects and liver enzymes are normal. No changes today  Lab Results  Component Value Date   CHOL 205* 03/02/2016   HDL 87.80 03/02/2016   LDLCALC 99 03/02/2016   LDLDIRECT 88.0 03/02/2016   TRIG 93.0 03/02/2016   CHOLHDL 2 03/02/2016   Lab Results  Component Value Date   ALT 14 03/02/2016   AST 17 03/02/2016   ALKPHOS 64 03/02/2016   BILITOT 0.5 03/02/2016   Lab Results  Component Value Date   LDLCALC 99 03/02/2016

## 2016-03-02 NOTE — Assessment & Plan Note (Signed)
Annual comprehensive preventive exam was done as well as an evaluation and management of chronic conditions .  During the course of the visit the patient was educated and counseled about appropriate screening and preventive services including :  diabetes screening, lipid analysis with projected  10 year  risk for CAD , nutrition counseling, breast, cervical and colorectal cancer screening, and recommended immunizations.  Printed recommendations for health maintenance screenings was given.  She has decided to defer mammograms and colonoscopy in favor of of personal breast exams and cologuard.

## 2016-03-02 NOTE — Assessment & Plan Note (Signed)
She has  had normal readings at ome .  She has been asked to check her pressures at home and submit readings for evaluation. Renal function will be checked today  Lab Results  Component Value Date   CREATININE 0.96 03/02/2016   Lab Results  Component Value Date   NA 141 03/02/2016   K 4.2 03/02/2016   CL 104 03/02/2016   CO2 30 03/02/2016

## 2016-03-02 NOTE — Progress Notes (Signed)
Pre-visit discussion using our clinic review tool. No additional management support is needed unless otherwise documented below in the visit note.  

## 2016-03-04 ENCOUNTER — Other Ambulatory Visit: Payer: Self-pay | Admitting: Internal Medicine

## 2016-03-08 ENCOUNTER — Encounter: Payer: Self-pay | Admitting: *Deleted

## 2016-03-09 NOTE — Progress Notes (Signed)
Cologuard ordered

## 2016-03-09 NOTE — Addendum Note (Signed)
Addended by: Henrene PastorAVIS, Geneieve Duell R on: 03/09/2016 10:26 AM   Modules accepted: Kipp BroodSmartSet

## 2016-03-10 ENCOUNTER — Ambulatory Visit (INDEPENDENT_AMBULATORY_CARE_PROVIDER_SITE_OTHER): Payer: Commercial Managed Care - HMO

## 2016-03-10 VITALS — BP 148/62 | HR 59 | Temp 98.4°F | Resp 12 | Ht 61.5 in | Wt 166.0 lb

## 2016-03-10 DIAGNOSIS — Z0000 Encounter for general adult medical examination without abnormal findings: Secondary | ICD-10-CM

## 2016-03-10 DIAGNOSIS — E2839 Other primary ovarian failure: Secondary | ICD-10-CM

## 2016-03-10 NOTE — Progress Notes (Signed)
Subjective:   Tiffany Benjamin is a 82 y.o. female who presents for Medicare Annual (Subsequent) preventive examination.  Review of Systems:  No ROS.  Medicare Wellness Visit.  Cardiac Risk Factors include: advanced age (>81mn, >>9women);hypertension     Objective:     Vitals: BP 148/62 mmHg  Pulse 59  Temp(Src) 98.4 F (36.9 C) (Oral)  Resp 12  Ht 5' 1.5" (1.562 m)  Wt 166 lb (75.297 kg)  BMI 30.86 kg/m2  SpO2 96%  Body mass index is 30.86 kg/(m^2).   Tobacco History  Smoking status  . Never Smoker   Smokeless tobacco  . Never Used     Counseling given: Not Answered   Past Medical History  Diagnosis Date  . History of chicken pox   . Depression   . Ulcer   . Family history of polyps in the colon   . UTI (urinary tract infection)   . Arthritis   . Hypertension   . Hyperlipidemia   . History of colonic polyps    Past Surgical History  Procedure Laterality Date  . Breast surgery Right 2001    bernign lumpectomy    Family History  Problem Relation Age of Onset  . Stroke Mother 557   cerebral hemorrhage  . Hypertension Mother   . Diabetes Mother   . Heart disease Father   . Heart attack Father 672 . Cancer Sister     pancreatic  . Cancer Sister 865   retroperitoneal Ca removed, doing fine   . Hyperlipidemia Son    History  Sexual Activity  . Sexual Activity: No    Outpatient Encounter Prescriptions as of 03/10/2016  Medication Sig  . amLODipine (NORVASC) 5 MG tablet TAKE 1 TABLET BY MOUTH DAILY.  .Marland Kitchenaspirin 81 MG tablet Take 81 mg by mouth daily.  . metoprolol succinate (TOPROL-XL) 50 MG 24 hr tablet TAKE 1 TABLET BY MOUTH DAILY TAKE WITH OR IMMEDIATELY FOLLOWING A MEAL.  . simvastatin (ZOCOR) 20 MG tablet TAKE 1 TABLET (20 MG TOTAL) BY MOUTH EVERY EVENING.  . traMADol (ULTRAM) 50 MG tablet Take 1 tablet (50 mg total) by mouth every 8 (eight) hours as needed.   No facility-administered encounter medications on file as of 03/10/2016.     Activities of Daily Living In your present state of health, do you have any difficulty performing the following activities: 03/10/2016  Hearing? N  Vision? N  Difficulty concentrating or making decisions? N  Walking or climbing stairs? Y  Dressing or bathing? N  Doing errands, shopping? N  Preparing Food and eating ? N  Using the Toilet? N  In the past six months, have you accidently leaked urine? N  Do you have problems with loss of bowel control? N  Managing your Medications? N  Managing your Finances? N  Housekeeping or managing your Housekeeping? N    Patient Care Team: TCrecencio Mc MD as PCP - General (Internal Medicine) TCrecencio Mc MD (Internal Medicine)    Assessment:   This is a routine wellness examination for Tiffany Benjamin. The goal of the wellness visit is to assist the patient how to close the gaps in care and create a preventative care plan for the patient.   Osteoporosis risk reviewed.  DEXA Scan scheduled.  Medications reviewed; taking without issues or barriers.  Safety issues reviewed; smoke and carbon monoxide detectors in the home. No firearms in the home. Wears seatbelts when driving or riding with others.  No violence in the home.  No identified risk were noted; The patient was oriented x 3; appropriate in dress and manner and no objective failures at ADL's or IADL's.   COLOguard kit received; she brought in with her today.  Educated and demonstrated how to use the kit and return in the mail.  Teach back understanding.  Patient Concerns:  Blood Pressure; patient states her home BP cuff is broken and did not bring today.  She is in the process of purchasing another one and will bring to next visit.  BP taken x2 today with slight decrease in numbers.     Exercise Activities and Dietary recommendations Current Exercise Habits: Home exercise routine;Structured exercise class (Attends [employer] when possible), Type of exercise:  walking;calisthenics, Time (Minutes): 30, Frequency (Times/Week): 3, Weekly Exercise (Minutes/Week): 90, Intensity: Mild  Goals    . Healthy Lifestyle     Stay hydrated and drink plenty of fluids. Low carb diet.  Lean meats and vegetables. Stay active and continue walking and attending [employer] for exercise.      Fall Risk Fall Risk  03/10/2016 01/08/2015 10/03/2013  Falls in the past year? No No No   Depression Screen PHQ 2/9 Scores 03/10/2016 01/08/2015 10/03/2013  PHQ - 2 Score 0 0 0     Cognitive Testing MMSE - Mini Mental State Exam 03/10/2016  Orientation to time 5  Orientation to Place 5  Registration 3  Attention/ Calculation 5  Recall 3  Language- name 2 objects 2  Language- repeat 1  Language- follow 3 step command 3  Language- read & follow direction 1  Write a sentence 1  Copy design 1  Total score 30    Immunization History  Administered Date(s) Administered  . Influenza, High Dose Seasonal PF 11/25/2015  . Influenza,inj,Quad PF,36+ Mos 07/11/2013  . Influenza-Unspecified 10/04/2012  . Pneumococcal Conjugate-13 02/19/2015  . Pneumococcal-Unspecified 10/05/2011  . Td 10/05/2007  . Zoster 07/01/2012   Screening Tests Health Maintenance  Topic Date Due  . DEXA SCAN  11/29/1999  . INFLUENZA VACCINE  05/04/2016  . TETANUS/TDAP  10/04/2017  . ZOSTAVAX  Completed  . PNA vac Low Risk Adult  Completed      Plan:   End of life planning; Advance aging; Advanced directives discussed. Copy of HCPOA/Living Will requested.   During the course of the visit the patient was educated and counseled about the following appropriate screening and preventive services:   Vaccines to include Pneumoccal, Influenza, Hepatitis B, Td, Zostavax, HCV  Electrocardiogram  Cardiovascular Disease  Colorectal cancer screening  Bone density screening  Diabetes screening  Glaucoma screening  Mammography/PAP  Nutrition counseling   Patient Instructions (the written plan)  was given to the patient.   Varney Biles, LPN  01/02/5829

## 2016-03-10 NOTE — Patient Instructions (Addendum)
  [name] , Thank you for taking time to come for your Medicare Wellness Visit. I appreciate your ongoing commitment to your health goals. Please review the following plan we discussed and let me know if I can assist you in the future.   Follow up with Dr. Darrick Huntsmanullo as needed.   DEXA SCAN (Bone Density) AUGUST 8 at 1:00, Delford Field([place]) [facility], [phone] to reschedule.   COLOGUARD in the mail.  This is a list of the screening recommended for you and due dates:  Health Maintenance  Topic Date Due  . DEXA scan (bone density measurement)  11/29/1999  . Flu Shot  05/04/2016  . Tetanus Vaccine  10/04/2017  . Shingles Vaccine  Completed  . Pneumonia vaccines  Completed    Bone Densitometry Bone densitometry is an imaging test that uses a special X-ray to measure the amount of calcium and other minerals in your bones (bone density). This test is also known as a bone mineral density test or dual-energy X-ray absorptiometry (DXA). The test can measure bone density at your hip and your spine. It is similar to having a regular X-ray. You may have this test to:  Diagnose a condition that causes weak or thin bones (osteoporosis).  Predict your risk of a broken bone (fracture).  Determine how well osteoporosis treatment is working. LET [facility] PROVIDER KNOW ABOUT:  Any allergies you have.  All medicines you are taking, including vitamins, herbs, eye drops, creams, and over-the-counter medicines.  Previous problems you or members of your family have had with the use of anesthetics.  Any blood disorders you have.  Previous surgeries you have had.  Medical conditions you have.  Possibility of pregnancy.  Any other medical test you had within the previous 14 days that used contrast material. RISKS AND COMPLICATIONS Generally, this is a safe procedure. However, problems can occur and may include the following:  This test exposes you to a very small amount  of radiation.  The risks of radiation exposure may be greater to unborn children. BEFORE THE PROCEDURE  Do not take any calcium supplements for 24 hours before having the test. You can otherwise eat and drink what you usually do.  Take off all metal jewelry, eyeglasses, dental appliances, and any other metal objects. PROCEDURE  You may lie on an exam table. There will be an X-ray generator below you and an imaging device above you.  Other devices, such as boxes or braces, may be used to position your body properly for the scan.  You will need to lie still while the machine slowly scans your body.  The images will show up on a computer monitor. AFTER THE PROCEDURE You may need more testing at a later time.   This information is not intended to replace advice given to you by your health care provider. Make sure you discuss any questions you have with your health care provider.   Document Released: 10/12/2004 Document Revised: 10/11/2014 Document Reviewed: 02/28/2014 Elsevier Interactive Patient Education [employer]2016 Elsevier Inc.

## 2016-03-11 NOTE — Progress Notes (Signed)
  I have reviewed the above information and agree with above.   Maicie Vanderloop, MD 

## 2016-04-07 ENCOUNTER — Ambulatory Visit: Payer: Commercial Managed Care - HMO

## 2016-04-28 ENCOUNTER — Other Ambulatory Visit: Payer: Self-pay | Admitting: Internal Medicine

## 2016-05-11 ENCOUNTER — Ambulatory Visit: Payer: Commercial Managed Care - HMO | Attending: Internal Medicine

## 2016-07-16 ENCOUNTER — Telehealth: Payer: Self-pay | Admitting: Internal Medicine

## 2016-07-16 MED ORDER — OMEGA 3 1000 MG PO CAPS: ORAL_CAPSULE | ORAL | 11 refills | Status: DC

## 2016-07-16 NOTE — Telephone Encounter (Signed)
Please advise is patient supposed to be on medication

## 2016-07-16 NOTE — Telephone Encounter (Signed)
[facility] called and was looking for a verbal  prescription for omega 3. [phone]  Pt id # [id number]

## 2016-07-16 NOTE — Telephone Encounter (Signed)
Good grief.  Since when do we have to give a verbal autho for an OTC supplement?  By the time Verbal authorization  Granted I could just write an rx,  Which is what I just did.

## 2016-07-26 ENCOUNTER — Telehealth: Payer: Self-pay | Admitting: *Deleted

## 2016-07-26 ENCOUNTER — Other Ambulatory Visit: Payer: Self-pay | Admitting: Internal Medicine

## 2016-07-26 NOTE — Telephone Encounter (Signed)
Form in quick sign

## 2016-07-26 NOTE — Telephone Encounter (Signed)
[facility] has requested to know if a prescription form was received for Omega 3  Contact [phone] [facility]

## 2016-07-27 ENCOUNTER — Other Ambulatory Visit: Payer: Self-pay | Admitting: *Deleted

## 2016-07-27 MED ORDER — OMEGA 3 1000 MG PO CAPS: ORAL_CAPSULE | ORAL | 3 refills | Status: DC

## 2016-07-27 NOTE — Telephone Encounter (Signed)
Script faxed.

## 2016-08-04 ENCOUNTER — Ambulatory Visit (INDEPENDENT_AMBULATORY_CARE_PROVIDER_SITE_OTHER): Payer: Commercial Managed Care - HMO

## 2016-08-04 DIAGNOSIS — Z23 Encounter for immunization: Secondary | ICD-10-CM

## 2016-09-02 ENCOUNTER — Ambulatory Visit: Payer: Self-pay | Admitting: Internal Medicine

## 2016-10-06 ENCOUNTER — Telehealth: Payer: Self-pay | Admitting: Internal Medicine

## 2016-10-06 NOTE — Telephone Encounter (Signed)
Pt son Earl LitesGregory called and stated that they are out of town and that pt has run out of medication. She needs metoprolol succinate (TOPROL-XL) 50 MG 24 hr tablet. Please advise, thank you!    [facility] [address], [place], [place], [id number] phone [phone]  Call IsabelGregory @ [phone]

## 2016-10-07 MED ORDER — METOPROLOL SUCCINATE ER 50 MG PO TB24: ORAL_TABLET | ORAL | 1 refills | Status: DC

## 2016-10-07 NOTE — Telephone Encounter (Signed)
Spoke with son, [facility] [place]. Pt must keep appt 03/2017

## 2016-12-06 ENCOUNTER — Encounter: Payer: Self-pay | Admitting: Internal Medicine

## 2016-12-06 ENCOUNTER — Ambulatory Visit (INDEPENDENT_AMBULATORY_CARE_PROVIDER_SITE_OTHER): Payer: Commercial Managed Care - HMO | Admitting: Internal Medicine

## 2016-12-06 VITALS — BP 200/98 | HR 68 | Temp 98.1°F | Wt 178.0 lb

## 2016-12-06 DIAGNOSIS — I10 Essential (primary) hypertension: Secondary | ICD-10-CM

## 2016-12-06 DIAGNOSIS — E7800 Pure hypercholesterolemia, unspecified: Secondary | ICD-10-CM | POA: Diagnosis not present

## 2016-12-06 DIAGNOSIS — Z79899 Other long term (current) drug therapy: Secondary | ICD-10-CM

## 2016-12-06 DIAGNOSIS — M170 Bilateral primary osteoarthritis of knee: Secondary | ICD-10-CM | POA: Diagnosis not present

## 2016-12-06 MED ORDER — AMLODIPINE BESYLATE 10 MG PO TABS: 10.0000 mg | ORAL_TABLET | Freq: Every day | ORAL | 1 refills | Status: DC

## 2016-12-06 MED ORDER — TRAMADOL HCL 50 MG PO TABS: 50.0000 mg | ORAL_TABLET | Freq: Three times a day (TID) | ORAL | 0 refills | Status: DC | PRN

## 2016-12-06 NOTE — Progress Notes (Signed)
Subjective:  Patient ID: Tiffany Benjamin, female    DOB: May 14, 1933  Age: 83 y.o. MRN: [id number]  CC: The primary encounter diagnosis was Pure hypercholesterolemia. Diagnoses of Essential hypertension, Tricompartment osteoarthritis of both knees, and Long-term use of high-risk medication were also pertinent to this visit.  HPI Tiffany Benjamin presents for low back and knee pain aggravated by walking.  Patient returned yesterday  from [place].  She is accompanied by her [place], [place]  She reports that her knees and lower back have been  "bothering her" in the mornings and after prolonged sitting/lying.  The pain is  Moderate,  And associated with stiffness .  She denies joint warmth redness and swelling  No radiculopathy symptoms.  Pain improves after ten minutes of activity   Uncontrolled hypertension. She reports that she has been taking her medications as prescribed, both  amlodipine and metoprolol XL.  And follows a low sodium diet.  She denies headache, vision changes, dyspnea and chest pain.  Has not been checking her blood pressure at home but does have an automatic bp monitor that she has not used in several years.    Review of Systems;  Patient denies headache, fevers, malaise, unintentional weight loss, skin rash, eye pain, sinus congestion and sinus pain, sore throat, dysphagia,  hemoptysis , cough,, wheezing,  palpitations, orthopnea, edema, abdominal pain, nausea, melena, diarrhea, constipation, flank pain, dysuria, hematuria, urinary  Frequency, nocturia, numbness, tingling, seizures,  Focal weakness, Loss of consciousness,  Tremor, insomnia, depression, anxiety, and suicidal ideation.      Objective:  BP (!) 200/98   Pulse 68   Temp 98.1 F (36.7 C) (Oral)   Wt 178 lb (80.7 kg)   SpO2 95%   BMI 33.09 kg/m   BP Readings from Last 3 Encounters:  12/06/16 (!) 200/98  03/10/16 (!) 148/62  03/02/16 (!)  164/92    Wt Readings from Last 3 Encounters:  12/06/16 178 lb (80.7 kg)  03/10/16 166 lb (75.3 kg)  03/02/16 166 lb 4 oz (75.4 kg)    General appearance: alert, cooperative and appears stated age Ears: normal TM's and external ear canals both ears Throat: lips, mucosa, and tongue normal; teeth and gums normal Neck: no adenopathy, no carotid bruit, supple, symmetrical, trachea midline and thyroid not enlarged, symmetric, no tenderness/mass/nodules Back: symmetric, no curvature. ROM normal. No CVA tenderness. Lungs: clear to auscultation bilaterally Heart: regular rate and rhythm, S1, S2 normal, no murmur, click, rub or gallop Abdomen: soft, non-tender; bowel sounds normal; no masses,  no organomegaly Pulses: 2+ and symmetric Skin: Skin color, texture, turgor normal. No rashes or lesions Lymph nodes: Cervical, supraclavicular, and axillary nodes normal.  No results found for: HGBA1C  Lab Results  Component Value Date   CREATININE 0.96 03/02/2016   CREATININE 1.20 11/25/2015   CREATININE 0.98 02/19/2015    Lab Results  Component Value Date   WBC 6.8 03/02/2016   HGB 12.7 03/02/2016   HCT 38.5 03/02/2016   PLT 274.0 03/02/2016   GLUCOSE 82 03/02/2016   CHOL 205 (H) 03/02/2016   TRIG 93.0 03/02/2016   HDL 87.80 03/02/2016   LDLDIRECT 88.0 03/02/2016   LDLCALC 99 03/02/2016   ALT 14 03/02/2016   AST 17 03/02/2016   NA 141 03/02/2016   K 4.2 03/02/2016   CL 104 03/02/2016   CREATININE 0.96 03/02/2016   BUN 15 03/02/2016   CO2 30 03/02/2016  TSH 0.72 03/02/2016    Assessment & Plan:   Problem List Items Addressed This Visit    Hyperlipidemia - Primary    Managed with simvastatin.  she has no side effects and liver enzymes and lipids are overdue . No changes today  Lab Results  Component Value Date   CHOL 205 (H) 03/02/2016   HDL 87.80 03/02/2016   LDLCALC 99 03/02/2016   LDLDIRECT 88.0 03/02/2016   TRIG 93.0 03/02/2016   CHOLHDL 2 03/02/2016   Lab Results   Component Value Date   ALT 14 03/02/2016   AST 17 03/02/2016   ALKPHOS 64 03/02/2016   BILITOT 0.5 03/02/2016   Lab Results  Component Value Date   LDLCALC 99 03/02/2016              Relevant Medications   amLODipine (NORVASC) 10 MG tablet   Other Relevant Orders   Lipid panel   LDL cholesterol, direct   Hypertension    Uncontrolled , for unclear reasons.  She denies recent use of NSAIDS and missed doses of medications.  Daughter is privately concerned about the development of cognitive decline,  But cannot confirm medication noncompliance since patient is independent.  Clonidine 0.1 mg given today in office,  And advised to take additional 5 mg amlodipine tonight. Will increase daily dose of amlodipine to 10 mg starting tomorrow and continue 50 mg Toprol XL , dose escalation is limited by pulse.  May need to add losartan/hct .  rtc 2 days for bp check/BMET/microurinalysis.       Relevant Medications   amLODipine (NORVASC) 10 MG tablet   Other Relevant Orders   Basic metabolic panel   Microalbumin / creatinine urine ratio   Tricompartment Severe Knee OA, Bilateral    She has pain with rest and inactivity.  Resuming tramadol for prn use tid.  Advised to continue avoiding NSAIDS given uncontrolled hypertension .  Will discussed surgery if pain is not controlled at follow up.       Relevant Medications   traMADol (ULTRAM) 50 MG tablet    Other Visit Diagnoses    Long-term use of high-risk medication       Relevant Orders   Hepatic function panel      I have changed [name]'s amLODipine. I am also having her maintain her aspirin, simvastatin, simvastatin, amLODipine, Omega 3, metoprolol succinate, and traMADol.  Meds ordered this encounter  Medications  . traMADol (ULTRAM) 50 MG tablet    Sig: Take 1 tablet (50 mg total) by mouth every 8 (eight) hours as needed.    Dispense:  90 tablet    Refill:  0  . amLODipine (NORVASC) 10 MG tablet    Sig: Take 1 tablet  (10 mg total) by mouth daily.    Dispense:  90 tablet    Refill:  1    Medications Discontinued During This Encounter  Medication Reason  . traMADol (ULTRAM) 50 MG tablet Reorder  . amLODipine (NORVASC) 5 MG tablet Reorder    Follow-up: No Follow-up on file.   Sherlene Shams, MD

## 2016-12-06 NOTE — Patient Instructions (Signed)
I have given you clonidine 0.1 mg tonight to gently lower your blood pressure tonight,  Take 5 mg of amlodipine before you go to bed tonight   In the morning, take 10 mg of amlodipine and 50  Mg Toprol   Return  On Wednesday after noon for a repeat BP check  [facility]    2) your knee pain is due to arthritis,  You can continue tylenol,  And I have refilled your tramadol ,  You can use it up to 3 daily  DO NOT TAKE ADVIL, MOTRIN OR ALEVE

## 2016-12-07 MED ORDER — CLONIDINE HCL 0.1 MG PO TABS: 0.1000 mg | ORAL_TABLET | Freq: Once | ORAL | Status: DC

## 2016-12-07 NOTE — Assessment & Plan Note (Signed)
She has pain with rest and inactivity.  Resuming tramadol for prn use tid.  Advised to continue avoiding NSAIDS given uncontrolled hypertension .  Will discussed surgery if pain is not controlled at follow up.

## 2016-12-07 NOTE — Assessment & Plan Note (Signed)
Managed with simvastatin.  she has no side effects and liver enzymes and lipids are overdue . No changes today  Lab Results  Component Value Date   CHOL 205 (H) 03/02/2016   HDL 87.80 03/02/2016   LDLCALC 99 03/02/2016   LDLDIRECT 88.0 03/02/2016   TRIG 93.0 03/02/2016   CHOLHDL 2 03/02/2016   Lab Results  Component Value Date   ALT 14 03/02/2016   AST 17 03/02/2016   ALKPHOS 64 03/02/2016   BILITOT 0.5 03/02/2016   Lab Results  Component Value Date   LDLCALC 99 03/02/2016

## 2016-12-07 NOTE — Assessment & Plan Note (Addendum)
Uncontrolled , for unclear reasons.  She denies recent use of NSAIDS and missed doses of medications.  Daughter is privately concerned about the development of cognitive decline,  But cannot confirm medication noncompliance since patient is independent.  Clonidine 0.1 mg given today in office,  And advised to take additional 5 mg amlodipine tonight. Will increase daily dose of amlodipine to 10 mg starting tomorrow and continue 50 mg Toprol XL , dose escalation is limited by pulse.  May need to add losartan/hct .  rtc 2 days for bp check/BMET/microurinalysis.

## 2016-12-07 NOTE — Addendum Note (Signed)
Addended by: Acey LavOMAN, Madlyn Crosby M on: 12/07/2016 09:11 AM   Modules accepted: Orders

## 2016-12-08 ENCOUNTER — Telehealth: Payer: Self-pay | Admitting: *Deleted

## 2016-12-08 ENCOUNTER — Other Ambulatory Visit: Payer: Self-pay

## 2016-12-08 NOTE — Telephone Encounter (Signed)
Please call patient in morning and find out why she no showed.

## 2016-12-08 NOTE — Progress Notes (Signed)
Patient scheduled for labs and nurse visit.

## 2016-12-08 NOTE — Telephone Encounter (Signed)
FYI patient no showed for lab and BP check.

## 2016-12-09 ENCOUNTER — Other Ambulatory Visit (INDEPENDENT_AMBULATORY_CARE_PROVIDER_SITE_OTHER): Payer: Commercial Managed Care - HMO

## 2016-12-09 ENCOUNTER — Ambulatory Visit (INDEPENDENT_AMBULATORY_CARE_PROVIDER_SITE_OTHER): Payer: Commercial Managed Care - HMO | Admitting: *Deleted

## 2016-12-09 VITALS — BP 180/84 | HR 58 | Resp 14

## 2016-12-09 DIAGNOSIS — I10 Essential (primary) hypertension: Secondary | ICD-10-CM

## 2016-12-09 DIAGNOSIS — E7800 Pure hypercholesterolemia, unspecified: Secondary | ICD-10-CM | POA: Diagnosis not present

## 2016-12-09 LAB — LIPID PANEL
CHOL/HDL RATIO: 2
CHOLESTEROL: 179 mg/dL (ref 0–200)
HDL: 77 mg/dL (ref >39.00)
LDL CALC: 86 mg/dL (ref 0–99)
NONHDL: 102.48
Triglycerides: 84 mg/dL (ref 0.0–149.0)
VLDL: 16.8 mg/dL (ref 0.0–40.0)

## 2016-12-09 LAB — LDL CHOLESTEROL, DIRECT: LDL DIRECT: 81 mg/dL

## 2016-12-09 LAB — BASIC METABOLIC PANEL
BUN: 21 mg/dL (ref 6–23)
CALCIUM: 9.4 mg/dL (ref 8.4–10.5)
CO2: 27 mEq/L (ref 19–32)
Chloride: 106 mEq/L (ref 96–112)
Creatinine, Ser: 1.01 mg/dL (ref 0.40–1.20)
GFR: 67.48 mL/min (ref >60.00)
Glucose, Bld: 104 mg/dL — ABNORMAL HIGH (ref 70–99)
POTASSIUM: 4.1 meq/L (ref 3.5–5.1)
SODIUM: 142 meq/L (ref 135–145)

## 2016-12-09 LAB — HEPATIC FUNCTION PANEL
ALBUMIN: 4.1 g/dL (ref 3.5–5.2)
ALT: 14 U/L (ref 0–35)
AST: 18 U/L (ref 0–37)
Alkaline Phosphatase: 66 U/L (ref 39–117)
Bilirubin, Direct: 0.1 mg/dL (ref 0.0–0.3)
Total Bilirubin: 0.5 mg/dL (ref 0.2–1.2)
Total Protein: 7.3 g/dL (ref 6.0–8.3)

## 2016-12-09 LAB — MICROALBUMIN / CREATININE URINE RATIO
Creatinine,U: 355.7 mg/dL
Microalb Creat Ratio: 7.8 mg/g (ref 0.0–30.0)
Microalb, Ur: 27.9 mg/dL — ABNORMAL HIGH (ref 0.0–1.9)

## 2016-12-09 NOTE — Progress Notes (Signed)
Patient presented for re-check on BP from visit on 12/06/16 BP at that visit was 200/98 with pulse 68. Patient today was allowed to rest for 10 minutes and BP was attained in left arm , feet flat on floor ,with arm at heart level supported by nurse BP 188/82 pulse 58. After 5 minutes the right arm was checked and BP 180/84 Pulse 58,  Patient stated when ask that she had taken her Amlodipine 10 mg and Toprol XL 50 mg this morning around 9 am.

## 2016-12-09 NOTE — Telephone Encounter (Signed)
FYI patient rescheduled today.

## 2016-12-10 ENCOUNTER — Other Ambulatory Visit: Payer: Self-pay | Admitting: Internal Medicine

## 2016-12-10 MED ORDER — LOSARTAN POTASSIUM 50 MG PO TABS: 50.0000 mg | ORAL_TABLET | Freq: Every day | ORAL | 0 refills | Status: DC

## 2016-12-10 NOTE — Progress Notes (Signed)
Patient notified and voiced understanding Lab and Nurse visit scheduled.

## 2016-12-10 NOTE — Progress Notes (Signed)
BP IS BETTER BUT STILL HIGH,  AND SHE IS MAXED OUT ON AMLODIPINE AT 10 MG DAILY  AND METOPROLOL AT 50 MG DAILY DUE TO SLOW PULSE.  Marland Kitchen.  LABS ARE NOTABLE FOR NORMAL KIDNEY FUNCTION AND MILD PROTEIN AMOUNTS IN URINE,.   IA AM THEREFORE ADDING LOSARTAN 50 MG DAILY TO CURRENT REGIEMN, RTC ONE WEEK FOR BP CHECK AND BMET

## 2016-12-15 ENCOUNTER — Telehealth: Payer: Self-pay | Admitting: *Deleted

## 2016-12-15 DIAGNOSIS — I10 Essential (primary) hypertension: Secondary | ICD-10-CM

## 2016-12-15 NOTE — Telephone Encounter (Signed)
Pt coming in on Friday for labs. Please place future lab orders.  Thanks 

## 2016-12-16 NOTE — Telephone Encounter (Signed)
BMET 

## 2016-12-17 ENCOUNTER — Other Ambulatory Visit (INDEPENDENT_AMBULATORY_CARE_PROVIDER_SITE_OTHER): Payer: Commercial Managed Care - HMO

## 2016-12-17 ENCOUNTER — Ambulatory Visit (INDEPENDENT_AMBULATORY_CARE_PROVIDER_SITE_OTHER): Payer: Commercial Managed Care - HMO | Admitting: *Deleted

## 2016-12-17 VITALS — BP 174/82 | HR 54 | Resp 12

## 2016-12-17 DIAGNOSIS — I10 Essential (primary) hypertension: Secondary | ICD-10-CM | POA: Diagnosis not present

## 2016-12-17 LAB — BASIC METABOLIC PANEL
BUN: 19 mg/dL (ref 6–23)
CO2: 28 mEq/L (ref 19–32)
CREATININE: 0.99 mg/dL (ref 0.40–1.20)
Calcium: 9.5 mg/dL (ref 8.4–10.5)
Chloride: 102 mEq/L (ref 96–112)
GFR: 69.05 mL/min (ref >60.00)
Glucose, Bld: 98 mg/dL (ref 70–99)
Potassium: 3.9 mEq/L (ref 3.5–5.1)
Sodium: 139 mEq/L (ref 135–145)

## 2016-12-17 NOTE — Progress Notes (Signed)
Patient presented for BP check , was allowed to rest for 5-8 minutes before BP was attained in left arm, BP 178/84 pulse 57, patient was allowed to rest additional 5 -8 minutes BP taken in right arm BP 174/82 pulse 54.

## 2016-12-19 NOTE — Progress Notes (Signed)
Vital signs and follow up basic metabolic panel have been  reviewed.  Her kidney function and potassium are normal and unchanged.  Her BP is better but nowhere near goal. She should increase her losartan to 100 mg daily using the 50 mg tablets that were prescribed recently and return in one week for bp check,  And continue taking her amlodipine and metoprolol at current doses.

## 2016-12-20 NOTE — Progress Notes (Signed)
Patient notified and voiced understanding.

## 2016-12-24 ENCOUNTER — Ambulatory Visit (INDEPENDENT_AMBULATORY_CARE_PROVIDER_SITE_OTHER): Payer: Commercial Managed Care - HMO | Admitting: *Deleted

## 2016-12-24 VITALS — BP 144/80 | HR 54 | Resp 12

## 2016-12-24 DIAGNOSIS — I10 Essential (primary) hypertension: Secondary | ICD-10-CM | POA: Diagnosis not present

## 2016-12-24 NOTE — Progress Notes (Signed)
Patient  Presented for BP check One week post increase in losartan 100 mg, patient BP was attained in left arm after patient had been allowed to rest for 8- 10 minutes. BP reading 140/82 pulse 51 then BP attained in right arm BP 144/80 pulse 54.

## 2016-12-28 ENCOUNTER — Telehealth: Payer: Self-pay | Admitting: Internal Medicine

## 2016-12-28 MED ORDER — SIMVASTATIN 20 MG PO TABS: ORAL_TABLET | ORAL | 2 refills | Status: DC

## 2016-12-28 MED ORDER — METOPROLOL SUCCINATE ER 50 MG PO TB24: ORAL_TABLET | ORAL | 1 refills | Status: DC

## 2016-12-28 NOTE — Progress Notes (Signed)
  I have reviewed the above information and agree with above. No changes today  Duncan Dulleresa Isbella Arline, MD

## 2016-12-28 NOTE — Telephone Encounter (Signed)
Pt lvm requesting refills of  Simvastatin 20 mg Metoprolol succinate 50 mg [facility] in chart Please cb pt once sent [phone]

## 2016-12-28 NOTE — Telephone Encounter (Signed)
Scripts sent patient notified. 

## 2017-03-04 ENCOUNTER — Ambulatory Visit (INDEPENDENT_AMBULATORY_CARE_PROVIDER_SITE_OTHER): Payer: Medicare HMO | Admitting: Internal Medicine

## 2017-03-04 ENCOUNTER — Encounter: Payer: Self-pay | Admitting: Internal Medicine

## 2017-03-04 VITALS — BP 178/92 | HR 52 | Temp 98.2°F | Resp 14 | Ht 61.5 in | Wt 173.6 lb

## 2017-03-04 DIAGNOSIS — I10 Essential (primary) hypertension: Secondary | ICD-10-CM | POA: Diagnosis not present

## 2017-03-04 DIAGNOSIS — Z23 Encounter for immunization: Secondary | ICD-10-CM

## 2017-03-04 DIAGNOSIS — E7800 Pure hypercholesterolemia, unspecified: Secondary | ICD-10-CM

## 2017-03-04 DIAGNOSIS — Z0000 Encounter for general adult medical examination without abnormal findings: Secondary | ICD-10-CM

## 2017-03-04 MED ORDER — LOSARTAN POTASSIUM 100 MG PO TABS: 100.0000 mg | ORAL_TABLET | Freq: Every day | ORAL | 1 refills | Status: DC

## 2017-03-04 NOTE — Patient Instructions (Signed)
Your blood am increasing your losartan dose to 100 mg daily .  Continue amlodipine and Metroprolol as well.  You are still eligible for an annual mammogram  .  Let me know if you want to resume breast cancer screening  You received the Pneumonia vaccine today.  You should return in September for fasting labs and  You should return to see me in December    Health Maintenance for Postmenopausal Women Menopause is a normal process in which your reproductive ability comes to an end. This process happens gradually over a span of months to years, usually between the ages of 83 and 60. Menopause is complete when you have missed 12 consecutive menstrual periods. It is important to talk with your health care provider about some of the most common conditions that affect postmenopausal women, such as heart disease, cancer, and bone loss (osteoporosis). Adopting a healthy lifestyle and getting preventive care can help to promote your health and wellness. Those actions can also lower your chances of developing some of these common conditions. What should I know about menopause? During menopause, you may experience a number of symptoms, such as:  Moderate-to-severe hot flashes.  Night sweats.  Decrease in sex drive.  Mood swings.  Headaches.  Tiredness.  Irritability.  Memory problems.  Insomnia.  Choosing to treat or not to treat menopausal changes is an individual decision that you make with your health care provider. What should I know about hormone replacement therapy and supplements? Hormone therapy products are effective for treating symptoms that are associated with menopause, such as hot flashes and night sweats. Hormone replacement carries certain risks, especially as you become older. If you are thinking about using estrogen or estrogen with progestin treatments, discuss the benefits and risks with your health care provider. What should I know about heart disease and stroke? Heart  disease, heart attack, and stroke become more likely as you age. This may be due, in part, to the hormonal changes that your body experiences during menopause. These can affect how your body processes dietary fats, triglycerides, and cholesterol. Heart attack and stroke are both medical emergencies. There are many things that you can do to help prevent heart disease and stroke:  Have your blood pressure checked at least every 1-2 years. High blood pressure causes heart disease and increases the risk of stroke.  If you are 73-90 years old, ask your health care provider if you should take aspirin to prevent a heart attack or a stroke.  Do not use any tobacco products, including cigarettes, chewing tobacco, or electronic cigarettes. If you need help quitting, ask your health care provider.  It is important to eat a healthy diet and maintain a healthy weight. ? Be sure to include plenty of vegetables, fruits, low-fat dairy products, and lean protein. ? Avoid eating foods that are high in solid fats, added sugars, or salt (sodium).  Get regular exercise. This is one of the most important things that you can do for your health. ? Try to exercise for at least 150 minutes each week. The type of exercise that you do should increase your heart rate and make you sweat. This is known as moderate-intensity exercise. ? Try to do strengthening exercises at least twice each week. Do these in addition to the moderate-intensity exercise.  Know your numbers.Ask your health care provider to check your cholesterol and your blood glucose. Continue to have your blood tested as directed by your health care provider.  What should I  know about cancer screening? There are several types of cancer. Take the following steps to reduce your risk and to catch any cancer development as early as possible. Breast Cancer  Practice breast self-awareness. ? This means understanding how your breasts normally appear and feel. ? It  also means doing regular breast self-exams. Let your health care provider know about any changes, no matter how small.  If you are 24 or older, have a clinician do a breast exam (clinical breast exam or CBE) every year. Depending on your age, family history, and medical history, it may be recommended that you also have a yearly breast X-ray (mammogram).  If you have a family history of breast cancer, talk with your health care provider about genetic screening.  If you are at high risk for breast cancer, talk with your health care provider about having an MRI and a mammogram every year.  Breast cancer (BRCA) gene test is recommended for women who have family members with BRCA-related cancers. Results of the assessment will determine the need for genetic counseling and BRCA1 and for BRCA2 testing. BRCA-related cancers include these types: ? Breast. This occurs in males or females. ? Ovarian. ? Tubal. This may also be called fallopian tube cancer. ? Cancer of the abdominal or pelvic lining (peritoneal cancer). ? Prostate. ? Pancreatic.  Cervical, Uterine, and Ovarian Cancer Your health care provider may recommend that you be screened regularly for cancer of the pelvic organs. These include your ovaries, uterus, and vagina. This screening involves a pelvic exam, which includes checking for microscopic changes to the surface of your cervix (Pap test).  For women ages 21-65, health care providers may recommend a pelvic exam and a Pap test every three years. For women ages 89-65, they may recommend the Pap test and pelvic exam, combined with testing for human papilloma virus (HPV), every five years. Some types of HPV increase your risk of cervical cancer. Testing for HPV may also be done on women of any age who have unclear Pap test results.  Other health care providers may not recommend any screening for nonpregnant women who are considered low risk for pelvic cancer and have no symptoms. Ask your  health care provider if a screening pelvic exam is right for you.  If you have had past treatment for cervical cancer or a condition that could lead to cancer, you need Pap tests and screening for cancer for at least 20 years after your treatment. If Pap tests have been discontinued for you, your risk factors (such as having a new sexual partner) need to be reassessed to determine if you should start having screenings again. Some women have medical problems that increase the chance of getting cervical cancer. In these cases, your health care provider may recommend that you have screening and Pap tests more often.  If you have a family history of uterine cancer or ovarian cancer, talk with your health care provider about genetic screening.  If you have vaginal bleeding after reaching menopause, tell your health care provider.  There are currently no reliable tests available to screen for ovarian cancer.  Lung Cancer Lung cancer screening is recommended for adults 20-19 years old who are at high risk for lung cancer because of a history of smoking. A yearly low-dose CT scan of the lungs is recommended if you:  Currently smoke.  Have a history of at least 30 pack-years of smoking and you currently smoke or have quit within the past 15 years. A  pack-year is smoking an average of one pack of cigarettes per day for one year.  Yearly screening should:  Continue until it has been 15 years since you quit.  Stop if you develop a health problem that would prevent you from having lung cancer treatment.  Colorectal Cancer  This type of cancer can be detected and can often be prevented.  Routine colorectal cancer screening usually begins at age 27 and continues through age 37.  If you have risk factors for colon cancer, your health care provider may recommend that you be screened at an earlier age.  If you have a family history of colorectal cancer, talk with your health care provider about genetic  screening.  Your health care provider may also recommend using home test kits to check for hidden blood in your stool.  A small camera at the end of a tube can be used to examine your colon directly (sigmoidoscopy or colonoscopy). This is done to check for the earliest forms of colorectal cancer.  Direct examination of the colon should be repeated every 5-10 years until age 52. However, if early forms of precancerous polyps or small growths are found or if you have a family history or genetic risk for colorectal cancer, you may need to be screened more often.  Skin Cancer  Check your skin from head to toe regularly.  Monitor any moles. Be sure to tell your health care provider: ? About any new moles or changes in moles, especially if there is a change in a mole's shape or color. ? If you have a mole that is larger than the size of a pencil eraser.  If any of your family members has a history of skin cancer, especially at a young age, talk with your health care provider about genetic screening.  Always use sunscreen. Apply sunscreen liberally and repeatedly throughout the day.  Whenever you are outside, protect yourself by wearing long sleeves, pants, a wide-brimmed hat, and sunglasses.  What should I know about osteoporosis? Osteoporosis is a condition in which bone destruction happens more quickly than new bone creation. After menopause, you may be at an increased risk for osteoporosis. To help prevent osteoporosis or the bone fractures that can happen because of osteoporosis, the following is recommended:  If you are 95-53 years old, get at least 1,000 mg of calcium and at least 600 mg of vitamin D per day.  If you are older than age 38 but younger than age 23, get at least 1,200 mg of calcium and at least 600 mg of vitamin D per day.  If you are older than age 85, get at least 1,200 mg of calcium and at least 800 mg of vitamin D per day.  Smoking and excessive alcohol intake  increase the risk of osteoporosis. Eat foods that are rich in calcium and vitamin D, and do weight-bearing exercises several times each week as directed by your health care provider. What should I know about how menopause affects my mental health? Depression may occur at any age, but it is more common as you become older. Common symptoms of depression include:  Low or sad mood.  Changes in sleep patterns.  Changes in appetite or eating patterns.  Feeling an overall lack of motivation or enjoyment of activities that you previously enjoyed.  Frequent crying spells.  Talk with your health care provider if you think that you are experiencing depression. What should I know about immunizations? It is important that you get  and maintain your immunizations. These include:  Tetanus, diphtheria, and pertussis (Tdap) booster vaccine.  Influenza every year before the flu season begins.  Pneumonia vaccine.  Shingles vaccine.  Your health care provider may also recommend other immunizations. This information is not intended to replace advice given to you by your health care provider. Make sure you discuss any questions you have with your health care provider. Document Released: 11/12/2005 Document Revised: 04/09/2016 Document Reviewed: 06/24/2015 Elsevier Interactive Patient Education  2018 [employer].

## 2017-03-04 NOTE — Progress Notes (Signed)
Patient ID: Richarda OverlieGracie M Krantz, female    DOB: 1932/12/31  Age: 83 y.o. MRN: [id number]  The patient is here for annual  Physical  examination and management of other chronic and acute problems.  Needs DEXA (ordered) Last mammogram 2015.  Discussed continued screening vs stopping .  Due for Pneumovax    The risk factors are reflected in the social history.  The roster of all physicians providing medical care to patient - is listed in the Snapshot section of the chart.  Activities of daily living:  The patient is 100% independent in all ADLs: dressing, toileting, feeding as well as independent mobility  Home safety : The patient has smoke detectors in the home. They wear seatbelts.  There are no firearms at home. There is no violence in the home.   There is no risks for hepatitis, STDs or HIV. There is no   history of blood transfusion. They have no travel history to infectious disease endemic areas of the world.  The patient has seen their dentist in the last six month. They have seen their eye doctor in the last year. They admit to slight hearing difficulty with regard to whispered voices and some television programs.  They have deferred audiologic testing in the last year.  They do not  have excessive sun exposure. Discussed the need for sun protection: hats, long sleeves and use of sunscreen if there is significant sun exposure.   Diet: the importance of a healthy diet is discussed. They do have a healthy diet.  The benefits of regular aerobic exercise were discussed. She walks 4 times per week ,  20 minutes.   Depression screen: there are no signs or vegative symptoms of depression- irritability, change in appetite, anhedonia, sadness/tearfullness.  Cognitive assessment: the patient manages all their financial and personal affairs and is actively engaged. They could relate day,date,year and events; recalled 2/3 objects at 3 minutes; performed clock-face test normally.  The following  portions of the patient's history were reviewed and updated as appropriate: allergies, current medications, past family history, past medical history,  past surgical history, past social history  and problem list.  Visual acuity was not assessed per patient preference since she has regular follow up with her ophthalmologist. Hearing and body mass index were assessed and reviewed.   During the course of the visit the patient was educated and counseled about appropriate screening and preventive services including : fall prevention , diabetes screening, nutrition counseling, colorectal cancer screening, and recommended immunizations.    CC: The primary encounter diagnosis was Need for 23-polyvalent pneumococcal polysaccharide vaccine. Diagnoses of Encounter for preventive health examination, Essential hypertension, and Pure hypercholesterolemia were also pertinent to this visit.   1) hypertension:  prescribed 3 medications .toprol XL 50,  Amlodipine 10 mg  And  Losartan increased to 100 mg in March but  Patient has been taking only 50 mg  Since then. Tolerating medications without side effects.   History [name] has a past medical history of Arthritis; Depression; Family history of polyps in the colon; History of chicken pox; History of colonic polyps; Hyperlipidemia; Hypertension; Ulcer; and UTI (urinary tract infection).   She has a past surgical history that includes Breast surgery (Right, 2001).   Her family history includes Cancer in her sister; Cancer (age of onset: 8281) in her sister; Diabetes in her mother; Heart attack (age of onset: 4862) in her father; Heart disease in her father; Hyperlipidemia in her son; Hypertension in her mother; Stroke (age  of onset: 69) in her mother.She reports that she has never smoked. She has never used smokeless tobacco. She reports that she drinks about 1.8 oz of alcohol per week . She reports that she does not use drugs.  Outpatient Medications Prior to Visit   Medication Sig Dispense Refill  . amLODipine (NORVASC) 10 MG tablet Take 1 tablet (10 mg total) by mouth daily. 90 tablet 1  . aspirin 81 MG tablet Take 81 mg by mouth daily.    . metoprolol succinate (TOPROL-XL) 50 MG 24 hr tablet TAKE 1 TABLET BY MOUTH DAILY TAKE WITH OR IMMEDIATELY FOLLOWING A MEAL. 90 tablet 1  . Omega 3 1000 MG CAPS 1 capsule twice daily with meals 360 each 3  . simvastatin (ZOCOR) 20 MG tablet TAKE 1 TABLET (20 MG TOTAL) BY MOUTH EVERY EVENING. 90 tablet 2  . traMADol (ULTRAM) 50 MG tablet Take 1 tablet (50 mg total) by mouth every 8 (eight) hours as needed. 90 tablet 0  . losartan (COZAAR) 50 MG tablet Take 1 tablet (50 mg total) by mouth daily. 90 tablet 0   Facility-Administered Medications Prior to Visit  Medication Dose Route Frequency Provider Last Rate Last Dose  . cloNIDine (CATAPRES) tablet 0.1 mg  0.1 mg Oral Once Sherlene Shams, MD        Review of Systems   Patient denies headache, fevers, malaise, unintentional weight loss, skin rash, eye pain, sinus congestion and sinus pain, sore throat, dysphagia,  hemoptysis , cough, dyspnea, wheezing, chest pain, palpitations, orthopnea, edema, abdominal pain, nausea, melena, diarrhea, constipation, flank pain, dysuria, hematuria, urinary  Frequency, nocturia, numbness, tingling, seizures,  Focal weakness, Loss of consciousness,  Tremor, insomnia, depression, anxiety, and suicidal ideation.      Objective:  BP (!) 178/92 (BP Location: Left Arm, Patient Position: Sitting, Cuff Size: Normal)   Pulse (!) 52   Temp 98.2 F (36.8 C) (Oral)   Resp 14   Ht 5' 1.5" (1.562 m)   Wt 173 lb 9.6 oz (78.7 kg)   SpO2 98%   BMI 32.27 kg/m   Physical Exam   General appearance: alert, cooperative and appears stated age Head: Normocephalic, without obvious abnormality, atraumatic Eyes: conjunctivae/corneas clear. PERRL, EOM's intact. Fundi benign. Ears: normal TM's and external ear canals both ears Nose: Nares normal.  Septum midline. Mucosa normal. No drainage or sinus tenderness. Throat: lips, mucosa, and tongue normal; teeth and gums normal Neck: no adenopathy, no carotid bruit, no JVD, supple, symmetrical, trachea midline and thyroid not enlarged, symmetric, no tenderness/mass/nodules Lungs: clear to auscultation bilaterally Breasts: normal appearance, no masses or tenderness Heart: regular rate and rhythm, S1, S2 normal, no murmur, click, rub or gallop Abdomen: soft, non-tender; bowel sounds normal; no masses,  no organomegaly Extremities: extremities normal, atraumatic, no cyanosis or edema Pulses: 2+ and symmetric Skin: Skin color, texture, turgor normal. No rashes or lesions Neurologic: Alert and oriented X 3, normal strength and tone. Normal symmetric reflexes. Normal coordination and gait.      Assessment & Plan:   Problem List Items Addressed This Visit    Hypertension    Elevated today.  Reviewed list of meds, patient is not taking 100 mg losartan as directed in march.  Will refill medication at higher dose       Relevant Medications   losartan (COZAAR) 100 MG tablet   Other Relevant Orders   Comprehensive metabolic panel   Hyperlipidemia   Relevant Medications   losartan (COZAAR) 100 MG tablet  Other Relevant Orders   Lipid panel   Encounter for preventive health examination    .Annual comprehensive preventive exam was done as well as an evaluation and management of chronic conditions .  During the course of the visit the patient was educated and counseled about appropriate screening and preventive services including :  diabetes screening, lipid analysis with projected  10 year  risk for CAD , nutrition counseling, breast, cervical and colorectal cancer screening, and recommended immunizations.  Printed recommendations for health maintenance screenings was given.  She is undecided about resuming mammograms.        Other Visit Diagnoses    Need for 23-polyvalent pneumococcal  polysaccharide vaccine    -  Primary   Relevant Orders   Pneumococcal polysaccharide vaccine 23-valent greater than or equal to 2yo subcutaneous/IM (Completed)      I have changed Ms. Koerber's losartan. I am also having her maintain her aspirin, Omega 3, traMADol, amLODipine, simvastatin, and metoprolol succinate. We will continue to administer cloNIDine.  Meds ordered this encounter  Medications  . losartan (COZAAR) 100 MG tablet    Sig: Take 1 tablet (100 mg total) by mouth daily.    Dispense:  90 tablet    Refill:  1    Medications Discontinued During This Encounter  Medication Reason  . losartan (COZAAR) 50 MG tablet Reorder    Follow-up: No Follow-up on file.   Sherlene Shams, MD

## 2017-03-06 NOTE — Assessment & Plan Note (Addendum)
.  Annual comprehensive preventive exam was done as well as an evaluation and management of chronic conditions .  During the course of the visit the patient was educated and counseled about appropriate screening and preventive services including :  diabetes screening, lipid analysis with projected  10 year  risk for CAD , nutrition counseling, breast, cervical and colorectal cancer screening, and recommended immunizations.  Printed recommendations for health maintenance screenings was given.  She is undecided about resuming mammograms.

## 2017-03-06 NOTE — Assessment & Plan Note (Signed)
Elevated today.  Reviewed list of meds, patient is not taking 100 mg losartan as directed in march.  Will refill medication at higher dose

## 2017-03-10 ENCOUNTER — Ambulatory Visit: Payer: Self-pay

## 2017-03-11 ENCOUNTER — Ambulatory Visit: Payer: Self-pay

## 2017-03-14 ENCOUNTER — Ambulatory Visit: Payer: Self-pay

## 2017-03-31 ENCOUNTER — Ambulatory Visit (INDEPENDENT_AMBULATORY_CARE_PROVIDER_SITE_OTHER): Payer: Medicare HMO

## 2017-03-31 VITALS — BP 140/78 | HR 60 | Temp 98.4°F | Resp 14 | Ht 62.0 in | Wt 172.0 lb

## 2017-03-31 DIAGNOSIS — Z0000 Encounter for general adult medical examination without abnormal findings: Secondary | ICD-10-CM | POA: Diagnosis not present

## 2017-03-31 DIAGNOSIS — E2839 Other primary ovarian failure: Secondary | ICD-10-CM

## 2017-03-31 NOTE — Progress Notes (Signed)
Subjective:   Richarda OverlieGracie M Evangelist is a 83 y.o. female who presents for Medicare Annual (Subsequent) preventive examination.  Review of Systems:  No ROS.  Medicare Wellness Visit. Additional risk factors are reflected in the social history.  Cardiac Risk Factors include: advanced age (>6555men, 66>65 women);hypertension;obesity (BMI >30kg/m2)     Objective:     Vitals: BP 140/78 (BP Location: Left Arm, Patient Position: Sitting, Cuff Size: Normal)   Pulse 60   Temp 98.4 F (36.9 C) (Oral)   Resp 14   Ht 5\' 2"  (1.575 m)   Wt 172 lb (78 kg)   SpO2 95%   BMI 31.46 kg/m   Body mass index is 31.46 kg/m.   Tobacco History  Smoking Status  . Never Smoker  Smokeless Tobacco  . Never Used     Counseling given: Not Answered   Past Medical History:  Diagnosis Date  . Arthritis   . Depression   . Family history of polyps in the colon   . History of chicken pox   . History of colonic polyps   . Hyperlipidemia   . Hypertension   . Ulcer   . UTI (urinary tract infection)    Past Surgical History:  Procedure Laterality Date  . BREAST SURGERY Right 2001   bernign lumpectomy    Family History  Problem Relation Age of Onset  . Stroke Mother [id number]       cerebral hemorrhage  . Hypertension Mother   . Diabetes Mother   . Heart disease Father   . Heart attack Father [id number]  . Cancer Sister        pancreatic  . Cancer Sister [id number]       retroperitoneal Ca removed, doing fine   . Hyperlipidemia Son    History  Sexual Activity  . Sexual activity: No    Outpatient Encounter Prescriptions as of 03/31/2017  Medication Sig  . amLODipine (NORVASC) 10 MG tablet Take 1 tablet (10 mg total) by mouth daily.  . Ascorbic Acid (VITAMIN C PO) Take 1 tablet by mouth daily.  Marland Kitchen. aspirin 81 MG tablet Take 81 mg by mouth daily.  Marland Kitchen. losartan (COZAAR) 100 MG tablet Take 1 tablet (100 mg total) by mouth daily.  . metoprolol succinate (TOPROL-XL) 50 MG 24 hr tablet TAKE 1 TABLET BY MOUTH DAILY TAKE WITH  OR IMMEDIATELY FOLLOWING A MEAL.  Marland Kitchen. Omega 3 1000 MG CAPS 1 capsule twice daily with meals  . simvastatin (ZOCOR) 20 MG tablet TAKE 1 TABLET (20 MG TOTAL) BY MOUTH EVERY EVENING.  . traMADol (ULTRAM) 50 MG tablet Take 1 tablet (50 mg total) by mouth every 8 (eight) hours as needed.  Marland Kitchen. VITAMIN D, CHOLECALCIFEROL, PO Take 5,000 mg by mouth daily.  Marland Kitchen. VITAMIN E PO Take 1 capsule by mouth daily.   Facility-Administered Encounter Medications as of 03/31/2017  Medication  . cloNIDine (CATAPRES) tablet 0.1 mg    Activities of Daily Living In your present state of health, do you have any difficulty performing the following activities: 03/31/2017  Hearing? N  Vision? N  Difficulty concentrating or making decisions? N  Walking or climbing stairs? N  Dressing or bathing? N  Doing errands, shopping? N  Preparing Food and eating ? N  Using the Toilet? N  In the past six months, have you accidently leaked urine? N  Do you have problems with loss of bowel control? N  Managing your Medications? N  Managing your Finances? N  Housekeeping or  managing your Housekeeping? N  Some recent data might be hidden    Patient Care Team: Sherlene Shams, MD as PCP - General (Internal Medicine) Sherlene Shams, MD (Internal Medicine)    Assessment:    This is a routine wellness examination for Teniyah. The goal of the wellness visit is to assist the patient how to close the gaps in care and create a preventative care plan for the patient.   The roster of all physicians providing medical care to patient is listed in the Snapshot section of the chart.  Taking calcium VIT D as appropriate/Osteoporosis risk reviewed.    Safety issues reviewed; grand daughter and her husband live with her.  Smoke and carbon monoxide detectors in the home. No firearms in the home.   Wears seatbelts when driving or riding with others. Patient does wear sunscreen or protective clothing when in direct sunlight. No violence in the  home.  Patient is alert, normal appearance, oriented to person/place/and time.  Correctly identified the president of the [place], recall of a 5 word phrase, and performing simple calculations. Displays appropriate judgement and can read correct time from watch face.   No new identified risk were noted.  No failures at ADL's or IADL's.   BMI- discussed the importance of a healthy diet, water intake and the benefits of aerobic exercise. Educational material provided.   Daily fluid intake: cups of caffeine (1 per week),  4 cups of water, 2 cups green tea.  Dental- every 12 months.  Dental Works.  Eye- Visual acuity not assessed per patient preference since they have regular follow up with the ophthalmologist.  Wears corrective lenses.  Sleep patterns- Sleeps 6-8 hours at night.  Wakes feeling rested.  Dexa sacn ordered; follow as directed.  Educational material provided.  Patient Concerns: Left knee pain.  0-10 scale at 7.  Currently taking Tylenol.  Hx of injections to the knee, last injection she reports 2 years ago.  Deferred to PCP for follow up.  Exercise Activities and Dietary recommendations Current Exercise Habits: Structured exercise class, Type of exercise: walking;treadmill;strength training/weights, Time (Minutes): 60, Frequency (Times/Week): 3, Weekly Exercise (Minutes/Week): 180  Goals    . Increase physical activity          Stay active. Water aerobic exercise at the [employer] [facility] cyt[facility] [employer]).      Fall Risk Fall Risk  03/31/2017 03/10/2016 01/08/2015 10/03/2013  Falls in the past year? No No No No   Depression Screen PHQ 2/9 Scores 03/31/2017 03/10/2016 01/08/2015 10/03/2013  PHQ - 2 Score 0 0 0 0  PHQ- 9 Score 0 - - -     Cognitive Function MMSE - Mini Mental State Exam 03/10/2016  Orientation to time 5  Orientation to Place 5  Registration 3  Attention/ Calculation 5  Recall 3  Language- name 2 objects 2  Language- repeat 1  Language- follow 3 step command 3    Language- read & follow direction 1  Write a sentence 1  Copy design 1  Total score 30     6CIT Screen 03/31/2017  What Year? 0 points  What month? 0 points  What time? 0 points  Count back from 20 0 points  Months in reverse 0 points  Repeat phrase 0 points  Total Score 0    Immunization History  Administered Date(s) Administered  . Influenza, High Dose Seasonal PF 11/25/2015  . Influenza,inj,Quad PF,36+ Mos 07/11/2013, 08/04/2016  . Influenza-Unspecified 10/04/2012  . Pneumococcal Conjugate-13  02/19/2015  . Pneumococcal Polysaccharide-23 03/04/2017  . Pneumococcal-Unspecified 10/05/2011  . Td 10/05/2007  . Zoster 07/01/2012   Screening Tests Health Maintenance  Topic Date Due  . DEXA SCAN  11/29/1999  . INFLUENZA VACCINE  05/04/2017  . TETANUS/TDAP  10/04/2017  . PNA vac Low Risk Adult  Completed      Plan:    End of life planning; Advance aging; Advanced directives discussed. Copy of current HCPOA/Living Will requested.    I have personally reviewed and noted the following in the patient's chart:   . Medical and social history . Use of alcohol, tobacco or illicit drugs  . Current medications and supplements . Functional ability and status . Nutritional status . Physical activity . Advanced directives . List of other physicians . Hospitalizations, surgeries, and ER visits in previous 12 months . Vitals . Screenings to include cognitive, depression, and falls . Referrals and appointments  In addition, I have reviewed and discussed with patient certain preventive protocols, quality metrics, and best practice recommendations. A written personalized care plan for preventive services as well as general preventive health recommendations were provided to patient.     OBrien-Blaney, Tracer Gutridge L, LPN  06/17/7828    I have reviewed the above information and agree with above.   Duncan Dull, MD

## 2017-03-31 NOTE — Patient Instructions (Addendum)
  Ms. [name] , Thank you for taking time to come for your Medicare Wellness Visit. I appreciate your ongoing commitment to your health goals. Please review the following plan we discussed and let me know if I can assist you in the future.   Follow up with Dr. Darrick Huntsmanullo as needed.    Bring a copy of your Health Care Power of Attorney and/or Living Will to be scanned into chart.  Dexa Scan ordered, follow as directed.  Have a great day!  These are the goals we discussed: Goals    . Increase physical activity          Stay active. Water aerobic exercise at the [employer] [facility]       This is a list of the screening recommended for you and due dates:  Health Maintenance  Topic Date Due  . DEXA scan (bone density measurement)  11/29/1999  . Flu Shot  05/04/2017  . Tetanus Vaccine  10/04/2017  . Pneumonia vaccines  Completed

## 2017-04-04 ENCOUNTER — Ambulatory Visit (INDEPENDENT_AMBULATORY_CARE_PROVIDER_SITE_OTHER): Payer: Medicare HMO | Admitting: Internal Medicine

## 2017-04-04 ENCOUNTER — Encounter: Payer: Self-pay | Admitting: Internal Medicine

## 2017-04-04 DIAGNOSIS — I10 Essential (primary) hypertension: Secondary | ICD-10-CM | POA: Diagnosis not present

## 2017-04-04 DIAGNOSIS — M1712 Unilateral primary osteoarthritis, left knee: Secondary | ICD-10-CM

## 2017-04-04 NOTE — Progress Notes (Signed)
Subjective:  Patient ID: Tiffany OverlieGracie M Benjamin, female    DOB: Feb 28, 1933  Age: 83 y.o. MRN: [id number]  CC: Diagnoses of Essential hypertension and Tricompartment osteoarthritis of left knee were pertinent to this visit.  HPI Tiffany OverlieGracie M Rohde presents for follow up on knee pain and hypertension.  She was last seen June 1 for annual CPE and losartan dose was increased to 100 mg .  She Did not take her medications today, but states that she rarely forgets to take them because she is using  A pill box.  She states that her bp last week  Was normal 140/78 , which has been confirmed.   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 Has been having chronic left knee pain ,  One previous steroid injection several years ago,  Requesting an injection as she does not want to have knee brace or surgery.   Outpatient Medications Prior to Visit  Medication Sig Dispense Refill  . amLODipine (NORVASC) 10 MG tablet Take 1 tablet (10 mg total) by mouth daily. 90 tablet 1  . Ascorbic Acid (VITAMIN C PO) Take 1 tablet by mouth daily.    Marland Kitchen. aspirin 81 MG tablet Take 81 mg by mouth daily.    Marland Kitchen. losartan (COZAAR) 100 MG tablet Take 1 tablet (100 mg total) by mouth daily. 90 tablet 1  . metoprolol succinate (TOPROL-XL) 50 MG 24 hr tablet TAKE 1 TABLET BY MOUTH DAILY TAKE WITH OR IMMEDIATELY FOLLOWING A MEAL. 90 tablet 1  . Omega 3 1000 MG CAPS 1 capsule twice daily with meals 360 each 3  . simvastatin (ZOCOR) 20 MG tablet TAKE 1 TABLET (20 MG TOTAL) BY MOUTH EVERY EVENING. 90 tablet 2  . traMADol (ULTRAM) 50 MG tablet Take 1 tablet (50 mg total) by mouth every 8 (eight) hours as needed. 90 tablet 0  . VITAMIN D, CHOLECALCIFEROL, PO Take 5,000 mg by mouth daily.    Marland Kitchen. VITAMIN E PO Take 1 capsule by mouth daily.     Facility-Administered Medications Prior to Visit  Medication Dose Route Frequency Provider Last Rate Last Dose  . cloNIDine (CATAPRES) tablet 0.1 mg  0.1 mg Oral Once Sherlene Shamsullo, Teresa L, MD         Review of Systems;  Patient denies headache, fevers, malaise, unintentional weight loss, skin rash, eye pain, sinus congestion and sinus pain, sore throat, dysphagia,  hemoptysis , cough, dyspnea, wheezing, chest pain, palpitations, orthopnea, edema, abdominal pain, nausea, melena, diarrhea, constipation, flank pain, dysuria, hematuria, urinary  Frequency, nocturia, numbness, tingling, seizures,  Focal weakness, Loss of consciousness,  Tremor, insomnia, depression, anxiety, and suicidal ideation.      Objective:  BP (!) 178/76 (BP Location: Left Arm, Cuff Size: Normal)   Pulse (!) 59   Temp 98.4 F (36.9 C) (Oral)   Resp 16   Ht 5' 1.5" (1.562 m)   Wt 171 lb 12.8 oz (77.9 kg)   SpO2 98%   BMI 31.94 kg/m   BP Readings from Last 3 Encounters:  04/04/17 (!) 178/76  03/31/17 140/78  03/04/17 (!) 178/92    Wt Readings from Last 3 Encounters:  04/04/17 171 lb 12.8 oz (77.9 kg)  03/31/17 172 lb (78 kg)  03/04/17 173 lb 9.6 oz (78.7 kg)    General appearance: alert, cooperative and appears stated age Neck: no adenopathy, no carotid bruit, supple, symmetrical, trachea midline and thyroid not enlarged, symmetric, no tenderness/mass/nodules Back: symmetric, no curvature. ROM normal. No CVA tenderness. Lungs:  clear to auscultation bilaterally Heart: regular rate and rhythm, S1, S2 normal, no murmur, click, rub or gallop Abdomen: soft, non-tender; bowel sounds normal; no masses,  no organomegaly Pulses: 2+ and symmetric Skin: Skin color, texture, turgor normal. No rashes or lesions MSK: left knee without erythema, warmth and effusion Lymph nodes: Cervical, supraclavicular, and axillary nodes normal.  No results found for: HGBA1C  Lab Results  Component Value Date   CREATININE 0.99 12/17/2016   CREATININE 1.01 12/09/2016   CREATININE 0.96 03/02/2016    Lab Results  Component Value Date   WBC 6.8 03/02/2016   HGB 12.7 03/02/2016   HCT 38.5 03/02/2016   PLT 274.0  03/02/2016   GLUCOSE 98 12/17/2016   CHOL 179 12/09/2016   TRIG 84.0 12/09/2016   HDL 77.00 12/09/2016   LDLDIRECT 81.0 12/09/2016   LDLCALC 86 12/09/2016   ALT 14 12/09/2016   AST 18 12/09/2016   NA 139 12/17/2016   K 3.9 12/17/2016   CL 102 12/17/2016   CREATININE 0.99 12/17/2016   BUN 19 12/17/2016   CO2 28 12/17/2016   TSH 0.72 03/02/2016   MICROALBUR 27.9 (H) 12/09/2016      Assessment & Plan:   Problem List Items Addressed This Visit    Hypertension    Elevated today because she  has not had her medications .   NO changes today since her BP was checked last week and at goal.   Lab Results  Component Value Date   CREATININE 0.99 12/17/2016   Lab Results  Component Value Date   NA 139 12/17/2016   K 3.9 12/17/2016   CL 102 12/17/2016   CO2 28 12/17/2016         Tricompartment Severe Knee OA, Bilateral    She is requesting a steroid injection in the left knee for relief of pain . Informed consent for joint injection obtained.  Area was cleaned with betadine. Left knee was  injected with Kenalog and Xylocaine  From the medial side of the patella. .  Procedure was tolerated well and shoulder was feeling less painful by the time she left the office.  She was advised to rest the knee for 48 hours , apply ice packs every 6 hours for 15 minutes, advised to call if she develops signs of infection           I am having Ms. Dinse maintain her aspirin, Omega 3, traMADol, amLODipine, simvastatin, metoprolol succinate, losartan, (VITAMIN D, CHOLECALCIFEROL, PO), Ascorbic Acid (VITAMIN C PO), and VITAMIN E PO. We will continue to administer cloNIDine.  No orders of the defined types were placed in this encounter.   There are no discontinued medications.  Follow-up: No Follow-up on file.   Sherlene Shams, MD

## 2017-04-06 NOTE — Assessment & Plan Note (Addendum)
She is requesting a steroid injection in the left knee for relief of pain . Informed consent for joint injection obtained.  Area was cleaned with betadine. Left knee was  injected with 40 mg Kenalog and 4 ml of Xylocaine  From the medial side of the patella. .  Procedure was tolerated well and shoulder was feeling less painful by the time she left the office.  She was advised to rest the knee for 48 hours , apply ice packs every 6 hours for 15 minutes, advised to call if she develops signs of infection

## 2017-04-06 NOTE — Assessment & Plan Note (Signed)
Elevated today because she  has not had her medications .   NO changes today since her BP was checked last week and at goal.   Lab Results  Component Value Date   CREATININE 0.99 12/17/2016   Lab Results  Component Value Date   NA 139 12/17/2016   K 3.9 12/17/2016   CL 102 12/17/2016   CO2 28 12/17/2016

## 2017-04-25 ENCOUNTER — Other Ambulatory Visit: Payer: Self-pay | Admitting: Internal Medicine

## 2017-05-19 ENCOUNTER — Other Ambulatory Visit: Payer: Self-pay

## 2017-06-15 ENCOUNTER — Other Ambulatory Visit (INDEPENDENT_AMBULATORY_CARE_PROVIDER_SITE_OTHER): Payer: Medicare HMO

## 2017-06-15 DIAGNOSIS — I10 Essential (primary) hypertension: Secondary | ICD-10-CM | POA: Diagnosis not present

## 2017-06-15 DIAGNOSIS — E7800 Pure hypercholesterolemia, unspecified: Secondary | ICD-10-CM | POA: Diagnosis not present

## 2017-06-15 LAB — COMPREHENSIVE METABOLIC PANEL
ALBUMIN: 4.1 g/dL (ref 3.5–5.2)
ALT: 10 U/L (ref 0–35)
AST: 16 U/L (ref 0–37)
Alkaline Phosphatase: 77 U/L (ref 39–117)
BILIRUBIN TOTAL: 0.7 mg/dL (ref 0.2–1.2)
BUN: 17 mg/dL (ref 6–23)
CHLORIDE: 102 meq/L (ref 96–112)
CO2: 28 meq/L (ref 19–32)
CREATININE: 1.08 mg/dL (ref 0.40–1.20)
Calcium: 9.5 mg/dL (ref 8.4–10.5)
GFR: 62.38 mL/min (ref >60.00)
GLUCOSE: 111 mg/dL — AB (ref 70–99)
POTASSIUM: 4.2 meq/L (ref 3.5–5.1)
SODIUM: 140 meq/L (ref 135–145)
TOTAL PROTEIN: 7.9 g/dL (ref 6.0–8.3)

## 2017-06-15 LAB — LIPID PANEL
Cholesterol: 178 mg/dL (ref 0–200)
HDL: 112.6 mg/dL (ref >39.00)
LDL Cholesterol: 47 mg/dL (ref 0–99)
NonHDL: 64.96
TRIGLYCERIDES: 90 mg/dL (ref 0.0–149.0)
Total CHOL/HDL Ratio: 2
VLDL: 18 mg/dL (ref 0.0–40.0)

## 2017-06-17 ENCOUNTER — Encounter: Payer: Self-pay | Admitting: Internal Medicine

## 2017-06-17 ENCOUNTER — Ambulatory Visit (INDEPENDENT_AMBULATORY_CARE_PROVIDER_SITE_OTHER): Payer: Medicare HMO | Admitting: Internal Medicine

## 2017-06-17 VITALS — BP 160/74 | HR 59 | Temp 98.6°F | Resp 15 | Ht 61.5 in | Wt 169.4 lb

## 2017-06-17 DIAGNOSIS — R7301 Impaired fasting glucose: Secondary | ICD-10-CM

## 2017-06-17 DIAGNOSIS — J340 Abscess, furuncle and carbuncle of nose: Secondary | ICD-10-CM | POA: Diagnosis not present

## 2017-06-17 DIAGNOSIS — I10 Essential (primary) hypertension: Secondary | ICD-10-CM | POA: Diagnosis not present

## 2017-06-17 LAB — POCT GLYCOSYLATED HEMOGLOBIN (HGB A1C): Hemoglobin A1C: 5.3

## 2017-06-17 MED ORDER — AMLODIPINE BESYLATE 10 MG PO TABS: 10.0000 mg | ORAL_TABLET | Freq: Every day | ORAL | 1 refills | Status: DC

## 2017-06-17 MED ORDER — MUPIROCIN 2 % EX OINT: 1.0000 "application " | TOPICAL_OINTMENT | Freq: Two times a day (BID) | CUTANEOUS | 0 refills | Status: DC

## 2017-06-17 MED ORDER — DOXYCYCLINE HYCLATE 100 MG PO TABS: 100.0000 mg | ORAL_TABLET | Freq: Two times a day (BID) | ORAL | 0 refills | Status: DC

## 2017-06-17 NOTE — Patient Instructions (Addendum)
Your cholesterol is excellent!. Your  Fasting  glucose  Was elevated but not diagnostic of diabetes; and your A1c was normal ,  So you are NOT AT  risk for developing type 2 Diabetes.    You have a Swollen nostril because of AN INFECTED BOIL ,  So  I am treating you with two types of antibiotics  Doxycycline 100 mg twice daily WITH FOOD to prevent nausea  MUPIROCIN Ointment to apply twice daily with a q tip  Apply warm compresses to side of nose to encourage it to drain    YOUR BLOOD PRESSURE IS STILL TOO HIGH  Increase the amlodipine to 10 mg daily  Starting today   I need to make sure your blood pressure is not elevated due to a secondary condition called "Renal artery stenosis."  I have ordered a doppler ultrasound study to look for this.  The office will call you with the appointment

## 2017-06-17 NOTE — Progress Notes (Signed)
Subjective:  Patient ID: Tiffany Benjamin, female    DOB: 1933-07-26  Age: 83 y.o. MRN: [id number]  CC: The primary encounter diagnosis was Impaired fasting glucose. Diagnoses of Uncontrolled hypertension, Furuncle of nasal cavity, and Essential hypertension were also pertinent to this visit.  HPI Tiffany Benjamin presents for follow up on multiple issues:  1) For the past week she has had pain and swelling involving the right side of her nose  The swelling has occurred on the inside of her nose but spread to the rigth side of her face below the idea,  And has improved over the last several days.  She has been applying neosporin to the inside of her nose using a q tip.Marland Kitchen   Has been congested but denies fevers,  Purulent drainage.  \  2) Hypertension follow up: Patient is taking her medications as prescribed and notes no adverse effects.  Home BP readings have been done about once per week and are  generally < 130/80 .  She is avoiding added salt in her diet and exercising at [employer] about 2 times per week for exercise  .  Outpatient Medications Prior to Visit  Medication Sig Dispense Refill  . Ascorbic Acid (VITAMIN C PO) Take 1 tablet by mouth daily.    Marland Kitchen aspirin 81 MG tablet Take 81 mg by mouth daily.    Marland Kitchen losartan (COZAAR) 100 MG tablet Take 1 tablet (100 mg total) by mouth daily. 90 tablet 1  . metoprolol succinate (TOPROL-XL) 50 MG 24 hr tablet TAKE 1 TABLET BY MOUTH DAILY TAKE WITH OR IMMEDIATELY FOLLOWING A MEAL. 90 tablet 1  . Omega 3 1000 MG CAPS 1 capsule twice daily with meals 360 each 3  . simvastatin (ZOCOR) 20 MG tablet TAKE 1 TABLET (20 MG TOTAL) BY MOUTH EVERY EVENING. 90 tablet 2  . traMADol (ULTRAM) 50 MG tablet Take 1 tablet (50 mg total) by mouth every 8 (eight) hours as needed. 90 tablet 0  . VITAMIN D, CHOLECALCIFEROL, PO Take 5,000 mg by mouth daily.    Marland Kitchen VITAMIN E PO Take 1 capsule by mouth daily.    Marland Kitchen amLODipine (NORVASC) 5 MG tablet TAKE 1 TABLET BY MOUTH DAILY. 90  tablet 1  . amLODipine (NORVASC) 10 MG tablet Take 1 tablet (10 mg total) by mouth daily. (Patient not taking: Reported on 06/17/2017) 90 tablet 1   Facility-Administered Medications Prior to Visit  Medication Dose Route Frequency Provider Last Rate Last Dose  . cloNIDine (CATAPRES) tablet 0.1 mg  0.1 mg Oral Once Sherlene Shams, MD        Review of Systems;  Patient denies headache, fevers, malaise, unintentional weight loss, skin rash, eye pain, , sore throat, dysphagia,  hemoptysis , cough, dyspnea, wheezing, chest pain, palpitations, orthopnea, edema, abdominal pain, nausea, melena, diarrhea, constipation, flank pain, dysuria, hematuria, urinary  Frequency, nocturia, numbness, tingling, seizures,  Focal weakness, Loss of consciousness,  Tremor, insomnia, depression, anxiety, and suicidal ideation.      Objective:  BP (!) 160/74 (BP Location: Left Arm, Patient Position: Sitting, Cuff Size: Normal)   Pulse (!) 59   Temp 98.6 F (37 C) (Oral)   Resp 15   Ht 5' 1.5" (1.562 m)   Wt 169 lb 6.4 oz (76.8 kg)   SpO2 97%   BMI 31.49 kg/m   BP Readings from Last 3 Encounters:  06/17/17 (!) 160/74  04/04/17 (!) 178/76  03/31/17 140/78    Wt Readings from Last  3 Encounters:  06/17/17 169 lb 6.4 oz (76.8 kg)  04/04/17 171 lb 12.8 oz (77.9 kg)  03/31/17 172 lb (78 kg)    General appearance: alert, cooperative and appears stated age Ears: normal TM's and external ear canals both ears  Nose:  Right nostril swollen,  Large boil on lateral wall,  No facial pain.  Throat: lips, mucosa, and tongue normal; teeth and gums normal Neck: no adenopathy, no carotid bruit, supple, symmetrical, trachea midline and thyroid not enlarged, symmetric, no tenderness/mass/nodules Back: symmetric, no curvature. ROM normal. No CVA tenderness. Lungs: clear to auscultation bilaterally Heart: regular rate and rhythm, S1, S2 normal, no murmur, click, rub or gallop Abdomen: soft, non-tender; bowel sounds  normal; no masses,  no organomegaly Pulses: 2+ and symmetric Skin: Skin color, texture, turgor normal. No rashes or lesions Lymph nodes: Cervical, supraclavicular, and axillary nodes normal.  Lab Results  Component Value Date   HGBA1C 5.3 06/17/2017    Lab Results  Component Value Date   CREATININE 1.08 06/15/2017   CREATININE 0.99 12/17/2016   CREATININE 1.01 12/09/2016    Lab Results  Component Value Date   WBC 6.8 03/02/2016   HGB 12.7 03/02/2016   HCT 38.5 03/02/2016   PLT 274.0 03/02/2016   GLUCOSE 111 (H) 06/15/2017   CHOL 178 06/15/2017   TRIG 90.0 06/15/2017   HDL 112.60 06/15/2017   LDLDIRECT 81.0 12/09/2016   LDLCALC 47 06/15/2017   ALT 10 06/15/2017   AST 16 06/15/2017   NA 140 06/15/2017   K 4.2 06/15/2017   CL 102 06/15/2017   CREATININE 1.08 06/15/2017   BUN 17 06/15/2017   CO2 28 06/15/2017   TSH 0.72 03/02/2016   HGBA1C 5.3 06/17/2017   MICROALBUR 27.9 (H) 12/09/2016     Assessment & Plan:   Problem List Items Addressed This Visit    Furuncle of nasal cavity    Improving per patient.  rx doxycycline and mupirocoin ointment bid for presumed MRSA infection.       Hypertension    Uncontrolled despite use of 3 medications with reported compliance.  Increase amlodipine to 10 mg daily.   ordering renal artery doppler       Relevant Medications   amLODipine (NORVASC) 10 MG tablet    Other Visit Diagnoses    Impaired fasting glucose    -  Primary   Relevant Orders   POCT HgB A1C (Completed)   Uncontrolled hypertension       Relevant Medications   amLODipine (NORVASC) 10 MG tablet   Other Relevant Orders   US Renal Artery Stenosis      I have discontinued Tiffany Benjamin's amLODipine. I have also changed her amLODipine. Additionally, I am having her start on doxycycline and mupirocin ointment. Lastly, I am having her maintain her aspirin, Omega 3, traMADol, simvastatin, metoprolol succinate, losartan, (VITAMIN D, CHOLECALCIFEROL, PO), Ascorbic  Acid (VITAMIN C PO), and VITAMIN E PO. We will continue to administer cloNIDine.  Meds ordered this encounter  Medications  . doxycycline (VIBRA-TABS) 100 MG tablet    Sig: Take 1 tablet (100 mg total) by mouth 2 (two) times daily.    Dispense:  14 tablet    Refill:  0  . mupirocin ointment (BACTROBAN) 2 %    Sig: Place 1 application into the nose 2 (two) times daily.    Dispense:  22 g    Refill:  0  . amLODipine (NORVASC) 10 MG tablet    Sig: Take 1 tablet (  10 mg total) by mouth daily.    Dispense:  90 tablet    Refill:  1    Medications Discontinued During This Encounter  Medication Reason  . amLODipine (NORVASC) 10 MG tablet Patient has not taken in last 30 days  . amLODipine (NORVASC) 5 MG tablet Reorder    Follow-up: Return in about 3 months (around 09/16/2017).   Sherlene Shams, MD

## 2017-06-19 DIAGNOSIS — J340 Abscess, furuncle and carbuncle of nose: Secondary | ICD-10-CM | POA: Insufficient documentation

## 2017-06-19 NOTE — Assessment & Plan Note (Addendum)
Uncontrolled despite use of 3 medications with reported compliance.  Increase amlodipine to 10 mg daily.   ordering renal artery doppler

## 2017-06-19 NOTE — Assessment & Plan Note (Signed)
Improving per patient.  rx doxycycline and mupirocoin ointment bid for presumed MRSA infection.

## 2017-06-28 ENCOUNTER — Other Ambulatory Visit: Payer: Self-pay | Admitting: Internal Medicine

## 2017-08-10 ENCOUNTER — Encounter (INDEPENDENT_AMBULATORY_CARE_PROVIDER_SITE_OTHER): Payer: Self-pay

## 2017-08-31 ENCOUNTER — Other Ambulatory Visit: Payer: Self-pay | Admitting: Internal Medicine

## 2017-09-13 ENCOUNTER — Other Ambulatory Visit: Payer: Self-pay | Admitting: Internal Medicine

## 2017-09-14 ENCOUNTER — Ambulatory Visit: Payer: Self-pay | Admitting: Internal Medicine

## 2017-09-19 ENCOUNTER — Ambulatory Visit: Payer: Self-pay | Admitting: Internal Medicine

## 2017-10-26 ENCOUNTER — Other Ambulatory Visit: Payer: Self-pay | Admitting: Internal Medicine

## 2017-11-29 ENCOUNTER — Other Ambulatory Visit: Payer: Self-pay | Admitting: Internal Medicine

## 2018-01-15 ENCOUNTER — Other Ambulatory Visit: Payer: Self-pay | Admitting: Internal Medicine

## 2018-04-03 ENCOUNTER — Ambulatory Visit: Payer: Self-pay

## 2018-04-26 ENCOUNTER — Other Ambulatory Visit: Payer: Self-pay | Admitting: Internal Medicine

## 2018-05-20 ENCOUNTER — Other Ambulatory Visit: Payer: Self-pay | Admitting: Internal Medicine

## 2018-05-23 NOTE — Telephone Encounter (Signed)
Last office visit 06/17/17, to follow up 3 months Tried calling patient to schedule appointment no answer , Requested wrong dose should be on 10mg 

## 2018-05-24 ENCOUNTER — Other Ambulatory Visit: Payer: Self-pay | Admitting: Internal Medicine

## 2018-06-02 ENCOUNTER — Telehealth: Payer: Self-pay

## 2018-06-02 ENCOUNTER — Ambulatory Visit (INDEPENDENT_AMBULATORY_CARE_PROVIDER_SITE_OTHER): Payer: Medicare HMO

## 2018-06-02 VITALS — BP 132/78 | HR 55 | Temp 98.2°F | Resp 14 | Ht 61.5 in | Wt 170.2 lb

## 2018-06-02 DIAGNOSIS — E559 Vitamin D deficiency, unspecified: Secondary | ICD-10-CM

## 2018-06-02 DIAGNOSIS — E785 Hyperlipidemia, unspecified: Secondary | ICD-10-CM | POA: Diagnosis not present

## 2018-06-02 DIAGNOSIS — R7301 Impaired fasting glucose: Secondary | ICD-10-CM

## 2018-06-02 DIAGNOSIS — Z0000 Encounter for general adult medical examination without abnormal findings: Secondary | ICD-10-CM

## 2018-06-02 DIAGNOSIS — R5383 Other fatigue: Secondary | ICD-10-CM | POA: Diagnosis not present

## 2018-06-02 DIAGNOSIS — I10 Essential (primary) hypertension: Secondary | ICD-10-CM | POA: Diagnosis not present

## 2018-06-02 LAB — CBC WITH DIFFERENTIAL/PLATELET
BASOS PCT: 0.6 % (ref 0.0–3.0)
Basophils Absolute: 0 10*3/uL (ref 0.0–0.1)
EOS PCT: 1.6 % (ref 0.0–5.0)
Eosinophils Absolute: 0.1 10*3/uL (ref 0.0–0.7)
HEMATOCRIT: 37.1 % (ref 36.0–46.0)
HEMOGLOBIN: 12.4 g/dL (ref 12.0–15.0)
LYMPHS PCT: 37.2 % (ref 12.0–46.0)
Lymphs Abs: 2.3 10*3/uL (ref 0.7–4.0)
MCHC: 33.4 g/dL (ref 30.0–36.0)
MCV: 96 fl (ref 78.0–100.0)
MONOS PCT: 10 % (ref 3.0–12.0)
Monocytes Absolute: 0.6 10*3/uL (ref 0.1–1.0)
Neutro Abs: 3.1 10*3/uL (ref 1.4–7.7)
Neutrophils Relative %: 50.6 % (ref 43.0–77.0)
Platelets: 310 10*3/uL (ref 150.0–400.0)
RBC: 3.86 Mil/uL — ABNORMAL LOW (ref 3.87–5.11)
RDW: 15 % (ref 11.5–15.5)
WBC: 6.1 10*3/uL (ref 4.0–10.5)

## 2018-06-02 LAB — COMPREHENSIVE METABOLIC PANEL
ALBUMIN: 4.1 g/dL (ref 3.5–5.2)
ALT: 13 U/L (ref 0–35)
AST: 19 U/L (ref 0–37)
Alkaline Phosphatase: 73 U/L (ref 39–117)
BILIRUBIN TOTAL: 0.7 mg/dL (ref 0.2–1.2)
BUN: 22 mg/dL (ref 6–23)
CO2: 29 mEq/L (ref 19–32)
CREATININE: 1.1 mg/dL (ref 0.40–1.20)
Calcium: 9.4 mg/dL (ref 8.4–10.5)
Chloride: 105 mEq/L (ref 96–112)
GFR: 60.93 mL/min (ref >60.00)
Glucose, Bld: 93 mg/dL (ref 70–99)
POTASSIUM: 4.1 meq/L (ref 3.5–5.1)
SODIUM: 142 meq/L (ref 135–145)
Total Protein: 7.7 g/dL (ref 6.0–8.3)

## 2018-06-02 LAB — LIPID PANEL
CHOLESTEROL: 178 mg/dL (ref 0–200)
HDL: 80.3 mg/dL (ref >39.00)
LDL Cholesterol: 80 mg/dL (ref 0–99)
NonHDL: 97.47
Total CHOL/HDL Ratio: 2
Triglycerides: 85 mg/dL (ref 0.0–149.0)
VLDL: 17 mg/dL (ref 0.0–40.0)

## 2018-06-02 LAB — VITAMIN D 25 HYDROXY (VIT D DEFICIENCY, FRACTURES): VITD: 69.69 ng/mL (ref 30.00–100.00)

## 2018-06-02 LAB — MICROALBUMIN / CREATININE URINE RATIO
Creatinine,U: 135 mg/dL
MICROALB UR: 10.3 mg/dL — AB (ref 0.0–1.9)
Microalb Creat Ratio: 7.6 mg/g (ref 0.0–30.0)

## 2018-06-02 LAB — TSH: TSH: 1.02 u[IU]/mL (ref 0.35–4.50)

## 2018-06-02 LAB — HEMOGLOBIN A1C: Hgb A1c MFr Bld: 5.7 % (ref 4.6–6.5)

## 2018-06-02 NOTE — Telephone Encounter (Signed)
Labs ordered for physical next week

## 2018-06-02 NOTE — Patient Instructions (Addendum)
  [name] , Thank you for taking time to come for your Medicare Wellness Visit. I appreciate your ongoing commitment to your health goals. Please review the following plan we discussed and let me know if I can assist you in the future.   Follow up as needed.    Labs  Bring a copy of your Health Care Power of Attorney and/or Living Will to be scanned into chart.  Have a great day!  These are the goals we discussed: Goals    . Encourage Social Chartered certified accountantnteraction     Contact [facility].  Educational material provided.     . Increase physical activity     Consider stationary bike or water aerobics for exercise with relief of knee pain    . LIFESTYLE - DECREASE FALLS RISK     Do not run       This is a list of the screening recommended for you and due dates:  Health Maintenance  Topic Date Due  . DEXA scan (bone density measurement)  11/29/1999  . Tetanus Vaccine  10/04/2017  . Flu Shot  05/04/2018  . Pneumonia vaccines  Completed

## 2018-06-02 NOTE — Progress Notes (Addendum)
Subjective:   Tiffany Benjamin is a 84 y.o. female who presents for Medicare Annual (Subsequent) preventive examination.  Review of Systems:  No ROS.  Medicare Wellness Visit. Additional risk factors are reflected in the social history. Cardiac Risk Factors include: advanced age (>58men, >6 women);hypertension;obesity (BMI >30kg/m2)     Objective:     Vitals: BP 132/78 (BP Location: Left Arm, Patient Position: Sitting, Cuff Size: Normal)   Pulse (!) 55   Temp 98.2 F (36.8 C) (Oral)   Resp 14   Ht 5' 1.5" (1.562 m)   Wt 170 lb 3.2 oz (77.2 kg)   SpO2 97%   BMI 31.64 kg/m   Body mass index is 31.64 kg/m.  Advanced Directives 06/02/2018 03/31/2017 03/10/2016  Does Patient Have a Medical Advance Directive? Yes Yes Yes  Type of Estate agent of [place];Living will Healthcare Power of [employer] of [place];Living will  Does patient want to make changes to medical advance directive? No - Patient declined No - Patient declined -  Copy of Healthcare Power of Attorney in Chart? No - copy requested No - copy requested No - copy requested    Tobacco Social History   Tobacco Use  Smoking Status Never Smoker  Smokeless Tobacco Never Used     Counseling given: Not Answered   Clinical Intake:  Pre-visit preparation completed: Yes           How often do you need to have someone help you when you read instructions, pamphlets, or other written materials from your doctor or pharmacy?: 1 - Never        Past Medical History:  Diagnosis Date  . Arthritis   . Depression   . Family history of polyps in the colon   . History of chicken pox   . History of colonic polyps   . Hyperlipidemia   . Hypertension   . Ulcer   . UTI (urinary tract infection)    Past Surgical History:  Procedure Laterality Date  . BREAST SURGERY Right 2001   bernign lumpectomy    Family History  Problem Relation Age of Onset  . Stroke Mother 68   cerebral hemorrhage  . Hypertension Mother   . Diabetes Mother   . Heart disease Father   . Heart attack Father 96  . Cancer Sister        pancreatic  . Cancer Sister 62       retroperitoneal Ca removed, doing fine   . Hyperlipidemia Son   . Bone cancer Son   . Diabetes Son    Social History   Socioeconomic History  . Marital status: Widowed    Spouse name: Not on file  . Number of children: Not on file  . Years of education: Not on file  . Highest education level: Not on file  Occupational History  . Not on file  Social Needs  . Financial resource strain: Not hard at all  . Food insecurity:    Worry: Never true    Inability: Never true  . Transportation needs:    Medical: No    Non-medical: No  Tobacco Use  . Smoking status: Never Smoker  . Smokeless tobacco: Never Used  Substance and Sexual Activity  . Alcohol use: Yes    Alcohol/week: 3.0 standard drinks    Types: 3 Glasses of wine per week    Comment: occasional  . Drug use: No  . Sexual activity: Never  Lifestyle  .  Physical activity:    Days per week: Not on file    Minutes per session: Not on file  . Stress: Not on file  Relationships  . Social connections:    Talks on phone: Not on file    Gets together: Not on file    Attends religious service: Not on file    Active member of club or organization: Not on file    Attends meetings of clubs or organizations: Not on file    Relationship status: Not on file  Other Topics Concern  . Not on file  Social History Narrative   Social:  Her husband died about 20 years ago from throat cancer.  She then cared for her father for  20 yrs in her home before he passed.    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 Likes to stay busy.  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   Exercises regularly, goes to [employer] 3 days a week and stretches before hand.          Outpatient Encounter Medications as of  06/02/2018  Medication Sig  . amLODipine (NORVASC) 10 MG tablet Take 1 tablet (10 mg total) by mouth daily.  . Ascorbic Acid (VITAMIN C PO) Take 1 tablet by mouth daily.  Marland Kitchen aspirin 81 MG tablet Take 81 mg by mouth daily.  Marland Kitchen doxycycline (VIBRA-TABS) 100 MG tablet Take 1 tablet (100 mg total) by mouth 2 (two) times daily.  Marland Kitchen losartan (COZAAR) 100 MG tablet TAKE 1 TABLET BY MOUTH EVERY DAY  . metoprolol succinate (TOPROL-XL) 50 MG 24 hr tablet TAKE 1 TABLET BY MOUTH DAILY TAKE WITH OR IMMEDIATELY FOLLOWING A MEAL.  . mupirocin ointment (BACTROBAN) 2 % Place 1 application into the nose 2 (two) times daily.  . Omega 3 1000 MG CAPS 1 capsule twice daily with meals  . simvastatin (ZOCOR) 20 MG tablet TAKE 1 TABLET (20 MG TOTAL) BY MOUTH EVERY EVENING.  . traMADol (ULTRAM) 50 MG tablet Take 1 tablet (50 mg total) by mouth every 8 (eight) hours as needed.  Marland Kitchen VITAMIN D, CHOLECALCIFEROL, PO Take 5,000 mg by mouth daily.  Marland Kitchen VITAMIN E PO Take 1 capsule by mouth daily.  . [DISCONTINUED] amLODipine (NORVASC) 10 MG tablet Take 1 tablet (10 mg total) by mouth daily.   Facility-Administered Encounter Medications as of 06/02/2018  Medication  . cloNIDine (CATAPRES) tablet 0.1 mg    Activities of Daily Living In your present state of health, do you have any difficulty performing the following activities: 06/02/2018  Hearing? N  Vision? N  Difficulty concentrating or making decisions? N  Walking or climbing stairs? Y  Comment Knee pain, bilateral  Dressing or bathing? N  Doing errands, shopping? Y  Comment Limited driving.   Preparing Food and eating ? Y  Comment Daughter in law cooks.  Self feeds.   Using the Toilet? N  In the past six months, have you accidently leaked urine? N  Do you have problems with loss of bowel control? N  Managing your Medications? N  Managing your Finances? N  Housekeeping or managing your Housekeeping? N  Some recent data might be hidden    Patient Care Team: Sherlene Shams, MD as PCP - General (Internal Medicine) Sherlene Shams, MD (Internal Medicine)    Assessment:   This is a routine wellness examination for Tiffany Benjamin.  The goal of the  wellness visit is to assist the patient how to close the gaps in care and create a preventative care plan for the patient.Presents with daughter in law, Tiffany Benjamin.  Information gathered form both, HIPAA compliant.   Complaints of increased knee pain. States she has received injections in the past and would like to consider this option again. Appointment scheduled with pcp next week for follow up. Requests options to daytime senior activities;  [facility] information provided.    The roster of all physicians providing medical care to patient is listed in the Snapshot section of the chart.  Taking calcium VIT D as appropriate/Osteoporosis risk reviewed.    Safety issues reviewed; Lives with son and daughter in law. Smoke and carbon monoxide detectors in the home. No firearms in the home. Wears seatbelts when driving or riding with others. No violence in the home.  They do not have excessive sun exposure.  Discussed the need for sun protection: hats, long sleeves and the use of sunscreen if there is significant sun exposure.  Patient is alert, normal appearance, oriented to person/place/and time.  She did not correctly identify the president of the [place]. She recalled 1/3 words. Performs simple calculations and can read correct time from watch face.   No failures at ADL's or IADL's.    BMI- discussed the importance of a healthy diet, water intake and the benefits of aerobic exercise. Educational material provided. She walks as tolerated on the treadmill for exercise. Due to increased knee pain, I encouraged her to use a stationary bike or water aerobics for exercise. She has a healthy diet and adequate water intake.   24 hour diet recall: Regular diet.  Good appetite.   Dental- every 12 months. Dentures.    Eye- Visual acuity not assessed per patient preference since they have regular follow up with the ophthalmologist.  Wears corrective lenses.  Sleep patterns- Sleeps 8 hours at night.  Wakes feeling rested.  Health maintenance discussed: Dexa Scan, Tdap, Influenza vaccine. Deferred for follow up with pcp per patient request.   Exercise Activities and Dietary recommendations Current Exercise Habits: Home exercise routine, Type of exercise: treadmill, Intensity: Mild  Goals    . Encourage Social Chartered certified accountant.  Educational material provided.     . Increase physical activity     Consider stationary bike or water aerobics for exercise with relief of knee pain    . LIFESTYLE - DECREASE FALLS RISK     Do not run       Fall Risk Fall Risk  06/02/2018 03/31/2017 03/10/2016 01/08/2015 10/03/2013  Falls in the past year? Yes No No No No  Number falls in past yr: 1 - - - -  Comment She was running outside to catch the garbage man and slipped. No injury.  - - - -  Injury with Fall? No - - - -  Follow up Falls prevention discussed;Education provided - - - -   Depression Screen PHQ 2/9 Scores 06/02/2018 03/31/2017 03/10/2016 01/08/2015  PHQ - 2 Score 0 0 0 0  PHQ- 9 Score - 0 - -     Cognitive Function MMSE - Mini Mental State Exam 03/10/2016  Orientation to time 5  Orientation to Place 5  Registration 3  Attention/ Calculation 5  Recall 3  Language- name 2 objects 2  Language- repeat 1  Language- follow 3 step command 3  Language- read & follow direction 1  Write a sentence  1  Copy design 1  Total score 30     6CIT Screen 06/02/2018 03/31/2017  What Year? 0 points 0 points  What month? 0 points 0 points  What time? 0 points 0 points  Count back from 20 0 points 0 points  Months in reverse 2 points 0 points  Repeat phrase - 0 points  Total Score - 0    Immunization History  Administered Date(s) Administered  . Influenza, High Dose Seasonal PF  11/25/2015  . Influenza,inj,Quad PF,6+ Mos 07/11/2013, 08/04/2016  . Influenza-Unspecified 10/04/2012  . Pneumococcal Conjugate-13 02/19/2015  . Pneumococcal Polysaccharide-23 03/04/2017  . Pneumococcal-Unspecified 10/05/2011  . Td 10/05/2007  . Zoster 07/01/2012   Screening Tests Health Maintenance  Topic Date Due  . DEXA SCAN  11/29/1999  . TETANUS/TDAP  10/04/2017  . INFLUENZA VACCINE  05/04/2018  . PNA vac Low Risk Adult  Completed      Plan:   End of life planning; Advance aging; Advanced directives discussed. Copy of current HCPOA/Living Will requested.    Fasting labs completed.   I have personally reviewed and noted the following in the patient's chart:   . Medical and social history . Use of alcohol, tobacco or illicit drugs  . Current medications and supplements . Functional ability and status . Nutritional status . Physical activity . Advanced directives . List of other physicians . Hospitalizations, surgeries, and ER visits in previous 12 months . Vitals . Screenings to include cognitive, depression, and falls . Referrals and appointments  In addition, I have reviewed and discussed with patient certain preventive protocols, quality metrics, and best practice recommendations. A written personalized care plan for preventive services as well as general preventive health recommendations were provided to patient.     OBrien-Blaney, Ayse Mccartin L, LPN  1/61/09608/30/2019   I have reviewed the above information and agree with above.   Duncan Dulleresa Tullo, MD

## 2018-06-07 ENCOUNTER — Ambulatory Visit (INDEPENDENT_AMBULATORY_CARE_PROVIDER_SITE_OTHER): Payer: Medicare HMO | Admitting: Internal Medicine

## 2018-06-07 ENCOUNTER — Encounter: Payer: Self-pay | Admitting: Internal Medicine

## 2018-06-07 VITALS — BP 155/100 | HR 55 | Temp 98.3°F | Resp 16 | Ht 61.5 in | Wt 172.0 lb

## 2018-06-07 DIAGNOSIS — Z1239 Encounter for other screening for malignant neoplasm of breast: Secondary | ICD-10-CM

## 2018-06-07 DIAGNOSIS — Z6832 Body mass index (BMI) 32.0-32.9, adult: Secondary | ICD-10-CM

## 2018-06-07 DIAGNOSIS — E559 Vitamin D deficiency, unspecified: Secondary | ICD-10-CM

## 2018-06-07 DIAGNOSIS — Z1231 Encounter for screening mammogram for malignant neoplasm of breast: Secondary | ICD-10-CM

## 2018-06-07 DIAGNOSIS — E6609 Other obesity due to excess calories: Secondary | ICD-10-CM

## 2018-06-07 DIAGNOSIS — M170 Bilateral primary osteoarthritis of knee: Secondary | ICD-10-CM

## 2018-06-07 DIAGNOSIS — Z23 Encounter for immunization: Secondary | ICD-10-CM

## 2018-06-07 DIAGNOSIS — G3184 Mild cognitive impairment, so stated: Secondary | ICD-10-CM

## 2018-06-07 DIAGNOSIS — Z0000 Encounter for general adult medical examination without abnormal findings: Secondary | ICD-10-CM

## 2018-06-07 DIAGNOSIS — E785 Hyperlipidemia, unspecified: Secondary | ICD-10-CM

## 2018-06-07 DIAGNOSIS — R413 Other amnesia: Secondary | ICD-10-CM | POA: Diagnosis not present

## 2018-06-07 DIAGNOSIS — I10 Essential (primary) hypertension: Secondary | ICD-10-CM | POA: Diagnosis not present

## 2018-06-07 MED ORDER — TETANUS-DIPHTH-ACELL PERTUSSIS 5-2.5-18.5 LF-MCG/0.5 IM SUSP: 0.5000 mL | Freq: Once | INTRAMUSCULAR | 0 refills | Status: AC

## 2018-06-07 NOTE — Patient Instructions (Addendum)
Referral to Dr Marry Guan for knee pain    Screening labs for memory loss will be done today   You can take up to 2000 mg of acetominophen (tylenol) every day safely  In divided doses (500 mg every 6 hours  Or 1000 mg every 12 hours.  Pleas ask [facility] to check Tiffany Benjamin's  BP a few times over the next month and send me the readings .  Goal is 130/80 or less   Please listen to Tiffany Benjamin's breathing at night when she is asleep . She may have sleep apnea   Health Maintenance for Postmenopausal Women Menopause is a normal process in which your reproductive ability comes to an end. This process happens gradually over a span of months to years, usually between the ages of 65 and 77. Menopause is complete when you have missed 12 consecutive menstrual periods. It is important to talk with your health care provider about some of the most common conditions that affect postmenopausal women, such as heart disease, cancer, and bone loss (osteoporosis). Adopting a healthy lifestyle and getting preventive care can help to promote your health and wellness. Those actions can also lower your chances of developing some of these common conditions. What should I know about menopause? During menopause, you may experience a number of symptoms, such as:  Moderate-to-severe hot flashes.  Night sweats.  Decrease in sex drive.  Mood swings.  Headaches.  Tiredness.  Irritability.  Memory problems.  Insomnia.  Choosing to treat or not to treat menopausal changes is an individual decision that you make with your health care provider. What should I know about hormone replacement therapy and supplements? Hormone therapy products are effective for treating symptoms that are associated with menopause, such as hot flashes and night sweats. Hormone replacement carries certain risks, especially as you become older. If you are thinking about using estrogen or estrogen with progestin treatments, discuss the benefits and  risks with your health care provider. What should I know about heart disease and stroke? Heart disease, heart attack, and stroke become more likely as you age. This may be due, in part, to the hormonal changes that your body experiences during menopause. These can affect how your body processes dietary fats, triglycerides, and cholesterol. Heart attack and stroke are both medical emergencies. There are many things that you can do to help prevent heart disease and stroke:  Have your blood pressure checked at least every 1-2 years. High blood pressure causes heart disease and increases the risk of stroke.  If you are 57-20 years old, ask your health care provider if you should take aspirin to prevent a heart attack or a stroke.  Do not use any tobacco products, including cigarettes, chewing tobacco, or electronic cigarettes. If you need help quitting, ask your health care provider.  It is important to eat a healthy diet and maintain a healthy weight. ? Be sure to include plenty of vegetables, fruits, low-fat dairy products, and lean protein. ? Avoid eating foods that are high in solid fats, added sugars, or salt (sodium).  Get regular exercise. This is one of the most important things that you can do for your health. ? Try to exercise for at least 150 minutes each week. The type of exercise that you do should increase your heart rate and make you sweat. This is known as moderate-intensity exercise. ? Try to do strengthening exercises at least twice each week. Do these in addition to the moderate-intensity exercise.  Know your numbers.Ask  your health care provider to check your cholesterol and your blood glucose. Continue to have your blood tested as directed by your health care provider.  What should I know about cancer screening? There are several types of cancer. Take the following steps to reduce your risk and to catch any cancer development as early as possible. Breast Cancer  Practice  breast self-awareness. ? This means understanding how your breasts normally appear and feel. ? It also means doing regular breast self-exams. Let your health care provider know about any changes, no matter how small.  If you are 4 or older, have a clinician do a breast exam (clinical breast exam or CBE) every year. Depending on your age, family history, and medical history, it may be recommended that you also have a yearly breast X-ray (mammogram).  If you have a family history of breast cancer, talk with your health care provider about genetic screening.  If you are at high risk for breast cancer, talk with your health care provider about having an MRI and a mammogram every year.  Breast cancer (BRCA) gene test is recommended for women who have family members with BRCA-related cancers. Results of the assessment will determine the need for genetic counseling and BRCA1 and for BRCA2 testing. BRCA-related cancers include these types: ? Breast. This occurs in males or females. ? Ovarian. ? Tubal. This may also be called fallopian tube cancer. ? Cancer of the abdominal or pelvic lining (peritoneal cancer). ? Prostate. ? Pancreatic.  Cervical, Uterine, and Ovarian Cancer Your health care provider may recommend that you be screened regularly for cancer of the pelvic organs. These include your ovaries, uterus, and vagina. This screening involves a pelvic exam, which includes checking for microscopic changes to the surface of your cervix (Pap test).  For women ages 21-65, health care providers may recommend a pelvic exam and a Pap test every three years. For women ages 66-65, they may recommend the Pap test and pelvic exam, combined with testing for human papilloma virus (HPV), every five years. Some types of HPV increase your risk of cervical cancer. Testing for HPV may also be done on women of any age who have unclear Pap test results.  Other health care providers may not recommend any screening  for nonpregnant women who are considered low risk for pelvic cancer and have no symptoms. Ask your health care provider if a screening pelvic exam is right for you.  If you have had past treatment for cervical cancer or a condition that could lead to cancer, you need Pap tests and screening for cancer for at least 20 years after your treatment. If Pap tests have been discontinued for you, your risk factors (such as having a new sexual partner) need to be reassessed to determine if you should start having screenings again. Some women have medical problems that increase the chance of getting cervical cancer. In these cases, your health care provider may recommend that you have screening and Pap tests more often.  If you have a family history of uterine cancer or ovarian cancer, talk with your health care provider about genetic screening.  If you have vaginal bleeding after reaching menopause, tell your health care provider.  There are currently no reliable tests available to screen for ovarian cancer.  Lung Cancer Lung cancer screening is recommended for adults 58-55 years old who are at high risk for lung cancer because of a history of smoking. A yearly low-dose CT scan of the lungs is recommended  if you:  Currently smoke.  Have a history of at least 30 pack-years of smoking and you currently smoke or have quit within the past 15 years. A pack-year is smoking an average of one pack of cigarettes per day for one year.  Yearly screening should:  Continue until it has been 15 years since you quit.  Stop if you develop a health problem that would prevent you from having lung cancer treatment.  Colorectal Cancer  This type of cancer can be detected and can often be prevented.  Routine colorectal cancer screening usually begins at age 65 and continues through age 62.  If you have risk factors for colon cancer, your health care provider may recommend that you be screened at an earlier age.  If  you have a family history of colorectal cancer, talk with your health care provider about genetic screening.  Your health care provider may also recommend using home test kits to check for hidden blood in your stool.  A small camera at the end of a tube can be used to examine your colon directly (sigmoidoscopy or colonoscopy). This is done to check for the earliest forms of colorectal cancer.  Direct examination of the colon should be repeated every 5-10 years until age 67. However, if early forms of precancerous polyps or small growths are found or if you have a family history or genetic risk for colorectal cancer, you may need to be screened more often.  Skin Cancer  Check your skin from head to toe regularly.  Monitor any moles. Be sure to tell your health care provider: ? About any new moles or changes in moles, especially if there is a change in a mole's shape or color. ? If you have a mole that is larger than the size of a pencil eraser.  If any of your family members has a history of skin cancer, especially at a young age, talk with your health care provider about genetic screening.  Always use sunscreen. Apply sunscreen liberally and repeatedly throughout the day.  Whenever you are outside, protect yourself by wearing long sleeves, pants, a wide-brimmed hat, and sunglasses.  What should I know about osteoporosis? Osteoporosis is a condition in which bone destruction happens more quickly than new bone creation. After menopause, you may be at an increased risk for osteoporosis. To help prevent osteoporosis or the bone fractures that can happen because of osteoporosis, the following is recommended:  If you are 52-63 years old, get at least 1,000 mg of calcium and at least 600 mg of vitamin D per day.  If you are older than age 6 but younger than age 35, get at least 1,200 mg of calcium and at least 600 mg of vitamin D per day.  If you are older than age 65, get at least 1,200 mg of  calcium and at least 800 mg of vitamin D per day.  Smoking and excessive alcohol intake increase the risk of osteoporosis. Eat foods that are rich in calcium and vitamin D, and do weight-bearing exercises several times each week as directed by your health care provider. What should I know about how menopause affects my mental health? Depression may occur at any age, but it is more common as you become older. Common symptoms of depression include:  Low or sad mood.  Changes in sleep patterns.  Changes in appetite or eating patterns.  Feeling an overall lack of motivation or enjoyment of activities that you previously enjoyed.  Frequent  crying spells.  Talk with your health care provider if you think that you are experiencing depression. What should I know about immunizations? It is important that you get and maintain your immunizations. These include:  Tetanus, diphtheria, and pertussis (Tdap) booster vaccine.  Influenza every year before the flu season begins.  Pneumonia vaccine.  Shingles vaccine.  Your health care provider may also recommend other immunizations. This information is not intended to replace advice given to you by your health care provider. Make sure you discuss any questions you have with your health care provider. Document Released: 11/12/2005 Document Revised: 04/09/2016 Document Reviewed: 06/24/2015 Elsevier Interactive Patient Education  2018 [employer].

## 2018-06-07 NOTE — Progress Notes (Signed)
Patient ID: Tiffany Benjamin, female    DOB: 1933-10-03  Age: 84 y.o. MRN: [id number]  The patient is here for annual preventive examination and management of other chronic and acute problems.   Last mammogram 2015,  Last ordered 2017   The risk factors are reflected in the social history.  The roster of all physicians providing medical care to patient - is listed in the Snapshot section of the chart.  Activities of daily living:  The patient is 100% independent in all ADLs: dressing, toileting, feeding as well as independent mobility  Home safety : The patient has smoke detectors in the home. They wear seatbelts.  There are no firearms at home. There is no violence in the home.   There is no risks for hepatitis, STDs or HIV. There is no   history of blood transfusion. They have no travel history to infectious disease endemic areas of the world.  The patient has seen their dentist in the last six month. They have seen their eye doctor in the last year. They admit to slight hearing difficulty with regard to whispered voices and some television programs.  They have deferred audiologic testing in the last year.  They do not  have excessive sun exposure. Discussed the need for sun protection: hats, long sleeves and use of sunscreen if there is significant sun exposure.   Diet: the importance of a healthy diet is discussed. They do have a healthy diet.  The benefits of regular aerobic exercise were discussed. She does not exercise or walk regularly  Due to persistent knee pain . . .   Depression screen: there are no signs or vegative symptoms of depression- irritability, change in appetite, anhedonia, sadness/tearfullness.  Cognitive assessment: the patient's son has noticed memory loss and increasing frailty> He and his wife have relocated from up [place] to live with patient.    She continues to manage  Her  financial and personal affairs and is actively engaged. They could relate day,date,year and  events; recalled 2/3 objects at 3 minutes; performed clock-face test normally. Her calculation skills were weak, bu she could caluclate the number of quarters in $1.50  The following portions of the patient's history were reviewed and updated as appropriate: allergies, current medications, past family history, past medical history,  past surgical history, past social history  and problem list.  Visual acuity was not assessed per patient preference since she has regular follow up with her ophthalmologist. Hearing and body mass index were assessed and reviewed.   During the course of the visit the patient was educated and counseled about appropriate screening and preventive services including : fall prevention , diabetes screening, nutrition counseling, colorectal cancer screening, and recommended immunizations.    CC: The primary encounter diagnosis was Breast cancer screening. Diagnoses of Essential hypertension, Memory loss, Vitamin D deficiency, Hyperlipidemia, unspecified hyperlipidemia type, Need for influenza vaccination, Encounter for preventive health examination, Tricompartment osteoarthritis of both knees, Class 1 obesity due to excess calories without serious comorbidity with body mass index (BMI) of 32.0 to 32.9 in adult, and Mild cognitive impairment with memory loss were also pertinent to this visit.  IPG:  Last seen in September .  She is careful about not having too many desserts  And avoids junk food.  Severe OA knee.  Had a steroid injection last year.   Takes tylenol as needed for pain.  Pain is interfering with life and Orthopedic  consult discussed; she wants to  avoid surgery  Hypertension : BP was normal last week during AWV .  Son notices she doesn't sleep well and dozes a a lot durin g the day wakes up tired   Discussed referral to [facility]  If coverage allows  History Tiffany Benjamin has a past medical history of Arthritis, Depression, Family history of polyps in the colon, History of  chicken pox, History of colonic polyps, Hyperlipidemia, Hypertension, Ulcer, and UTI (urinary tract infection).   She has a past surgical history that includes Breast surgery (Right, 2001).   Her family history includes Bone cancer in her son; Cancer in her sister; Cancer (age of onset: 103) in her sister; Diabetes in her mother and son; Heart attack (age of onset: 51) in her father; Heart disease in her father; Hyperlipidemia in her son; Hypertension in her mother; Stroke (age of onset: 57) in her mother.She reports that she has never smoked. She has never used smokeless tobacco. She reports that she drinks about 3.0 standard drinks of alcohol per week. She reports that she does not use drugs.  Outpatient Medications Prior to Visit  Medication Sig Dispense Refill  . amLODipine (NORVASC) 10 MG tablet Take 1 tablet (10 mg total) by mouth daily. 90 tablet 1  . Ascorbic Acid (VITAMIN C PO) Take 1 tablet by mouth daily.    Marland Kitchen aspirin 81 MG tablet Take 81 mg by mouth daily.    Marland Kitchen losartan (COZAAR) 100 MG tablet TAKE 1 TABLET BY MOUTH EVERY DAY 90 tablet 1  . metoprolol succinate (TOPROL-XL) 50 MG 24 hr tablet TAKE 1 TABLET BY MOUTH DAILY TAKE WITH OR IMMEDIATELY FOLLOWING A MEAL. 90 tablet 1  . Omega 3 1000 MG CAPS 1 capsule twice daily with meals 360 each 3  . simvastatin (ZOCOR) 20 MG tablet TAKE 1 TABLET (20 MG TOTAL) BY MOUTH EVERY EVENING. 90 tablet 1  . traMADol (ULTRAM) 50 MG tablet Take 1 tablet (50 mg total) by mouth every 8 (eight) hours as needed. 90 tablet 0  . VITAMIN D, CHOLECALCIFEROL, PO Take 5,000 mg by mouth daily.    Marland Kitchen VITAMIN E PO Take 1 capsule by mouth daily.    Marland Kitchen doxycycline (VIBRA-TABS) 100 MG tablet Take 1 tablet (100 mg total) by mouth 2 (two) times daily. (Patient not taking: Reported on 06/07/2018) 14 tablet 0  . mupirocin ointment (BACTROBAN) 2 % Place 1 application into the nose 2 (two) times daily. (Patient not taking: Reported on 06/07/2018) 22 g 0   Facility-Administered  Medications Prior to Visit  Medication Dose Route Frequency Provider Last Rate Last Dose  . cloNIDine (CATAPRES) tablet 0.1 mg  0.1 mg Oral Once Sherlene Shams, MD        Review of Systems   Patient denies headache, fevers, malaise, unintentional weight loss, skin rash, eye pain, sinus congestion and sinus pain, sore throat, dysphagia,  hemoptysis , cough, dyspnea, wheezing, chest pain, palpitations, orthopnea, edema, abdominal pain, nausea, melena, diarrhea, constipation, flank pain, dysuria, hematuria, urinary  Frequency, nocturia, numbness, tingling, seizures,  Focal weakness, Loss of consciousness,  Tremor, insomnia, depression, anxiety, and suicidal ideation.      Objective:  BP (!) 155/100 (BP Location: Left Arm, Patient Position: Sitting, Cuff Size: Normal)   Pulse (!) 55   Temp 98.3 F (36.8 C) (Oral)   Resp 16   Ht 5' 1.5" (1.562 m)   Wt 172 lb (78 kg)   SpO2 97%   BMI 31.97 kg/m   Physical Exam   General appearance: alert,  cooperative and appears stated age Ears: normal TM's and external ear canals both ears Throat: lips, mucosa, and tongue normal; teeth and gums normal Neck: no adenopathy, no carotid bruit, supple, symmetrical, trachea midline and thyroid not enlarged, symmetric, no tenderness/mass/nodules Back: symmetric, no curvature. ROM normal. No CVA tenderness. Breast exam: deferred by patient  Lungs: clear to auscultation bilaterally Heart: regular rate and rhythm, S1, S2 normal, no murmur, click, rub or gallop Abdomen: soft, non-tender; bowel sounds normal; no masses,  no organomegaly Pulses: 2+ and symmetric Skin: Skin color, texture, turgor normal. No rashes or lesions Lymph nodes: Cervical, supraclavicular, and axillary nodes normal.   Assessment & Plan:   Problem List Items Addressed This Visit    Encounter for preventive health examination    Annual comprehensive preventive exam was done as well as an evaluation and management of chronic conditions  .  During the course of the visit the patient was educated and counseled about appropriate screening and preventive services including :  diabetes screening, lipid analysis with projected  10 year  risk for CAD , nutrition counseling, breast cancer screening  and recommended immunizations.  Printed recommendations for health maintenance screenings was given      Hyperlipidemia    Managed with simvastatin.  she has no side effects and liver enzymes and lipids are overdue . No changes today  Lab Results  Component Value Date   CHOL 175 06/07/2018   HDL 79.60 06/07/2018   LDLCALC 66 06/07/2018   LDLDIRECT 81.0 12/09/2016   TRIG 146.0 06/07/2018   CHOLHDL 2 06/07/2018   Lab Results  Component Value Date   ALT 15 06/07/2018   AST 18 06/07/2018   ALKPHOS 80 06/07/2018   BILITOT 0.4 06/07/2018   Lab Results  Component Value Date   LDLCALC 66 06/07/2018              Relevant Orders   Lipid panel (Completed)   Hypertension    Elevated today. she reports compliance with medication regimen and doe snot take NSAIDS  but has an elevated reading today in office.  He has been asked to check her BP at home and  submit readings for evaluation. Renal function will be checked today.  Discussed need to look for secondary causes with son including sleep apnea and RAS       Relevant Orders   Comprehensive metabolic panel (Completed)   Mild cognitive impairment with memory loss    Screening labs normal..  Referral for MRI and Neurology evaluation  advised.       Obesity    I have addressed  BMI and recommended wt loss of 10% of body weight over the next 6 months using a low glycemic index diet and regular exercise a minimum of 5 days per week.        Tricompartment Severe Knee OA, Bilateral    Last steroid injection was one year ago.Marland Kitchen Recommend Ortho referral for consideratio nof Synvisc vs TKR      Relevant Orders   Ambulatory referral to Orthopedic Surgery    Other Visit  Diagnoses    Breast cancer screening    -  Primary   Relevant Orders   MM 3D SCREEN BREAST BILATERAL   VITAMIN D 25 Hydroxy (Vit-D Deficiency, Fractures) (Completed)   Memory loss       Relevant Orders   TSH (Completed)   RPR (Completed)   Vitamin B12 (Completed)   Vitamin D deficiency  Need for influenza vaccination       Relevant Orders   Flu vaccine HIGH DOSE PF (Fluzone High dose) (Completed)      I have discontinued Shweta M. Dinning's doxycycline and mupirocin ointment. I am also having her start on Tdap. Additionally, I am having her maintain her aspirin, Omega 3, traMADol, (VITAMIN D, CHOLECALCIFEROL, PO), Ascorbic Acid (VITAMIN C PO), VITAMIN E PO, amLODipine, simvastatin, metoprolol succinate, and losartan. We will continue to administer cloNIDine.  Meds ordered this encounter  Medications  . Tdap (BOOSTRIX) 5-2.5-18.5 LF-MCG/0.5 injection    Sig: Inject 0.5 mLs into the muscle once for 1 dose.    Dispense:  0.5 mL    Refill:  0    Medications Discontinued During This Encounter  Medication Reason  . doxycycline (VIBRA-TABS) 100 MG tablet Completed Course  . mupirocin ointment (BACTROBAN) 2 % Completed Course    Follow-up: Return in about 3 months (around 09/06/2018).   Sherlene Shams, MD

## 2018-06-08 LAB — LIPID PANEL
CHOL/HDL RATIO: 2
Cholesterol: 175 mg/dL (ref 0–200)
HDL: 79.6 mg/dL (ref >39.00)
LDL Cholesterol: 66 mg/dL (ref 0–99)
NonHDL: 95.57
Triglycerides: 146 mg/dL (ref 0.0–149.0)
VLDL: 29.2 mg/dL (ref 0.0–40.0)

## 2018-06-08 LAB — COMPREHENSIVE METABOLIC PANEL
ALT: 15 U/L (ref 0–35)
AST: 18 U/L (ref 0–37)
Albumin: 4.2 g/dL (ref 3.5–5.2)
Alkaline Phosphatase: 80 U/L (ref 39–117)
BUN: 21 mg/dL (ref 6–23)
CO2: 30 meq/L (ref 19–32)
Calcium: 9.2 mg/dL (ref 8.4–10.5)
Chloride: 106 mEq/L (ref 96–112)
Creatinine, Ser: 1.23 mg/dL — ABNORMAL HIGH (ref 0.40–1.20)
GFR: 53.56 mL/min — ABNORMAL LOW (ref >60.00)
Glucose, Bld: 91 mg/dL (ref 70–99)
POTASSIUM: 4.3 meq/L (ref 3.5–5.1)
SODIUM: 143 meq/L (ref 135–145)
Total Bilirubin: 0.4 mg/dL (ref 0.2–1.2)
Total Protein: 7.4 g/dL (ref 6.0–8.3)

## 2018-06-08 LAB — VITAMIN D 25 HYDROXY (VIT D DEFICIENCY, FRACTURES): VITD: 71.26 ng/mL (ref 30.00–100.00)

## 2018-06-08 LAB — RPR: RPR Ser Ql: NONREACTIVE

## 2018-06-08 LAB — VITAMIN B12: Vitamin B-12: 1115 pg/mL — ABNORMAL HIGH (ref 211–911)

## 2018-06-08 LAB — TSH: TSH: 1.06 u[IU]/mL (ref 0.35–4.50)

## 2018-06-10 DIAGNOSIS — G3184 Mild cognitive impairment, so stated: Secondary | ICD-10-CM | POA: Insufficient documentation

## 2018-06-10 NOTE — Assessment & Plan Note (Signed)
Last steroid injection was one year ago.Marland Kitchen Recommend Ortho referral for consideratio nof Synvisc vs TKR

## 2018-06-10 NOTE — Assessment & Plan Note (Signed)
Screening labs normal..  Referral for MRI and Neurology evaluation  advised.

## 2018-06-10 NOTE — Assessment & Plan Note (Signed)
I have addressed  BMI and recommended wt loss of 10% of body weight over the next 6 months using a low glycemic index diet and regular exercise a minimum of 5 days per week.   

## 2018-06-10 NOTE — Assessment & Plan Note (Signed)
Elevated today. she reports compliance with medication regimen and doe snot take NSAIDS  but has an elevated reading today in office.  He has been asked to check her BP at home and  submit readings for evaluation. Renal function will be checked today.  Discussed need to look for secondary causes with son including sleep apnea and RAS

## 2018-06-10 NOTE — Assessment & Plan Note (Addendum)
Annual comprehensive preventive exam was done as well as an evaluation and management of chronic conditions .  During the course of the visit the patient was educated and counseled about appropriate screening and preventive services including :  diabetes screening, lipid analysis with projected  10 year  risk for CAD , nutrition counseling, breast cancer screening  and recommended immunizations.  Printed recommendations for health maintenance screenings was given 

## 2018-06-10 NOTE — Assessment & Plan Note (Signed)
Managed with simvastatin.  she has no side effects and liver enzymes and lipids are overdue . No changes today  Lab Results  Component Value Date   CHOL 175 06/07/2018   HDL 79.60 06/07/2018   LDLCALC 66 06/07/2018   LDLDIRECT 81.0 12/09/2016   TRIG 146.0 06/07/2018   CHOLHDL 2 06/07/2018   Lab Results  Component Value Date   ALT 15 06/07/2018   AST 18 06/07/2018   ALKPHOS 80 06/07/2018   BILITOT 0.4 06/07/2018   Lab Results  Component Value Date   LDLCALC 66 06/07/2018

## 2018-06-24 ENCOUNTER — Other Ambulatory Visit: Payer: Self-pay | Admitting: Internal Medicine

## 2018-07-10 ENCOUNTER — Other Ambulatory Visit: Payer: Self-pay | Admitting: Internal Medicine

## 2018-07-14 ENCOUNTER — Telehealth: Payer: Self-pay | Admitting: Internal Medicine

## 2018-07-14 NOTE — Telephone Encounter (Signed)
Copied from CRM [id number]. Topic: Referral - Question >> Jul 14, 2018 10:59 AM Crist Infante wrote: Reason for CRM: pt is still having severe knee pain in both knees.  She states it was discussed at visit 06/07/18. Pt states it is hard to walk. Pt would like to know what you would have her due next? Pt states you did give her an injection a year or so ago, would like to know can you do again?

## 2018-07-17 NOTE — Telephone Encounter (Signed)
Yes I can inject her knees again.  Please offer her an appointment.

## 2018-07-17 NOTE — Telephone Encounter (Signed)
LMTCB. PEC may speak with pt.  

## 2018-07-18 NOTE — Telephone Encounter (Signed)
Pt is scheduled for knee injection on 07/27/2018.

## 2018-07-20 ENCOUNTER — Ambulatory Visit: Payer: Self-pay | Admitting: Internal Medicine

## 2018-07-27 ENCOUNTER — Ambulatory Visit (INDEPENDENT_AMBULATORY_CARE_PROVIDER_SITE_OTHER): Payer: Medicare HMO | Admitting: Internal Medicine

## 2018-07-27 ENCOUNTER — Encounter: Payer: Self-pay | Admitting: Internal Medicine

## 2018-07-27 VITALS — BP 180/96 | HR 67 | Temp 98.3°F | Resp 16 | Ht 61.5 in | Wt 174.0 lb

## 2018-07-27 DIAGNOSIS — M25562 Pain in left knee: Secondary | ICD-10-CM | POA: Diagnosis not present

## 2018-07-27 DIAGNOSIS — M170 Bilateral primary osteoarthritis of knee: Secondary | ICD-10-CM | POA: Diagnosis not present

## 2018-07-27 DIAGNOSIS — F411 Generalized anxiety disorder: Secondary | ICD-10-CM

## 2018-07-27 DIAGNOSIS — M25561 Pain in right knee: Secondary | ICD-10-CM | POA: Diagnosis not present

## 2018-07-27 DIAGNOSIS — I10 Essential (primary) hypertension: Secondary | ICD-10-CM | POA: Diagnosis not present

## 2018-07-27 MED ORDER — BUSPIRONE HCL 10 MG PO TABS: 10.0000 mg | ORAL_TABLET | Freq: Three times a day (TID) | ORAL | 0 refills | Status: DC

## 2018-07-27 NOTE — Progress Notes (Signed)
Subjective:  Patient ID: Tiffany Benjamin, female    DOB: [birth year]  Age: 84 y.o. MRN: [id number]  CC: The primary encounter diagnosis was Pain in both knees, unspecified chronicity. Diagnoses of Tricompartment osteoarthritis of both knees, Generalized anxiety disorder, and Essential hypertension were also pertinent to this visit. she is accompanied by her son [name] today   HPI [name] presents for  1) steroid injection of both knees .  She has severe DJD and has pain that prohibitis her from walking for more than 100 feet   Hypertension:  He r BP elevated  Despite reported compliance  with amlodipine ,  Losartan and metoprolol.  Reviewed times of administration ,which is at noon for all 3.   Son requesting a medication be prescribed for patient  that will address her depression.  patient denies feeling depressed   But admits that she is "lonely" because she sits at home all day while son and dtr in law work full time.   son Outpatient Medications Prior to Visit  Medication Sig Dispense Refill  . amLODipine (NORVASC) 10 MG tablet TAKE 1 TABLET BY MOUTH EVERY DAY 90 tablet 1  . Ascorbic Acid (VITAMIN C PO) Take 1 tablet by mouth daily.    Marland Kitchen aspirin 81 MG tablet Take 81 mg by mouth daily.    Marland Kitchen losartan (COZAAR) 100 MG tablet TAKE 1 TABLET BY MOUTH EVERY DAY 90 tablet 1  . metoprolol succinate (TOPROL-XL) 50 MG 24 hr tablet TAKE 1 TABLET BY MOUTH DAILY TAKE WITH OR IMMEDIATELY FOLLOWING A MEAL. 90 tablet 1  . Omega 3 1000 MG CAPS 1 capsule twice daily with meals 360 each 3  . simvastatin (ZOCOR) 20 MG tablet TAKE 1 TABLET (20 MG TOTAL) BY MOUTH EVERY EVENING. 90 tablet 1  . traMADol (ULTRAM) 50 MG tablet Take 1 tablet (50 mg total) by mouth every 8 (eight) hours as needed. 90 tablet 0  . VITAMIN D, CHOLECALCIFEROL, PO Take 5,000 mg by mouth daily.    Marland Kitchen VITAMIN E PO Take 1 capsule by mouth daily.    Marland Kitchen amLODipine (NORVASC) 10 MG tablet Take 1 tablet (10 mg total) by mouth daily.  (Patient not taking: Reported on 07/27/2018) 90 tablet 1   Facility-Administered Medications Prior to Visit  Medication Dose Route Frequency Provider Last Rate Last Dose  . cloNIDine (CATAPRES) tablet 0.1 mg  0.1 mg Oral Once Sherlene Shams, MD        Review of Systems;  Patient denies headache, fevers, malaise, unintentional weight loss, skin rash, eye pain, sinus congestion and sinus pain, sore throat, dysphagia,  hemoptysis , cough, dyspnea, wheezing, chest pain, palpitations, orthopnea, edema, abdominal pain, nausea, melena, diarrhea, constipation, flank pain, dysuria, hematuria, urinary  Frequency, nocturia, numbness, tingling, seizures,  Focal weakness, Loss of consciousness,  Tremor, insomnia, depression, anxiety, and suicidal ideation.      Objective:  BP (!) 180/96 (BP Location: Left Arm, Patient Position: Sitting, Cuff Size: Normal)   Pulse 67   Temp 98.3 F (36.8 C) (Oral)   Resp 16   Ht 5' 1.5" (1.562 m)   Wt 174 lb (78.9 kg)   SpO2 99%   BMI 32.34 kg/m   BP Readings from Last 3 Encounters:  07/27/18 (!) 180/96  06/07/18 (!) 155/100  06/02/18 132/78    Wt Readings from Last 3 Encounters:  07/27/18 174 lb (78.9 kg)  06/07/18 172 lb (78 kg)  06/02/18 170 lb 3.2 oz (77.2 kg)  General appearance: alert, cooperative and appears stated age Ears: normal TM's and external ear canals both ears Throat: lips, mucosa, and tongue normal; teeth and gums normal Neck: no adenopathy, no carotid bruit, supple, symmetrical, trachea midline and thyroid not enlarged, symmetric, no tenderness/mass/nodules Back: symmetric, no curvature. ROM normal. No CVA tenderness. Lungs: clear to auscultation bilaterally Heart: regular rate and rhythm, S1, S2 normal, no murmur, click, rub or gallop Abdomen: soft, non-tender; bowel sounds normal; no masses,  no organomegaly Pulses: 2+ and symmetric Skin: Skin color, texture, turgor normal. No rashes or lesions Lymph nodes: Cervical,  supraclavicular, and axillary nodes normal.    Assessment & Plan:   Problem List Items Addressed This Visit    Generalized anxiety disorder    She does not want to start a daily medication for anxiety. The risks and benefits of benzodiazepine use were discussed with patient today including excessive sedation leading to respiratory depression,  impaired thinking/driving, and addiction.  Patient was prescribved Buspirone to use instead.       Relevant Medications   busPIRone (BUSPAR) 10 MG tablet   Hypertension    Advised to take the metoprolol at night an d continue the other meds during the day       Knee pain, bilateral - Primary     Informed consent for joint injections of bilateral kneeds  obtained.  Areas were cleaned with betadine.  Left knee was covered with a sterile drape while right knee was injected under the patella from a superomedial approach. Sub patellar  bursa  was injected with Kenalog and Xylocaine .   Procedure was tolerated well and identical procedure was done to the left knee using a superomedial approach.  She was advised to rest the knees  for 48 hours , apply ice packs every 6 hours for 15 minutes, advised to call if she develops signs of infection        Relevant Medications   triamcinolone acetonide (KENALOG-40) injection 40 mg (Completed)   triamcinolone acetonide (KENALOG-40) injection 40 mg (Completed)   lidocaine-EPINEPHrine (XYLOCAINE W/EPI) 1 %-1:100000 (with pres) injection 4 mL (Completed)   lidocaine-EPINEPHrine (XYLOCAINE W/EPI) 1 %-1:100000 (with pres) injection 4 mL (Completed)   Tricompartment Severe Knee OA, Bilateral    She is in constant pain despite having tramadol for use .   Steroid injection s given .      Relevant Medications   triamcinolone acetonide (KENALOG-40) injection 40 mg (Completed)   triamcinolone acetonide (KENALOG-40) injection 40 mg (Completed)      I am having Maddix M. Banko start on busPIRone. I am also having her  maintain her aspirin, Omega 3, traMADol, (VITAMIN D, CHOLECALCIFEROL, PO), Ascorbic Acid (VITAMIN C PO), VITAMIN E PO, losartan, amLODipine, simvastatin, and metoprolol succinate. We administered triamcinolone acetonide, triamcinolone acetonide, lidocaine-EPINEPHrine, and lidocaine-EPINEPHrine. We will continue to administer cloNIDine.  Meds ordered this encounter  Medications  . busPIRone (BUSPAR) 10 MG tablet    Sig: Take 1 tablet (10 mg total) by mouth 3 (three) times daily.    Dispense:  90 tablet    Refill:  0  . triamcinolone acetonide (KENALOG-40) injection 40 mg  . triamcinolone acetonide (KENALOG-40) injection 40 mg  . lidocaine-EPINEPHrine (XYLOCAINE W/EPI) 1 %-1:100000 (with pres) injection 4 mL  . lidocaine-EPINEPHrine (XYLOCAINE W/EPI) 1 %-1:100000 (with pres) injection 4 mL    Medications Discontinued During This Encounter  Medication Reason  . amLODipine (NORVASC) 10 MG tablet Duplicate    Follow-up: No follow-ups on file.  Crecencio Mc, MD

## 2018-07-27 NOTE — Patient Instructions (Addendum)
Start taking your metoprolol at night.  Continue amlodipine and losartan at lunch Please have [name] check  Your blood pressure  3 times over the next week and send me the readings  I am prescribing buspirone to  Take as needed for when you are feeling down   Knee Injection, Care After Refer to this sheet in the next few weeks. These instructions provide you with information about caring for yourself after your procedure. Your health care provider may also give you more specific instructions. Your treatment has been planned according to current medical practices, but problems sometimes occur. Call your health care provider if you have any problems or questions after your procedure. What can I expect after the procedure? After the procedure, it is common to have:  Soreness.  Warmth.  Swelling.  You may have more pain, swelling, and warmth than you did before the injection. This reaction may last for about one day. Follow these instructions at home: Bathing  If you were given a bandage (dressing), keep it dry until your health care provider says it can be removed. Ask your health care provider when you can start showering or taking a bath. Managing pain, stiffness, and swelling  If directed, apply ice to the injection area: ? Put ice in a plastic bag. ? Place a towel between your skin and the bag. ? Leave the ice on for 20 minutes, 2-3 times per day.  Do not apply heat to your knee.  Raise the injection area above the level of your heart while you are sitting or lying down. Activity  Avoid strenuous activities for as long as directed by your health care provider. Ask your health care provider when you can return to your normal activities. General instructions  Take medicines only as directed by your health care provider.  Do not take aspirin or other over-the-counter medicines unless your health care provider says you can.  Check your injection site every day for signs of  infection. Watch for: ? Redness, swelling, or pain. ? Fluid, blood, or pus.  Follow your health care provider's instructions about dressing changes and removal. Contact a health care provider if:  You have symptoms at your injection site that last longer than two days after your procedure.  You have redness, swelling, or pain in your injection area.  You have fluid, blood, or pus coming from your injection site.  You have warmth in your injection area.  You have a fever.  Your pain is not controlled with medicine. Get help right away if:  Your knee turns very red.  Your knee becomes very swollen.  Your knee pain is severe. This information is not intended to replace advice given to you by your health care provider. Make sure you discuss any questions you have with your health care provider. Document Released: 10/11/2014 Document Revised: 05/26/2016 Document Reviewed: 07/31/2014 Elsevier Interactive Patient Education  [employer].

## 2018-07-28 MED ORDER — LIDOCAINE-EPINEPHRINE 1 %-1:100000 IJ SOLN
4.0000 mL | Freq: Once | INTRAMUSCULAR | Status: AC
  Administered 2018-07-27: 4 mL

## 2018-07-28 MED ORDER — TRIAMCINOLONE ACETONIDE 40 MG/ML IJ SUSP
40.0000 mg | Freq: Once | INTRAMUSCULAR | Status: AC
  Administered 2018-07-27: 40 mg via INTRAMUSCULAR

## 2018-07-29 DIAGNOSIS — F411 Generalized anxiety disorder: Secondary | ICD-10-CM | POA: Insufficient documentation

## 2018-07-29 HISTORY — DX: Generalized anxiety disorder: F41.1

## 2018-07-29 NOTE — Assessment & Plan Note (Addendum)
She is in constant pain despite having tramadol for use .   Steroid injection s given .

## 2018-07-29 NOTE — Assessment & Plan Note (Signed)
Informed consent for joint injections of bilateral kneeds  obtained.  Areas were cleaned with betadine.  Left knee was covered with a sterile drape while right knee was injected under the patella from a superomedial approach. Sub patellar  bursa  was injected with Kenalog and Xylocaine .   Procedure was tolerated well and identical procedure was done to the left knee using a superomedial approach.  She was advised to rest the knees  for 48 hours , apply ice packs every 6 hours for 15 minutes, advised to call if she develops signs of infection

## 2018-07-29 NOTE — Assessment & Plan Note (Signed)
She does not want to start a daily medication for anxiety. The risks and benefits of benzodiazepine use were discussed with patient today including excessive sedation leading to respiratory depression,  impaired thinking/driving, and addiction.  Patient was prescribved Buspirone to use instead.

## 2018-07-29 NOTE — Assessment & Plan Note (Signed)
Advised to take the metoprolol at night an d continue the other meds during the day

## 2018-08-08 ENCOUNTER — Encounter: Payer: Self-pay | Admitting: Internal Medicine

## 2018-08-23 ENCOUNTER — Other Ambulatory Visit: Payer: Self-pay | Admitting: Internal Medicine

## 2018-09-06 ENCOUNTER — Ambulatory Visit: Payer: Self-pay | Admitting: Internal Medicine

## 2018-10-03 DIAGNOSIS — H25013 Cortical age-related cataract, bilateral: Secondary | ICD-10-CM | POA: Diagnosis not present

## 2018-10-16 ENCOUNTER — Encounter

## 2018-10-16 ENCOUNTER — Ambulatory Visit: Payer: Self-pay | Admitting: Internal Medicine

## 2018-10-16 DIAGNOSIS — Z0289 Encounter for other administrative examinations: Secondary | ICD-10-CM

## 2018-10-19 ENCOUNTER — Encounter: Payer: Self-pay | Admitting: Internal Medicine

## 2018-10-19 ENCOUNTER — Ambulatory Visit (INDEPENDENT_AMBULATORY_CARE_PROVIDER_SITE_OTHER): Payer: Medicare HMO | Admitting: Internal Medicine

## 2018-10-19 DIAGNOSIS — I10 Essential (primary) hypertension: Secondary | ICD-10-CM | POA: Diagnosis not present

## 2018-10-19 DIAGNOSIS — M170 Bilateral primary osteoarthritis of knee: Secondary | ICD-10-CM

## 2018-10-19 DIAGNOSIS — E785 Hyperlipidemia, unspecified: Secondary | ICD-10-CM | POA: Diagnosis not present

## 2018-10-19 DIAGNOSIS — G3184 Mild cognitive impairment, so stated: Secondary | ICD-10-CM | POA: Diagnosis not present

## 2018-10-19 NOTE — Assessment & Plan Note (Signed)
Persistently elevated.  Patient took all 3 meds just before coming to office,  Will change Toprol XL to evening, and have patient return in 1 week for RN visit to check BP

## 2018-10-19 NOTE — Patient Instructions (Addendum)
Your blood pressure is TOO HIGH!    I want you to take your metoprolol at bedtime instead of in the morning.  Continue taking amlodipine and  Losartan in the morning   return for a RN Nurse visit after one week so we can check your blood pressure

## 2018-10-19 NOTE — Progress Notes (Signed)
Subjective:  Patient ID: Tiffany Benjamin, female    DOB: 04-Aug-1933  Age: 85 y.o. MRN: [id number]  CC: Diagnoses of Essential hypertension, Mild cognitive impairment with memory loss, Tricompartment osteoarthritis of both knees, and Hyperlipidemia, unspecified hyperlipidemia type were pertinent to this visit.  HPI Tiffany Benjamin presents for follow up on hypertension and  MCI with memory loss ,  She missed appt earlier in the week  Her BP is elevated today .It has been elevated every visit for the last 3 years excluding one visit  In  Sept 2019.  Medications reviewed:  She insists that she is compliant with taking amlodipine, losartan and toprol and is using a pill box .    MCI: she continues to defer  referral for  MRI and neurology  As recommended in September.  Has not had either   DJD knees: using tramadol twice weekly .  No other med. .  Has deferred surgery . Not using a cane   Outpatient Medications Prior to Visit  Medication Sig Dispense Refill  . amLODipine (NORVASC) 10 MG tablet TAKE 1 TABLET BY MOUTH EVERY DAY 90 tablet 1  . Ascorbic Acid (VITAMIN C PO) Take 1 tablet by mouth daily.    Marland Kitchen aspirin 81 MG tablet Take 81 mg by mouth daily.    . busPIRone (BUSPAR) 10 MG tablet TAKE 1 TABLET BY MOUTH THREE TIMES A DAY 270 tablet 0  . losartan (COZAAR) 100 MG tablet TAKE 1 TABLET BY MOUTH EVERY DAY 90 tablet 1  . metoprolol succinate (TOPROL-XL) 50 MG 24 hr tablet TAKE 1 TABLET BY MOUTH DAILY TAKE WITH OR IMMEDIATELY FOLLOWING A MEAL. 90 tablet 1  . Omega 3 1000 MG CAPS 1 capsule twice daily with meals 360 each 3  . simvastatin (ZOCOR) 20 MG tablet TAKE 1 TABLET (20 MG TOTAL) BY MOUTH EVERY EVENING. 90 tablet 1  . traMADol (ULTRAM) 50 MG tablet Take 1 tablet (50 mg total) by mouth every 8 (eight) hours as needed. 90 tablet 0  . VITAMIN D, CHOLECALCIFEROL, PO Take 5,000 mg by mouth daily.    Marland Kitchen VITAMIN E PO Take 1 capsule by mouth daily.     Facility-Administered Medications Prior  to Visit  Medication Dose Route Frequency Provider Last Rate Last Dose  . cloNIDine (CATAPRES) tablet 0.1 mg  0.1 mg Oral Once Sherlene Shams, MD        Review of Systems;  Patient denies headache, fevers, malaise, unintentional weight loss, skin rash, eye pain, sinus congestion and sinus pain, sore throat, dysphagia,  hemoptysis , cough, dyspnea, wheezing, chest pain, palpitations, orthopnea, edema, abdominal pain, nausea, melena, diarrhea, constipation, flank pain, dysuria, hematuria, urinary  Frequency, nocturia, numbness, tingling, seizures,  Focal weakness, Loss of consciousness,  Tremor, insomnia, depression, anxiety, and suicidal ideation.      Objective:  BP (!) 172/82 (BP Location: Left Arm, Patient Position: Sitting, Cuff Size: Normal)   Pulse 65   Temp 98.2 F (36.8 C) (Oral)   Resp 15   Ht 5' 1.5" (1.562 m)   Wt 172 lb 12.8 oz (78.4 kg)   SpO2 98%   BMI 32.12 kg/m   BP Readings from Last 3 Encounters:  10/19/18 (!) 172/82  07/27/18 (!) 180/96  06/07/18 (!) 155/100    Wt Readings from Last 3 Encounters:  10/19/18 172 lb 12.8 oz (78.4 kg)  07/27/18 174 lb (78.9 kg)  06/07/18 172 lb (78 kg)    General appearance: alert, cooperative  and appears stated age Ears: normal TM's and external ear canals both ears Throat: lips, mucosa, and tongue normal; teeth and gums normal Neck: no adenopathy, no carotid bruit, supple, symmetrical, trachea midline and thyroid not enlarged, symmetric, no tenderness/mass/nodules Back: symmetric, no curvature. ROM normal. No CVA tenderness. Lungs: clear to auscultation bilaterally Heart: regular rate and rhythm, S1, S2 normal, no murmur, click, rub or gallop Abdomen: soft, non-tender; bowel sounds normal; no masses,  no organomegaly Pulses: 2+ and symmetric Skin: Skin color, texture, turgor normal. No rashes or lesions Lymph nodes: Cervical, supraclavicular, and axillary nodes normal.  Lab Results  Component Value Date   HGBA1C 5.7  06/02/2018   HGBA1C 5.3 06/17/2017    Lab Results  Component Value Date   CREATININE 1.23 (H) 06/07/2018   CREATININE 1.10 06/02/2018   CREATININE 1.08 06/15/2017    Lab Results  Component Value Date   WBC 6.1 06/02/2018   HGB 12.4 06/02/2018   HCT 37.1 06/02/2018   PLT 310.0 06/02/2018   GLUCOSE 91 06/07/2018   CHOL 175 06/07/2018   TRIG 146.0 06/07/2018   HDL 79.60 06/07/2018   LDLDIRECT 81.0 12/09/2016   LDLCALC 66 06/07/2018   ALT 15 06/07/2018   AST 18 06/07/2018   NA 143 06/07/2018   K 4.3 06/07/2018   CL 106 06/07/2018   CREATININE 1.23 (H) 06/07/2018   BUN 21 06/07/2018   CO2 30 06/07/2018   TSH 1.06 06/07/2018   HGBA1C 5.7 06/02/2018   MICROALBUR 10.3 (H) 06/02/2018     Assessment & Plan:   Problem List Items Addressed This Visit    Hyperlipidemia    Managed with simvastatin.  she has no side effects and liver enzymes have been normal  . No changes today  Lab Results  Component Value Date   CHOL 175 06/07/2018   HDL 79.60 06/07/2018   LDLCALC 66 06/07/2018   LDLDIRECT 81.0 12/09/2016   TRIG 146.0 06/07/2018   CHOLHDL 2 06/07/2018   Lab Results  Component Value Date   ALT 15 06/07/2018   AST 18 06/07/2018   ALKPHOS 80 06/07/2018   BILITOT 0.4 06/07/2018   Lab Results  Component Value Date   LDLCALC 66 06/07/2018              Hypertension    Persistently elevated.  Patient took all 3 meds just before coming to office,  Will change Toprol XL to evening, and have patient return in 1 week for RN visit to check BP        Mild cognitive impairment with memory loss    Screening labs normal..  Referral for MRI and Neurology evaluation  Advised but deferred.  She is living with her son and daughter in law.       Tricompartment Severe Knee OA, Bilateral    Her persistent  pain is managed with tramadol and periodic I/A steroid injections  By me.  She refuses to consider surgery or Orthopedics procedure.         I am having Jacara M.  Canniff maintain her aspirin, Omega 3, traMADol, (VITAMIN D, CHOLECALCIFEROL, PO), Ascorbic Acid (VITAMIN C PO), VITAMIN E PO, losartan, amLODipine, simvastatin, metoprolol succinate, and busPIRone. We will continue to administer cloNIDine.  No orders of the defined types were placed in this encounter.   There are no discontinued medications.  Follow-up: Return in about 6 months (around 04/19/2019).   Sherlene Shams, MD

## 2018-10-21 NOTE — Assessment & Plan Note (Signed)
Screening labs normal..  Referral for MRI and Neurology evaluation  Advised but deferred.  She is living with her son and daughter in law.

## 2018-10-21 NOTE — Assessment & Plan Note (Signed)
Managed with simvastatin.  she has no side effects and liver enzymes have been normal  . No changes today  Lab Results  Component Value Date   CHOL 175 06/07/2018   HDL 79.60 06/07/2018   LDLCALC 66 06/07/2018   LDLDIRECT 81.0 12/09/2016   TRIG 146.0 06/07/2018   CHOLHDL 2 06/07/2018   Lab Results  Component Value Date   ALT 15 06/07/2018   AST 18 06/07/2018   ALKPHOS 80 06/07/2018   BILITOT 0.4 06/07/2018   Lab Results  Component Value Date   LDLCALC 66 06/07/2018

## 2018-10-21 NOTE — Assessment & Plan Note (Addendum)
Her persistent  pain is managed with tramadol and periodic I/A steroid injections  By me.  She refuses to consider surgery or Orthopedics procedure.

## 2018-10-26 ENCOUNTER — Ambulatory Visit (INDEPENDENT_AMBULATORY_CARE_PROVIDER_SITE_OTHER): Payer: Medicare HMO

## 2018-10-26 DIAGNOSIS — I10 Essential (primary) hypertension: Secondary | ICD-10-CM | POA: Diagnosis not present

## 2018-10-26 NOTE — Progress Notes (Addendum)
Patient is here for a BP check due to bp being high at last visit, as per patient. Currently patients BP is 148/80 and BPM is 58.  Ten minutes later the BP is 126/62 and BPM 50. Patient has no complaints of headaches, blurry vision, chest pain, arm pain, light headedness, dizziness, and nor jaw pain.    I have reviewed the above information and agree with above.   Duncan Dull, MD

## 2018-11-19 ENCOUNTER — Other Ambulatory Visit: Payer: Self-pay | Admitting: Internal Medicine

## 2018-12-26 ENCOUNTER — Other Ambulatory Visit: Payer: Self-pay | Admitting: Internal Medicine

## 2019-01-09 ENCOUNTER — Other Ambulatory Visit: Payer: Self-pay | Admitting: Internal Medicine

## 2019-05-29 ENCOUNTER — Other Ambulatory Visit: Payer: Self-pay | Admitting: Internal Medicine

## 2019-06-01 ENCOUNTER — Other Ambulatory Visit: Payer: Self-pay | Admitting: Internal Medicine

## 2019-06-06 ENCOUNTER — Ambulatory Visit: Payer: Self-pay | Admitting: Internal Medicine

## 2019-06-06 ENCOUNTER — Ambulatory Visit: Payer: Self-pay

## 2019-06-08 ENCOUNTER — Telehealth: Payer: Self-pay | Admitting: *Deleted

## 2019-06-08 NOTE — Telephone Encounter (Signed)
Do not see where any one tried to call this pt today.

## 2019-06-08 NOTE — Telephone Encounter (Signed)
Copied from [place] [phone]. Topic: General - Other >> Jun 08, 2019 12:33 PM Leward Quan A wrote: Reason for CRM: Patient called to say that she had a missed call this morning but there was no message she say that she can be reached at Ph#  [phone]

## 2019-06-12 ENCOUNTER — Encounter: Payer: Self-pay | Admitting: Internal Medicine

## 2019-06-13 ENCOUNTER — Other Ambulatory Visit: Payer: Self-pay

## 2019-06-13 ENCOUNTER — Encounter: Payer: Self-pay | Admitting: Internal Medicine

## 2019-06-13 ENCOUNTER — Ambulatory Visit (INDEPENDENT_AMBULATORY_CARE_PROVIDER_SITE_OTHER): Payer: Medicare HMO

## 2019-06-13 DIAGNOSIS — Z0000 Encounter for general adult medical examination without abnormal findings: Secondary | ICD-10-CM | POA: Diagnosis not present

## 2019-06-13 NOTE — Patient Instructions (Addendum)
  Tiffany Benjamin , Thank you for taking time to come for your Medicare Wellness Visit. I appreciate your ongoing commitment to your health goals. Please review the following plan we discussed and let me know if I can assist you in the future.   These are the goals we discussed: Goals    . LIFESTYLE - DECREASE FALLS RISK     Do not run       This is a list of the screening recommended for you and due dates:  Health Maintenance  Topic Date Due  . Flu Shot  01/02/2020*  . DEXA scan (bone density measurement)  06/12/2020*  . Tetanus Vaccine  06/12/2020*  . Pneumonia vaccines  Completed  *Topic was postponed. The date shown is not the original due date.

## 2019-06-13 NOTE — Progress Notes (Addendum)
Subjective:   Tiffany Benjamin is a 85 y.o. female who presents for Medicare Annual (Subsequent) preventive examination.  Review of Systems:  No ROS.  Medicare Wellness Virtual Visit.  Visual/audio telehealth visit, UTA vital signs.   See social history for additional risk factors.   Cardiac Risk Factors include: advanced age (>5255men, 83>65 women);hypertension     Objective:     Vitals: There were no vitals taken for this visit.  There is no height or weight on file to calculate BMI.  Advanced Directives 06/13/2019 06/02/2018 03/31/2017 03/10/2016  Does Patient Have a Medical Advance Directive? No Yes Yes Yes  Type of Advance Directive - Healthcare Power of [place];Living will Healthcare Power of [place] Healthcare Power of [place];Living will  Does patient want to make changes to medical advance directive? - No - Patient declined No - Patient declined -  Copy of Healthcare Power of Attorney in Chart? - No - copy requested No - copy requested No - copy requested  Would patient like information on creating a medical advance directive? No - Patient declined - - -    Tobacco Social History   Tobacco Use  Smoking Status Never Smoker  Smokeless Tobacco Never Used     Counseling given: Not Answered   Clinical Intake:  Pre-visit preparation completed: Yes        Diabetes: No  How often do you need to have someone help you when you read instructions, pamphlets, or other written materials from your doctor or pharmacy?: 1 - Never  Interpreter Needed?: No     Past Medical History:  Diagnosis Date   Arthritis    Depression    Family history of polyps in the colon    History of chicken pox    History of colonic polyps    Hyperlipidemia    Hypertension    Ulcer    UTI (urinary tract infection)    Past Surgical History:  Procedure Laterality Date   BREAST SURGERY Right 2001   bernign lumpectomy    Family History  Problem Relation Age of Onset   Stroke  Mother [id number]       cerebral hemorrhage   Hypertension Mother    Diabetes Mother    Heart disease Father    Heart attack Father 162   Cancer Sister        pancreatic   Cancer Sister [id number]       retroperitoneal Ca removed, doing fine    Hyperlipidemia Son    Bone cancer Son    Diabetes Son    Social History   Socioeconomic History   Marital status: Widowed    Spouse name: Not on file   Number of children: Not on file   Years of education: Not on file   Highest education level: Not on file  Occupational History   Not on file  Social Needs   Financial resource strain: Not hard at all   Food insecurity    Worry: Never true    Inability: Never true   Transportation needs    Medical: No    Non-medical: No  Tobacco Use   Smoking status: Never Smoker   Smokeless tobacco: Never Used  Substance and Sexual Activity   Alcohol use: Yes    Alcohol/week: 3.0 standard drinks    Types: 3 Glasses of wine per week    Comment: occasional   Drug use: No   Sexual activity: Never  Lifestyle   Physical activity  Days per week: 5 days    Minutes per session: 30 min   Stress: Only a little  Relationships   Event organiser on phone: Not on file    Gets together: Not on file    Attends religious service: Not on file    Active member of club or organization: Not on file    Attends meetings of clubs or organizations: Not on file    Relationship status: Not on file  Other Topics Concern   Not on file  Social History Narrative   Social:  Her husband died about 20 years ago from throat cancer.  She then cared for her father for  20 yrs in her home before he passed.    She is retired 20 yrs from [employer]. 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 Likes to stay busy.  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   Exercises regularly, goes to [employer] 3 days a week and stretches before hand.          Outpatient Encounter Medications as of  06/13/2019  Medication Sig   amLODipine (NORVASC) 10 MG tablet TAKE 1 TABLET BY MOUTH EVERY DAY   Ascorbic Acid (VITAMIN C PO) Take 1 tablet by mouth daily.   aspirin 81 MG tablet Take 81 mg by mouth daily.   busPIRone (BUSPAR) 10 MG tablet TAKE 1 TABLET BY MOUTH THREE TIMES A DAY   losartan (COZAAR) 100 MG tablet TAKE 1 TABLET BY MOUTH EVERY DAY   metoprolol succinate (TOPROL-XL) 50 MG 24 hr tablet TAKE 1 TABLET BY MOUTH DAILY TAKE WITH OR IMMEDIATELY FOLLOWING A MEAL.   Omega 3 1000 MG CAPS 1 capsule twice daily with meals   simvastatin (ZOCOR) 20 MG tablet TAKE 1 TABLET (20 MG TOTAL) BY MOUTH EVERY EVENING.   traMADol (ULTRAM) 50 MG tablet Take 1 tablet (50 mg total) by mouth every 8 (eight) hours as needed.   VITAMIN D, CHOLECALCIFEROL, PO Take 5,000 mg by mouth daily.   VITAMIN E PO Take 1 capsule by mouth daily.   Facility-Administered Encounter Medications as of 06/13/2019  Medication   cloNIDine (CATAPRES) tablet 0.1 mg    Activities of Daily Living In your present state of health, do you have any difficulty performing the following activities: 06/13/2019  Hearing? N  Vision? N  Difficulty concentrating or making decisions? N  Walking or climbing stairs? N  Dressing or bathing? N  Doing errands, shopping? Y  Comment Limited. She does not like to travel off route or alone.  Preparing Food and eating ? N  Using the Toilet? N  In the past six months, have you accidently leaked urine? N  Do you have problems with loss of bowel control? N  Managing your Medications? N  Managing your Finances? N  Housekeeping or managing your Housekeeping? N  Some recent data might be hidden    Patient Care Team: Sherlene Shams, MD as PCP - General (Internal Medicine) Sherlene Shams, MD (Internal Medicine)    Assessment:   This is a routine wellness examination for Hann[place]e.  I connected with patient 06/13/19 at 10:30 AM EDT by an audio enabled telemedicine application and  verified that I am speaking with the correct person using two identifiers. Patient stated full name and DOB. Patient gave permission to continue with virtual visit. Patient's location was at home and Nurse's location was at [place] office.   Health  Maintenance Due: Influenza vaccine 2020- discussed; to be completed in season with doctor or local pharmacy.   Tdap- discussed; to be completed with doctor in visit or local pharmacy. Dexa Scan- declined Update all pending maintenance due as appropriate.   See completed HM at the end of note.   Eye: Visual acuity not assessed. Virtual visit. Wears corrective lenses. Followed by their ophthalmologist every 12 months.   Dental: Visits every 12 months.    Hearing: Demonstrates normal hearing during visit.  Safety:  Patient feels safe at home- yes Patient does have smoke detectors at home- yes Patient does wear sunscreen or protective clothing when in direct sunlight - yes Patient does wear seat belt when in a moving vehicle - yes Patient drives- yes; limited route Adequate lighting in walkways free from debris- yes Grab bars and handrails used as appropriate- yes Ambulates with no assistive device Cell phone on person when ambulating outside of the home- yes  Social: Alcohol intake - yes      Smoking history- never   Smokers in home? none Illicit drug use? none  Depression: PHQ 2 &9 complete. See screening below. Denies irritability, anhedonia, sadness/tearfullness.  Stable.   Falls: See screening below.    Medication: Taking as directed and without issues.   Covid-19: Precautions and sickness symptoms discussed. Wears mask, social distancing, hand hygiene as appropriate.   Activities of Daily Living Patient denies needing assistance with: household chores, feeding themselves, getting from bed to chair, getting to the toilet, bathing/showering, dressing, managing money, or preparing meals.   Memory: Patient is alert. Patient  denies difficulty focusing or concentrating. Correctly identified the president of the [place], season and recall. Patient likes to read for brain stimulation.  BMI- discussed the importance of a healthy diet, water intake and the benefits of aerobic exercise.  Educational material provided.  Physical activity- stationary bike and elliptical 5 days a week, 30 minutes  Diet:  Low sodium Water: good intake Caffeine:  Ensure/Protein supplement:  Advanced Directive: End of life planning; Advanced aging; Advanced directives discussed.  No HCPOA/Living Will.  Additional information declined at this time.  Other Providers Patient Care Team: Sherlene Shamsullo, Teresa L, MD as PCP - General (Internal Medicine) Sherlene Shamsullo, Teresa L, MD (Internal Medicine)  Exercise Activities and Dietary recommendations Current Exercise Habits: Home exercise routine, Type of exercise: calisthenics, Time (Minutes): 30, Frequency (Times/Week): 5, Weekly Exercise (Minutes/Week): 150, Intensity: Mild  Goals     Increase physical activity     Consider stationary bike or water aerobics for exercise with relief of knee pain     LIFESTYLE - DECREASE FALLS RISK     Do not run       Fall Risk Fall Risk  06/13/2019 06/02/2018 03/31/2017 03/10/2016 01/08/2015  Falls in the past year? 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 No injury.  - - -  Injury with Fall? - No - - -  Follow up - Falls prevention discussed;Education provided - - -   Timed Get Up and Go performed: no, virtual visit  Depression Screen PHQ 2/9 Scores 06/13/2019 06/02/2018 03/31/2017 03/10/2016  PHQ - 2 Score 0 0 0 0  PHQ- 9 Score - - 0 -     Cognitive Function MMSE - Mini Mental State Exam 03/10/2016  Orientation to time 5  Orientation to Place 5  Registration 3  Attention/ Calculation 5  Recall 3  Language- name 2 objects 2  Language- repeat 1  Language- follow 3 step command 3    Language- read & follow direction 1  Write a sentence 1  Copy design 1  Total score 30     6CIT Screen 06/13/2019 06/02/2018 03/31/2017  What Year? 0 points 0 points 0 points  What month? 0 points 0 points 0 points  What time? 0 points 0 points 0 points  Count back from 20 0 points 0 points 0 points  Months in reverse 0 points 2 points 0 points  Repeat phrase - - 0 points  Total Score - - 0    Immunization History  Administered Date(s) Administered   Influenza, High Dose Seasonal PF 11/25/2015, 06/07/2018   Influenza,inj,Quad PF,6+ Mos 07/11/2013, 08/04/2016   Influenza-Unspecified 10/04/2012   Pneumococcal Conjugate-13 02/19/2015   Pneumococcal Polysaccharide-23 03/04/2017   Pneumococcal-Unspecified 10/05/2011   Td 10/05/2007   Zoster 07/01/2012   Screening Tests Health Maintenance  Topic Date Due   INFLUENZA VACCINE  01/02/2020 (Originally 05/05/2019)   DEXA SCAN  06/12/2020 (Originally 11/29/1999)   TETANUS/TDAP  06/12/2020 (Originally 10/04/2017)   PNA vac Low Risk Adult  Completed      Plan:    Keep all routine maintenance appointments.   Cpe 06/26/19 at 3:30  Medicare Attestation I have personally reviewed: The patient's medical and social history Their use of alcohol, tobacco or illicit drugs Their current medications and supplements The patient's functional ability including ADLs,fall risks, home safety risks, cognitive, and hearing and visual impairment Diet and physical activities Evidence for depression   In addition, I have reviewed and discussed with patient certain preventive protocols, quality metrics, and best practice recommendations. A written personalized care plan for preventive services as well as general preventive health recommendations were provided to patient via mail.     OBrien-Blaney, Starla Deller L, LPN  01/03/7407   I have reviewed the above information and agree with above.   Deborra Medina, MD

## 2019-06-22 ENCOUNTER — Other Ambulatory Visit: Payer: Self-pay

## 2019-06-26 ENCOUNTER — Encounter: Payer: Self-pay | Admitting: Internal Medicine

## 2019-06-26 ENCOUNTER — Other Ambulatory Visit: Payer: Self-pay

## 2019-06-26 ENCOUNTER — Ambulatory Visit (INDEPENDENT_AMBULATORY_CARE_PROVIDER_SITE_OTHER): Payer: Medicare HMO | Admitting: Internal Medicine

## 2019-06-26 VITALS — BP 160/90 | HR 70 | Temp 97.1°F | Resp 15 | Ht 61.5 in | Wt 185.2 lb

## 2019-06-26 DIAGNOSIS — Z1231 Encounter for screening mammogram for malignant neoplasm of breast: Secondary | ICD-10-CM | POA: Diagnosis not present

## 2019-06-26 DIAGNOSIS — M1990 Unspecified osteoarthritis, unspecified site: Secondary | ICD-10-CM | POA: Diagnosis not present

## 2019-06-26 DIAGNOSIS — M6281 Muscle weakness (generalized): Secondary | ICD-10-CM

## 2019-06-26 DIAGNOSIS — R5383 Other fatigue: Secondary | ICD-10-CM | POA: Diagnosis not present

## 2019-06-26 DIAGNOSIS — I10 Essential (primary) hypertension: Secondary | ICD-10-CM | POA: Diagnosis not present

## 2019-06-26 DIAGNOSIS — E785 Hyperlipidemia, unspecified: Secondary | ICD-10-CM | POA: Diagnosis not present

## 2019-06-26 DIAGNOSIS — Z0000 Encounter for general adult medical examination without abnormal findings: Secondary | ICD-10-CM

## 2019-06-26 DIAGNOSIS — Z23 Encounter for immunization: Secondary | ICD-10-CM

## 2019-06-26 DIAGNOSIS — R7301 Impaired fasting glucose: Secondary | ICD-10-CM | POA: Diagnosis not present

## 2019-06-26 HISTORY — DX: Muscle weakness (generalized): M62.81

## 2019-06-26 MED ORDER — HYDROCHLOROTHIAZIDE 25 MG PO TABS: 25.0000 mg | ORAL_TABLET | Freq: Every day | ORAL | 3 refills | Status: DC

## 2019-06-26 MED ORDER — LOSARTAN POTASSIUM 100 MG PO TABS: 100.0000 mg | ORAL_TABLET | Freq: Every day | ORAL | 1 refills | Status: DC

## 2019-06-26 MED ORDER — AMLODIPINE BESYLATE 10 MG PO TABS: 10.0000 mg | ORAL_TABLET | Freq: Every day | ORAL | 1 refills | Status: DC

## 2019-06-26 MED ORDER — METOPROLOL SUCCINATE ER 50 MG PO TB24: 50.0000 mg | ORAL_TABLET | Freq: Every day | ORAL | 1 refills | Status: DC

## 2019-06-26 NOTE — Patient Instructions (Addendum)
Start taking your metoprolol at bedtime with your buspar  STARTING TONIGHT TAKE 1/2 LOSARTAN TABLET AT BEDTIME  STARTING TOMORROW TAKE  THE FULL LOSARTAN AT BEDTIME ONLY  CONTINUE AMLODIPINE IN THE MORNING  Start HCTZ IN THE MORNING (FLUID PILL ,  ALSO LOWERS BLOOD PRESSURE)  IF YOUR BLOOD PRESSURE IS NOT 140/80 OR LESS AFTER A WEEK OF TAKING THESE 4 MEDICATIONS REGULARLY,  LET ME KNOW  YOU RECEIVED YOUR TDAP VACCINE TODAY  I HAVE ORDERED YOUR MAMMOGRAM .     Health Maintenance After Age 18 After age 49, you are at a higher risk for certain long-term diseases and infections as well as injuries from falls. Falls are a major cause of broken bones and head injuries in people who are older than age 89. Getting regular preventive care can help to keep you healthy and well. Preventive care includes getting regular testing and making lifestyle changes as recommended by your health care provider. Talk with your health care provider about:  Which screenings and tests you should have. A screening is a test that checks for a disease when you have no symptoms.  A diet and exercise plan that is right for you. What should I know about screenings and tests to prevent falls? Screening and testing are the best ways to find a health problem early. Early diagnosis and treatment give you the best chance of managing medical conditions that are common after age 16. Certain conditions and lifestyle choices may make you more likely to have a fall. Your health care provider may recommend:  Regular vision checks. Poor vision and conditions such as cataracts can make you more likely to have a fall. If you wear glasses, make sure to get your prescription updated if your vision changes.  Medicine review. Work with your health care provider to regularly review all of the medicines you are taking, including over-the-counter medicines. Ask your health care provider about any side effects that may make you more likely to  have a fall. Tell your health care provider if any medicines that you take make you feel dizzy or sleepy.  Osteoporosis screening. Osteoporosis is a condition that causes the bones to get weaker. This can make the bones weak and cause them to break more easily.  Blood pressure screening. Blood pressure changes and medicines to control blood pressure can make you feel dizzy.  Strength and balance checks. Your health care provider may recommend certain tests to check your strength and balance while standing, walking, or changing positions.  Foot health exam. Foot pain and numbness, as well as not wearing proper footwear, can make you more likely to have a fall.  Depression screening. You may be more likely to have a fall if you have a fear of falling, feel emotionally low, or feel unable to do activities that you used to do.  Alcohol use screening. Using too much alcohol can affect your balance and may make you more likely to have a fall. What actions can I take to lower my risk of falls? General instructions  Talk with your health care provider about your risks for falling. Tell your health care provider if: ? You fall. Be sure to tell your health care provider about all falls, even ones that seem minor. ? You feel dizzy, sleepy, or off-balance.  Take over-the-counter and prescription medicines only as told by your health care provider. These include any supplements.  Eat a healthy diet and maintain a healthy weight. A healthy diet includes  low-fat dairy products, low-fat (lean) meats, and fiber from whole grains, beans, and lots of fruits and vegetables. Home safety  Remove any tripping hazards, such as rugs, cords, and clutter.  Install safety equipment such as grab bars in bathrooms and safety rails on stairs.  Keep rooms and walkways well-lit. Activity   Follow a regular exercise program to stay fit. This will help you maintain your balance. Ask your health care provider what  types of exercise are appropriate for you.  If you need a cane or walker, use it as recommended by your health care provider.  Wear supportive shoes that have nonskid soles. Lifestyle  Do not drink alcohol if your health care provider tells you not to drink.  If you drink alcohol, limit how much you have: ? 0-1 drink a day for women. ? 0-2 drinks a day for men.  Be aware of how much alcohol is in your drink. In the [place] one drink equals one typical bottle of beer (12 oz), one-half glass of wine (5 oz), or one shot of hard liquor (1 oz).  Do not use any products that contain nicotine or tobacco, such as cigarettes and e-cigarettes. If you need help quitting, ask your health care provider. Summary  Having a healthy lifestyle and getting preventive care can help to protect your health and wellness after age 61.  Screening and testing are the best way to find a health problem early and help you avoid having a fall. Early diagnosis and treatment give you the best chance for managing medical conditions that are more common for people who are older than age 49.  Falls are a major cause of broken bones and head injuries in people who are older than age 61. Take precautions to prevent a fall at home.  Work with your health care provider to learn what changes you can make to improve your health and wellness and to prevent falls. This information is not intended to replace advice given to you by your health care provider. Make sure you discuss any questions you have with your health care provider. Document Released: 08/03/2017 Document Revised: 01/11/2019 Document Reviewed: 08/03/2017 Elsevier Patient Education  2020 [employer].

## 2019-06-26 NOTE — Assessment & Plan Note (Signed)

## 2019-06-26 NOTE — Assessment & Plan Note (Signed)
With development of proximal muscle weaknees.  Rolling walker and bathtub seat prescribed

## 2019-06-26 NOTE — Assessment & Plan Note (Signed)
She would benefit from a shower bench or stool to facilitate safe entrance and exiing the bathtub given her proximal muscle weakness

## 2019-06-26 NOTE — Assessment & Plan Note (Signed)
levated today.  meds reviewed,  Advised to change metoprolol and losartan to bedtime.  Continue amlodipine in the am and adding hctz

## 2019-06-26 NOTE — Progress Notes (Signed)
Patient ID: Tiffany Benjamin, female    DOB: 28-Apr-1933  Age: 85 y.o. MRN: [id number]  The patient is here for annual Medicare wellness examination and management of other chronic and acute problems.   The risk factors are reflected in the social history.  The roster of all physicians providing medical care to patient - is listed in the Snapshot section of the chart.  Activities of daily living:  The patient is 100% independent in all ADLs: dressing, toileting, feeding as well as independent mobility  Home safety : The patient has smoke detectors in the home. They wear seatbelts.  There are no firearms at home. There is no violence in the home.   There is no risks for hepatitis, STDs or HIV. There is no   history of blood transfusion. They have no travel history to infectious disease endemic areas of the world.  The patient has seen their dentist in the last six month. They have seen their eye doctor in the last year. They admit to slight hearing difficulty with regard to whispered voices and some television programs.  They have deferred audiologic testing in the last year.  They do not  have excessive sun exposure. Discussed the need for sun protection: hats, long sleeves and use of sunscreen if there is significant sun exposure.   Diet: the importance of a healthy diet is discussed. They do have a healthy diet.  The benefits of regular aerobic exercise were discussed. She walks 4 times per week ,  20 minutes.   Depression screen: there are no signs or vegative symptoms of depression- irritability, change in appetite, anhedonia, sadness/tearfullness.  Cognitive assessment: the patient manages all their financial and personal affairs and is actively engaged. They could relate day,date,year and events; recalled 2/3 objects at 3 minutes; performed clock-face test normally.  The following portions of the patient's history were reviewed and updated as appropriate: allergies, current medications,  past family history, past medical history,  past surgical history, past social history  and problem list.  Visual acuity was not assessed per patient preference since she has regular follow up with her ophthalmologist. Hearing and body mass index were assessed and reviewed.   During the course of the visit the patient was educated and counseled about appropriate screening and preventive services including : fall prevention , diabetes screening, nutrition counseling, colorectal cancer screening, and recommended immunizations.    CC: The primary encounter diagnosis was Encounter for preventive health examination. Diagnoses of Proximal muscle weakness, Encounter for screening mammogram for breast cancer, Essential hypertension, Hyperlipidemia, unspecified hyperlipidemia type, Fatigue, unspecified type, Impaired fasting glucose, Need for diphtheria-tetanus-pertussis (Tdap) vaccine, and Arthritis were also pertinent to this visit.  PROXIMAL MUSCLE WEAKNESS   HAVING early TROUBLE GETTING OUT OF THE TUB  NEEDS A SHOWER STOOL/BENCH.  Not checking blood pressures bc home machine has been broken for over 6 months    History Tiffany Benjamin has a past medical history of Arthritis, Depression, Family history of polyps in the colon, History of chicken pox, History of colonic polyps, Hyperlipidemia, Hypertension, Ulcer, and UTI (urinary tract infection).   She has a past surgical history that includes Breast surgery (Right, 2001).   Her family history includes Bone cancer in her son; Cancer in her sister; Cancer (age of onset: 57) in her sister; Diabetes in her mother and son; Heart attack (age of onset: 62) in her father; Heart disease in her father; Hyperlipidemia in her son; Hypertension in her mother; Stroke (age of onset:  52) in her mother.She reports that she has never smoked. She has never used smokeless tobacco. She reports current alcohol use of about 3.0 standard drinks of alcohol per week. She reports that she  does not use drugs.  Outpatient Medications Prior to Visit  Medication Sig Dispense Refill  . Ascorbic Acid (VITAMIN C PO) Take 1 tablet by mouth daily.    Marland Kitchen aspirin 81 MG tablet Take 81 mg by mouth daily.    . busPIRone (BUSPAR) 10 MG tablet TAKE 1 TABLET BY MOUTH THREE TIMES A DAY 270 tablet 1  . Omega 3 1000 MG CAPS 1 capsule twice daily with meals 360 each 3  . simvastatin (ZOCOR) 20 MG tablet TAKE 1 TABLET (20 MG TOTAL) BY MOUTH EVERY EVENING. 90 tablet 1  . traMADol (ULTRAM) 50 MG tablet Take 1 tablet (50 mg total) by mouth every 8 (eight) hours as needed. 90 tablet 0  . VITAMIN D, CHOLECALCIFEROL, PO Take 5,000 mg by mouth daily.    Marland Kitchen VITAMIN E PO Take 1 capsule by mouth daily.    Marland Kitchen amLODipine (NORVASC) 10 MG tablet TAKE 1 TABLET BY MOUTH EVERY DAY 90 tablet 1  . losartan (COZAAR) 100 MG tablet TAKE 1 TABLET BY MOUTH EVERY DAY 90 tablet 1  . metoprolol succinate (TOPROL-XL) 50 MG 24 hr tablet TAKE 1 TABLET BY MOUTH DAILY TAKE WITH OR IMMEDIATELY FOLLOWING A MEAL. 90 tablet 1   Facility-Administered Medications Prior to Visit  Medication Dose Route Frequency Provider Last Rate Last Dose  . cloNIDine (CATAPRES) tablet 0.1 mg  0.1 mg Oral Once Crecencio Mc, MD        Review of Systems   Patient denies headache, fevers, malaise, unintentional weight loss, skin rash, eye pain, sinus congestion and sinus pain, sore throat, dysphagia,  hemoptysis , cough, dyspnea, wheezing, chest pain, palpitations, orthopnea, edema, abdominal pain, nausea, melena, diarrhea, constipation, flank pain, dysuria, hematuria, urinary  Frequency, nocturia, numbness, tingling, seizures,  Focal weakness, Loss of consciousness,  Tremor, insomnia, depression, anxiety, and suicidal ideation.      Objective:  BP (!) 160/90 (BP Location: Left Arm, Patient Position: Sitting, Cuff Size: Normal)   Pulse 70   Temp (!) 97.1 F (36.2 C) (Temporal)   Resp 15   Ht 5' 1.5" (1.562 m)   Wt 185 lb 3.2 oz (84 kg)   SpO2  96%   BMI 34.43 kg/m   Physical Exam  General appearance: alert, cooperative and appears stated age Ears: normal TM's and external ear canals both ears Throat: lips, mucosa, and tongue normal; teeth and gums normal Neck: no adenopathy, no carotid bruit, supple, symmetrical, trachea midline and thyroid not enlarged, symmetric, no tenderness/mass/nodules Back: symmetric, no curvature. ROM normal. No CVA tenderness. Lungs: clear to auscultation bilaterally Heart: regular rate and rhythm, S1, S2 normal, no murmur, click, rub or gallop Abdomen: soft, non-tender; bowel sounds normal; no masses,  no organomegaly Pulses: 2+ and symmetric Skin: Skin color, texture, turgor normal. No rashes or lesions Lymph nodes: Cervical, supraclavicular, and axillary nodes normal.  Assessment & Plan:   Problem List Items Addressed This Visit      Unprioritized   Hyperlipidemia   Relevant Medications   hydrochlorothiazide (HYDRODIURIL) 25 MG tablet   amLODipine (NORVASC) 10 MG tablet   losartan (COZAAR) 100 MG tablet   metoprolol succinate (TOPROL-XL) 50 MG 24 hr tablet   Other Relevant Orders   Lipid panel   Arthritis    With development of proximal  muscle weaknees.  Rolling walker and bathtub seat prescribed       Hypertension    levated today.  meds reviewed,  Advised to change metoprolol and losartan to bedtime.  Continue amlodipine in the am and adding hctz      Relevant Medications   hydrochlorothiazide (HYDRODIURIL) 25 MG tablet   amLODipine (NORVASC) 10 MG tablet   losartan (COZAAR) 100 MG tablet   metoprolol succinate (TOPROL-XL) 50 MG 24 hr tablet   Other Relevant Orders   Comprehensive metabolic panel   Encounter for preventive health examination - Primary    age appropriate education and counseling updated, referrals for preventative services and immunizations addressed, dietary and smoking counseling addressed, most recent labs reviewed.  I have personally reviewed and have  noted:  1) the patient's medical and social history 2) The pt's use of alcohol, tobacco, and illicit drugs 3) The patient's current medications and supplements 4) Functional ability including ADL's, fall risk, home safety risk, hearing and visual impairment 5) Diet and physical activities 6) Evidence for depression or mood disorder 7) The patient's height, weight, and BMI have been recorded in the chart  I have made referrals, and provided counseling and education based on review of the above      Proximal muscle weakness    She would benefit from a shower bench or stool to facilitate safe entrance and exiing the bathtub given her proximal muscle weakness       Relevant Orders   For home use only DME Other see comment    Other Visit Diagnoses    Encounter for screening mammogram for breast cancer       Relevant Orders   MM DIGITAL SCREENING BILATERAL   Fatigue, unspecified type       Relevant Orders   TSH   CBC with Differential/Platelet   Impaired fasting glucose       Relevant Orders   Hemoglobin A1c   Need for diphtheria-tetanus-pertussis (Tdap) vaccine       Relevant Orders   Tdap vaccine greater than or equal to 7yo IM (Completed)      I have changed Tiffany Benjamin's amLODipine, losartan, and metoprolol succinate. I am also having her start on hydrochlorothiazide. Additionally, I am having her maintain her aspirin, Omega 3, traMADol, (VITAMIN D, CHOLECALCIFEROL, PO), Ascorbic Acid (VITAMIN C PO), VITAMIN E PO, simvastatin, and busPIRone. We will continue to administer cloNIDine.  Meds ordered this encounter  Medications  . hydrochlorothiazide (HYDRODIURIL) 25 MG tablet    Sig: Take 1 tablet (25 mg total) by mouth daily. In the morning for hypertension    Dispense:  90 tablet    Refill:  3  . amLODipine (NORVASC) 10 MG tablet    Sig: Take 1 tablet (10 mg total) by mouth daily. IN THE MORNING FOR HYPERTENSION    Dispense:  90 tablet    Refill:  1  . losartan  (COZAAR) 100 MG tablet    Sig: Take 1 tablet (100 mg total) by mouth daily. IN THE EVENING FOR HYPERTENSION    Dispense:  90 tablet    Refill:  1  . metoprolol succinate (TOPROL-XL) 50 MG 24 hr tablet    Sig: Take 1 tablet (50 mg total) by mouth at bedtime. FOR HYPERTENSION    Dispense:  90 tablet    Refill:  1    Medications Discontinued During This Encounter  Medication Reason  . amLODipine (NORVASC) 10 MG tablet   . metoprolol succinate (TOPROL-XL)  50 MG 24 hr tablet   . losartan (COZAAR) 100 MG tablet     Follow-up: No follow-ups on file.   Sherlene Shamseresa L Macklen Wilhoite, MD

## 2019-06-27 LAB — CBC WITH DIFFERENTIAL/PLATELET
Basophils Absolute: 0.1 10*3/uL (ref 0.0–0.1)
Basophils Relative: 1.1 % (ref 0.0–3.0)
Eosinophils Absolute: 0.2 10*3/uL (ref 0.0–0.7)
Eosinophils Relative: 2.2 % (ref 0.0–5.0)
HCT: 37.8 % (ref 36.0–46.0)
Hemoglobin: 12.7 g/dL (ref 12.0–15.0)
Lymphocytes Relative: 34.9 % (ref 12.0–46.0)
Lymphs Abs: 2.5 10*3/uL (ref 0.7–4.0)
MCHC: 33.5 g/dL (ref 30.0–36.0)
MCV: 96.7 fl (ref 78.0–100.0)
Monocytes Absolute: 0.7 10*3/uL (ref 0.1–1.0)
Monocytes Relative: 10.1 % (ref 3.0–12.0)
Neutro Abs: 3.7 10*3/uL (ref 1.4–7.7)
Neutrophils Relative %: 51.7 % (ref 43.0–77.0)
Platelets: 269 10*3/uL (ref 150.0–400.0)
RBC: 3.91 Mil/uL (ref 3.87–5.11)
RDW: 15.4 % (ref 11.5–15.5)
WBC: 7.2 10*3/uL (ref 4.0–10.5)

## 2019-06-27 LAB — HEMOGLOBIN A1C: Hgb A1c MFr Bld: 5.9 % (ref 4.6–6.5)

## 2019-06-27 LAB — COMPREHENSIVE METABOLIC PANEL
ALT: 15 U/L (ref 0–35)
AST: 21 U/L (ref 0–37)
Albumin: 4.2 g/dL (ref 3.5–5.2)
Alkaline Phosphatase: 71 U/L (ref 39–117)
BUN: 23 mg/dL (ref 6–23)
CO2: 27 mEq/L (ref 19–32)
Calcium: 9.5 mg/dL (ref 8.4–10.5)
Chloride: 101 mEq/L (ref 96–112)
Creatinine, Ser: 1.24 mg/dL — ABNORMAL HIGH (ref 0.40–1.20)
GFR: 49.8 mL/min — ABNORMAL LOW (ref >60.00)
Glucose, Bld: 85 mg/dL (ref 70–99)
Potassium: 4.1 mEq/L (ref 3.5–5.1)
Sodium: 139 mEq/L (ref 135–145)
Total Bilirubin: 0.5 mg/dL (ref 0.2–1.2)
Total Protein: 7.5 g/dL (ref 6.0–8.3)

## 2019-06-27 LAB — LIPID PANEL
Cholesterol: 189 mg/dL (ref 0–200)
HDL: 79 mg/dL (ref >39.00)
LDL Cholesterol: 92 mg/dL (ref 0–99)
NonHDL: 110.07
Total CHOL/HDL Ratio: 2
Triglycerides: 88 mg/dL (ref 0.0–149.0)
VLDL: 17.6 mg/dL (ref 0.0–40.0)

## 2019-06-27 LAB — TSH: TSH: 1.26 u[IU]/mL (ref 0.35–4.50)

## 2019-07-02 ENCOUNTER — Telehealth: Payer: Self-pay | Admitting: Internal Medicine

## 2019-07-02 NOTE — Telephone Encounter (Signed)
Not if the last contact was august 30 which is a month ago

## 2019-07-02 NOTE — Telephone Encounter (Signed)
Pt's daughter called to let Dr. Derrel Nip know that Tiffany Benjamin's sister passed away from [place]. Patient was with her on August 30th for a half day. Patient has no symptoms. Daughter is concerned for her  Mother even though she has no symptoms. Daughter also states that all her Aunt's family has tested positive for Covid. She also stated that she and her mother really do not go out in public much. Pt's daughter wanted to know she they get tested even though they have no covid symptoms.

## 2019-07-04 NOTE — Telephone Encounter (Signed)
Spoke with pt to let her know that if she has not been in contact with any of her family that has tested positive for covid-19 since 06/03/2019 then she does not need to be tested. Pt gave a verbal understanding.

## 2019-07-13 ENCOUNTER — Other Ambulatory Visit: Payer: Self-pay | Admitting: Internal Medicine

## 2019-08-20 ENCOUNTER — Telehealth: Payer: Self-pay | Admitting: *Deleted

## 2019-08-20 NOTE — Telephone Encounter (Signed)
Copied from [place] [phone]. Topic: Appointment Scheduling - Scheduling Inquiry for Clinic >> Aug 20, 2019 12:00 PM Parke Poisson wrote: Reason for CRM: Pt has an appointment with Dr Derrel Nip in Dec for knee pain but would like an appointment before that

## 2019-08-21 NOTE — Telephone Encounter (Signed)
Pt is having knee pain and couldn't get an appt scheduled with you until December. Pt would like to be seen sooner. Would it be okay to use a 4pm appt that is available?

## 2019-08-21 NOTE — Telephone Encounter (Signed)
Yes you can use  4 pm

## 2019-08-22 ENCOUNTER — Ambulatory Visit: Payer: Medicare HMO | Admitting: Internal Medicine

## 2019-08-22 ENCOUNTER — Other Ambulatory Visit: Payer: Self-pay

## 2019-08-22 NOTE — Telephone Encounter (Signed)
Pt has been scheduled for 09/05/2019

## 2019-08-22 NOTE — Telephone Encounter (Signed)
LMTCB

## 2019-08-24 ENCOUNTER — Telehealth: Payer: Self-pay | Admitting: Internal Medicine

## 2019-08-24 NOTE — Telephone Encounter (Signed)
Patient requesting all medications please send a refill to  [id number] - [place], [place] Informed patient please allow 48 to 72 hour turn around time

## 2019-08-24 NOTE — Telephone Encounter (Signed)
Spoke with pt to let her know that she has refills at the pharmacy for all of her medications and that she will just need to call them and tell them she needs them refilled. Pt gave a verbal understanding.

## 2019-09-03 ENCOUNTER — Telehealth: Payer: Self-pay | Admitting: *Deleted

## 2019-09-03 NOTE — Telephone Encounter (Signed)
Copied from [place] [phone]. Topic: General - Other >> Sep 03, 2019  2:27 PM Leward Quan A wrote: Reason for CRM: Patient called in reference to her appointment for 4.00 PM on 09/05/2019. She was upset because she said someone told her that her appointment was cancelled. I informed her that she had an appointment on 10/02/2019 that was cancelled but that she had a phone visit scheduled for 09/05/2019 4.00 PM. Can someone please call the patient to confirm with her about this appointment. Ph# [phone]

## 2019-09-05 ENCOUNTER — Encounter: Payer: Self-pay | Admitting: Internal Medicine

## 2019-09-05 ENCOUNTER — Ambulatory Visit (INDEPENDENT_AMBULATORY_CARE_PROVIDER_SITE_OTHER): Payer: Medicare HMO | Admitting: Internal Medicine

## 2019-09-05 DIAGNOSIS — M25562 Pain in left knee: Secondary | ICD-10-CM

## 2019-09-05 DIAGNOSIS — I10 Essential (primary) hypertension: Secondary | ICD-10-CM | POA: Diagnosis not present

## 2019-09-05 DIAGNOSIS — M25561 Pain in right knee: Secondary | ICD-10-CM | POA: Diagnosis not present

## 2019-09-05 DIAGNOSIS — G8929 Other chronic pain: Secondary | ICD-10-CM

## 2019-09-05 MED ORDER — TRAMADOL HCL 50 MG PO TABS: 50.0000 mg | ORAL_TABLET | Freq: Four times a day (QID) | ORAL | 4 refills | Status: DC | PRN

## 2019-09-05 MED ORDER — HYDROCHLOROTHIAZIDE 25 MG PO TABS: 25.0000 mg | ORAL_TABLET | Freq: Every day | ORAL | 3 refills | Status: DC

## 2019-09-05 NOTE — Progress Notes (Signed)
Telephone  Note  This visit type was conducted due to national recommendations for restrictions regarding the COVID-19 pandemic (e.g. social distancing).  This format is felt to be most appropriate for this patient at this time.  All issues noted in this document were discussed and addressed.  No physical exam was performed (except for noted visual exam findings with Video Visits).   I connected with @ on 09/05/19 at  4:00 PM EST by Telephone  and verified that I am speaking with the correct person using two identifiers. Location patient: home Location provider: work or home office Persons participating in the virtual visit: patient, provider  I discussed the limitations, risks, security and privacy concerns of performing an evaluation and management service by telephone and the availability of in person appointments. I also discussed with the patient that there may be a patient responsible charge related to this service. The patient expressed understanding and agreed to proceed.   Reason for visit:  Follow up on bilateral knee pain, hypertension    HPI:  85 yr old with uncontrolled hypertension ,  Prediabetes and  bilateral knee pain secondary to DJD , last steroid injection Oct 2019 (her 2nd )   Knee pain : Using Tylenol  But has not been using the tramadol for unclear reasons.  Pain is aggravated by walking and standing.  Denies swelling and redness. Has not had any recent falls     HTN:  Not checking BP ("machine is broken" 0.  HER Last known  reading in September was again  elevated .  meds reviewed . Does not appear to be  taking hctz   ROS: Patient denies headache, fevers, malaise, unintentional weight loss, skin rash, eye pain, sinus congestion and sinus pain, sore throat, dysphagia,  hemoptysis , cough, dyspnea, wheezing, chest pain, palpitations, orthopnea, edema, abdominal pain, nausea, melena, diarrhea, constipation, flank pain, dysuria, hematuria, urinary  Frequency, nocturia,  numbness, tingling, seizures,  Focal weakness, Loss of consciousness,  Tremor, insomnia, depression, anxiety, and suicidal ideation.     Past Medical History:  Diagnosis Date  . Arthritis   . Depression   . Family history of polyps in the colon   . History of chicken pox   . History of colonic polyps   . Hyperlipidemia   . Hypertension   . Ulcer   . UTI (urinary tract infection)     Past Surgical History:  Procedure Laterality Date  . BREAST SURGERY Right 2001   bernign lumpectomy     Family History  Problem Relation Age of Onset  . Stroke Mother 97       cerebral hemorrhage  . Hypertension Mother   . Diabetes Mother   . Heart disease Father   . Heart attack Father 65  . Cancer Sister        pancreatic  . Cancer Sister 64       retroperitoneal Ca removed, doing fine   . Hyperlipidemia Son   . Bone cancer Son   . Diabetes Son     SOCIAL HX:  reports that she has never smoked. She has never used smokeless tobacco. She reports current alcohol use of about 3.0 standard drinks of alcohol per week. She reports that she does not use drugs.   Current Outpatient Medications:  .  amLODipine (NORVASC) 10 MG tablet, Take 1 tablet (10 mg total) by mouth daily. IN THE MORNING FOR HYPERTENSION, Disp: 90 tablet, Rfl: 1 .  Ascorbic Acid (VITAMIN C PO), Take 1  tablet by mouth daily., Disp: , Rfl:  .  aspirin 81 MG tablet, Take 81 mg by mouth daily., Disp: , Rfl:  .  busPIRone (BUSPAR) 10 MG tablet, TAKE 1 TABLET BY MOUTH THREE TIMES A DAY, Disp: 270 tablet, Rfl: 1 .  hydrochlorothiazide (HYDRODIURIL) 25 MG tablet, Take 1 tablet (25 mg total) by mouth daily. In the morning for hypertension, Disp: 90 tablet, Rfl: 3 .  losartan (COZAAR) 100 MG tablet, Take 1 tablet (100 mg total) by mouth daily. IN THE EVENING FOR HYPERTENSION, Disp: 90 tablet, Rfl: 1 .  metoprolol succinate (TOPROL-XL) 50 MG 24 hr tablet, Take 1 tablet (50 mg total) by mouth at bedtime. FOR HYPERTENSION, Disp: 90 tablet,  Rfl: 1 .  Omega 3 1000 MG CAPS, 1 capsule twice daily with meals, Disp: 360 each, Rfl: 3 .  simvastatin (ZOCOR) 20 MG tablet, TAKE 1 TABLET BY MOUTH EVERY DAY IN THE EVENING, Disp: 90 tablet, Rfl: 1 .  traMADol (ULTRAM) 50 MG tablet, Take 1 tablet (50 mg total) by mouth every 6 (six) hours as needed. Maximum 4 tablets daily, Disp: 120 tablet, Rfl: 4 .  VITAMIN D, CHOLECALCIFEROL, PO, Take 5,000 mg by mouth daily., Disp: , Rfl:  .  VITAMIN E PO, Take 1 capsule by mouth daily., Disp: , Rfl:   Current Facility-Administered Medications:  .  cloNIDine (CATAPRES) tablet 0.1 mg, 0.1 mg, Oral, Once, Sherlene Shams, MD  EXAM:   General impression: alert, cooperative and articulate.  No signs of being in distress  Lungs: speech is fluent sentence length suggests that patient is not short of breath and not punctuated by cough, sneezing or sniffing. Marland Kitchen   Psych: affect normal.  speech is articulate and non pressured .  Denies suicidal thoughts   ASSESSMENT AND PLAN:  Discussed the following assessment and plan:  Essential hypertension  Chronic pain of both knees  Hypertension Not well controlled historically ,  suspect medication adherence is an issues.  Not taking hctz for unclear reasons . Refilling today..  Continue amlodipine , metoprolol, losartan .  Attempts to rule out secondary causes have been made repeatedly without success   Knee pain, bilateral  Her persistent  pain is managed with tramadol and periodic I/A steroid injections  By me.  She refuses to consider surgery or Orthopedics procedure. Tramadol refilled today (not taking, last refill was not even registering at the [facility] Controlled subsatance database) .  If pain is not controlled with tramadol and tylenol, return for I/A steroid injections (last one Jan 2019)    I discussed the assessment and treatment plan with the patient. The patient was provided an opportunity to ask questions and all were answered. The patient agreed with the  plan and demonstrated an understanding of the instructions.   The patient was advised to call back or seek an in-person evaluation if the symptoms worsen or if the condition fails to improve as anticipated.  I provided  25 minutes of non-face-to-face time during this encounter reviewing patient's current problems and post surgeries.  Providing counseling on the above mentioned problems , and coordination  of care .   Sherlene Shams, MD

## 2019-09-05 NOTE — Assessment & Plan Note (Signed)
Her persistent  pain is managed with tramadol and periodic I/A steroid injections  By me.  She refuses to consider surgery or Orthopedics procedure. Tramadol refilled today (not taking, last refill was not even registering at the [facility]) .  If pain is not controlled with tramadol and tylenol, return for I/A steroid injections (last one Jan 2019)

## 2019-09-05 NOTE — Assessment & Plan Note (Addendum)
Not well controlled historically ,  suspect medication adherence is an issues.  Not taking hctz for unclear reasons . Refilling today..  Continue amlodipine , metoprolol, losartan .  Attempts to rule out secondary causes have been made repeatedly without success

## 2019-10-02 ENCOUNTER — Ambulatory Visit: Payer: Medicare HMO | Admitting: Internal Medicine

## 2019-10-30 DIAGNOSIS — H2513 Age-related nuclear cataract, bilateral: Secondary | ICD-10-CM | POA: Diagnosis not present

## 2019-11-21 DIAGNOSIS — H2512 Age-related nuclear cataract, left eye: Secondary | ICD-10-CM | POA: Diagnosis not present

## 2019-11-21 DIAGNOSIS — I10 Essential (primary) hypertension: Secondary | ICD-10-CM | POA: Diagnosis not present

## 2019-12-01 ENCOUNTER — Other Ambulatory Visit: Payer: Self-pay | Admitting: Internal Medicine

## 2019-12-04 ENCOUNTER — Encounter: Payer: Self-pay | Admitting: Ophthalmology

## 2019-12-04 ENCOUNTER — Other Ambulatory Visit: Payer: Self-pay

## 2019-12-06 ENCOUNTER — Other Ambulatory Visit
Admission: RE | Admit: 2019-12-06 | Discharge: 2019-12-06 | Disposition: A | Discharge status: Home / Self Care | Payer: Medicare HMO | Source: Ambulatory Visit | Attending: Ophthalmology | Admitting: Ophthalmology

## 2019-12-06 ENCOUNTER — Other Ambulatory Visit: Payer: Self-pay

## 2019-12-06 DIAGNOSIS — Z20822 Contact with and (suspected) exposure to covid-19: Secondary | ICD-10-CM | POA: Insufficient documentation

## 2019-12-06 DIAGNOSIS — Z01812 Encounter for preprocedural laboratory examination: Secondary | ICD-10-CM | POA: Insufficient documentation

## 2019-12-06 LAB — SARS CORONAVIRUS 2 (TAT 6-24 HRS): SARS Coronavirus 2: NEGATIVE

## 2019-12-07 NOTE — Discharge Instructions (Signed)

## 2019-12-10 ENCOUNTER — Ambulatory Visit
Admission: RE | Admit: 2019-12-10 | Discharge: 2019-12-10 | Disposition: A | Discharge status: Home / Self Care | Payer: Medicare HMO | Attending: Ophthalmology | Admitting: Ophthalmology

## 2019-12-10 ENCOUNTER — Other Ambulatory Visit: Payer: Self-pay

## 2019-12-10 ENCOUNTER — Encounter
Admission: RE | Disposition: A | Discharge status: Home / Self Care | Payer: Self-pay | Source: Home / Self Care | Attending: Ophthalmology

## 2019-12-10 ENCOUNTER — Ambulatory Visit: Payer: Medicare HMO | Admitting: Anesthesiology

## 2019-12-10 DIAGNOSIS — E7800 Pure hypercholesterolemia, unspecified: Secondary | ICD-10-CM | POA: Insufficient documentation

## 2019-12-10 DIAGNOSIS — H25812 Combined forms of age-related cataract, left eye: Secondary | ICD-10-CM | POA: Diagnosis not present

## 2019-12-10 DIAGNOSIS — H2512 Age-related nuclear cataract, left eye: Secondary | ICD-10-CM | POA: Insufficient documentation

## 2019-12-10 DIAGNOSIS — Z79899 Other long term (current) drug therapy: Secondary | ICD-10-CM | POA: Insufficient documentation

## 2019-12-10 DIAGNOSIS — I10 Essential (primary) hypertension: Secondary | ICD-10-CM | POA: Diagnosis not present

## 2019-12-10 HISTORY — PX: CATARACT EXTRACTION W/PHACO: SHX586

## 2019-12-10 SURGERY — PHACOEMULSIFICATION, CATARACT, WITH IOL INSERTION
Anesthesia: Monitor Anesthesia Care | Site: Eye | Laterality: Left

## 2019-12-10 MED ORDER — ACETAMINOPHEN 325 MG PO TABS: 325.0000 mg | ORAL_TABLET | ORAL | Status: DC | PRN

## 2019-12-10 MED ORDER — LIDOCAINE HCL (PF) 2 % IJ SOLN
INTRAOCULAR | Status: DC | PRN
  Administered 2019-12-10: 10:00:00 1 mL via INTRAOCULAR

## 2019-12-10 MED ORDER — MOXIFLOXACIN HCL 0.5 % OP SOLN
OPHTHALMIC | Status: DC | PRN
  Administered 2019-12-10: 0.2 mL via OPHTHALMIC

## 2019-12-10 MED ORDER — ARMC OPHTHALMIC DILATING DROPS
1.0000 "application " | OPHTHALMIC | Status: DC | PRN
  Administered 2019-12-10 (×3): 1 via OPHTHALMIC

## 2019-12-10 MED ORDER — SODIUM HYALURONATE 23 MG/ML IO SOLN
INTRAOCULAR | Status: DC | PRN
  Administered 2019-12-10: 0.6 mL via INTRAOCULAR

## 2019-12-10 MED ORDER — TETRACAINE HCL 0.5 % OP SOLN
1.0000 [drp] | OPHTHALMIC | Status: DC | PRN
  Administered 2019-12-10 (×3): 1 [drp] via OPHTHALMIC

## 2019-12-10 MED ORDER — ACETAMINOPHEN 160 MG/5ML PO SOLN: 325.0000 mg | ORAL | Status: DC | PRN

## 2019-12-10 MED ORDER — MIDAZOLAM HCL 2 MG/2ML IJ SOLN
INTRAMUSCULAR | Status: DC | PRN
  Administered 2019-12-10: 1 mg via INTRAVENOUS

## 2019-12-10 MED ORDER — SODIUM HYALURONATE 10 MG/ML IO SOLN
INTRAOCULAR | Status: DC | PRN
  Administered 2019-12-10: 0.55 mL via INTRAOCULAR

## 2019-12-10 MED ORDER — FENTANYL CITRATE (PF) 100 MCG/2ML IJ SOLN
INTRAMUSCULAR | Status: DC | PRN
  Administered 2019-12-10: 50 ug via INTRAVENOUS

## 2019-12-10 MED ORDER — EPINEPHRINE PF 1 MG/ML IJ SOLN
INTRAOCULAR | Status: DC | PRN
  Administered 2019-12-10: 78 mL via OPHTHALMIC

## 2019-12-10 SURGICAL SUPPLY — 19 items
CANNULA ANT/CHMB 27G (MISCELLANEOUS) ×2 IMPLANT
CANNULA ANT/CHMB 27GA (MISCELLANEOUS) ×6 IMPLANT
DISSECTOR HYDRO NUCLEUS 50X22 (MISCELLANEOUS) ×3 IMPLANT
GLOVE SURG LX 7.5 STRW (GLOVE) ×4
GLOVE SURG LX STRL 7.5 STRW (GLOVE) ×1 IMPLANT
GLOVE SURG SYN 8.5  E (GLOVE) ×2
GLOVE SURG SYN 8.5 E (GLOVE) ×1 IMPLANT
GLOVE SURG SYN 8.5 PF PI (GLOVE) ×1 IMPLANT
GOWN STRL REUS W/ TWL LRG LVL3 (GOWN DISPOSABLE) ×2 IMPLANT
GOWN STRL REUS W/TWL LRG LVL3 (GOWN DISPOSABLE) ×4
LENS IOL TECNIS ITEC 22.5 (Intraocular Lens) ×2 IMPLANT
MARKER SKIN DUAL TIP RULER LAB (MISCELLANEOUS) ×3 IMPLANT
PACK DR. KING ARMS (PACKS) ×3 IMPLANT
PACK EYE AFTER SURG (MISCELLANEOUS) ×3 IMPLANT
PACK OPTHALMIC (MISCELLANEOUS) ×3 IMPLANT
SYR 3ML LL SCALE MARK (SYRINGE) ×3 IMPLANT
SYR TB 1ML LUER SLIP (SYRINGE) ×3 IMPLANT
WATER STERILE IRR 250ML POUR (IV SOLUTION) ×3 IMPLANT
WIPE NON LINTING 3.25X3.25 (MISCELLANEOUS) ×3 IMPLANT

## 2019-12-10 NOTE — Anesthesia Procedure Notes (Signed)
Procedure Name: MAC Performed by: Izetta Dakin, CRNA Pre-anesthesia Checklist: Patient identified, Emergency Drugs available, Suction available, Patient being monitored and Timeout performed Patient Re-evaluated:Patient Re-evaluated prior to induction Oxygen Delivery Method: Nasal cannula

## 2019-12-10 NOTE — Anesthesia Postprocedure Evaluation (Signed)
Anesthesia Post Note  Patient: Tiffany Benjamin  Procedure(s) Performed: CATARACT EXTRACTION PHACO AND INTRAOCULAR LENS PLACEMENT (IOC) LEFT (Left Eye)     Patient location during evaluation: PACU Anesthesia Type: MAC Level of consciousness: awake and alert Pain management: pain level controlled Vital Signs Assessment: post-procedure vital signs reviewed and stable Respiratory status: spontaneous breathing, nonlabored ventilation, respiratory function stable and patient connected to nasal cannula oxygen Cardiovascular status: stable and blood pressure returned to baseline Postop Assessment: no apparent nausea or vomiting Anesthetic complications: no    Wanda Plump Erbie Arment

## 2019-12-10 NOTE — Op Note (Signed)
OPERATIVE NOTE  Tiffany Benjamin [id number] 12/10/2019   PREOPERATIVE DIAGNOSIS:  Nuclear sclerotic cataract left eye.  H25.12   POSTOPERATIVE DIAGNOSIS:    Nuclear sclerotic cataract left eye.     PROCEDURE:  Phacoemusification with posterior chamber intraocular lens placement of the left eye   LENS:   Implant Name Type Inv. Item Serial No. Manufacturer Lot No. LRB No. Used Action  LENS IOL DIOP 22.5 - [id number] Intraocular Lens LENS IOL DIOP 22.5 [id number] AMO  Left 1 Implanted      Procedure(s) with comments: CATARACT EXTRACTION PHACO AND INTRAOCULAR LENS PLACEMENT (IOC) LEFT (Left) - CDE 3.55 U/S 0:45.9  PCB00 +22.5   ULTRASOUND TIME: 0 minutes 45 seconds.  CDE 3.55   SURGEON:  Willey Blade, MD, MPH   ANESTHESIA:  Topical with tetracaine drops augmented with 1% preservative-free intracameral lidocaine.  ESTIMATED BLOOD LOSS: <1 mL   COMPLICATIONS:  None.   DESCRIPTION OF PROCEDURE:  The patient was identified in the holding room and transported to the operating room and placed in the supine position under the operating microscope.  The left eye was identified as the operative eye and it was prepped and draped in the usual sterile ophthalmic fashion.   A 1.0 millimeter clear-corneal paracentesis was made at the 5:00 position. 0.5 ml of preservative-free 1% lidocaine with epinephrine was injected into the anterior chamber.  The anterior chamber was filled with Healon 5 viscoelastic.  A 2.4 millimeter keratome was used to make a near-clear corneal incision at the 2:00 position.  A curvilinear capsulorrhexis was made with a cystotome and capsulorrhexis forceps.  Balanced salt solution was used to hydrodissect and hydrodelineate the nucleus.   Phacoemulsification was then used in stop and chop fashion to remove the lens nucleus and epinucleus.  The remaining cortex was then removed using the irrigation and aspiration handpiece. Healon was then placed into the capsular bag to  distend it for lens placement.  A lens was then injected into the capsular bag.  The remaining viscoelastic was aspirated.   Wounds were hydrated with balanced salt solution.  The anterior chamber was inflated to a physiologic pressure with balanced salt solution.  Intracameral vigamox 0.1 mL undiltued was injected into the eye and a drop placed onto the ocular surface.  No wound leaks were noted.  The patient was taken to the recovery room in stable condition without complications of anesthesia or surgery  Willey Blade 12/10/2019, 10:12 AM

## 2019-12-10 NOTE — Anesthesia Preprocedure Evaluation (Signed)
Anesthesia Evaluation  Patient identified by MRN, date of birth, ID band  Airway Mallampati: II  TM Distance: >3 FB     Dental   Pulmonary    Pulmonary exam normal        Cardiovascular hypertension, negative cardio ROS Normal cardiovascular exam     Neuro/Psych PSYCHIATRIC DISORDERS Anxiety Depression    GI/Hepatic negative GI ROS,   Endo/Other    Renal/GU      Musculoskeletal  (+) Arthritis ,   Abdominal   Peds  Hematology   Anesthesia Other Findings   Reproductive/Obstetrics                             Anesthesia Physical Anesthesia Plan  ASA: II  Anesthesia Plan: MAC   Post-op Pain Management:    Induction: Intravenous  PONV Risk Score and Plan:   Airway Management Planned: Natural Airway  Additional Equipment:   Intra-op Plan:   Post-operative Plan:   Informed Consent: I have reviewed the patients History and Physical, chart, labs and discussed the procedure including the risks, benefits and alternatives for the proposed anesthesia with the patient or authorized representative who has indicated his/her understanding and acceptance.       Plan Discussed with: CRNA, Anesthesiologist and Surgeon  Anesthesia Plan Comments:         Anesthesia Quick Evaluation

## 2019-12-10 NOTE — H&P (Signed)

## 2019-12-10 NOTE — Transfer of Care (Signed)
Immediate Anesthesia Transfer of Care Note  Patient: Tiffany Benjamin  Procedure(s) Performed: CATARACT EXTRACTION PHACO AND INTRAOCULAR LENS PLACEMENT (IOC) LEFT (Left Eye)  Patient Location: PACU  Anesthesia Type: MAC  Level of Consciousness: awake, alert  and patient cooperative  Airway and Oxygen Therapy: Patient Spontanous Breathing and Patient connected to supplemental oxygen  Post-op Assessment: Post-op Vital signs reviewed, Patient's Cardiovascular Status Stable, Respiratory Function Stable, Patent Airway and No signs of Nausea or vomiting  Post-op Vital Signs: Reviewed and stable  Complications: No apparent anesthesia complications

## 2019-12-11 ENCOUNTER — Encounter: Payer: Self-pay | Admitting: *Deleted

## 2019-12-20 ENCOUNTER — Encounter: Payer: Self-pay | Admitting: Ophthalmology

## 2019-12-20 ENCOUNTER — Other Ambulatory Visit: Payer: Self-pay

## 2019-12-20 ENCOUNTER — Other Ambulatory Visit: Payer: Self-pay | Admitting: Internal Medicine

## 2019-12-20 DIAGNOSIS — H2511 Age-related nuclear cataract, right eye: Secondary | ICD-10-CM | POA: Diagnosis not present

## 2019-12-26 NOTE — Anesthesia Preprocedure Evaluation (Addendum)
Anesthesia Evaluation  Patient identified by MRN, date of birth, ID band Patient awake    Reviewed: Allergy & Precautions, NPO status , Patient's Chart, lab work & pertinent test results  History of Anesthesia Complications Negative for: history of anesthetic complications  Airway Mallampati: III  TM Distance: >3 FB Neck ROM: Full    Dental  (+) Partial Upper   Pulmonary    breath sounds clear to auscultation       Cardiovascular hypertension, (-) angina(-) DOE  Rhythm:Regular Rate:Normal   HLD   Neuro/Psych PSYCHIATRIC DISORDERS Anxiety Depression    GI/Hepatic PUD, neg GERD  ,  Endo/Other    Renal/GU CRFRenal disease     Musculoskeletal  (+) Arthritis ,   Abdominal (+) + obese (BMI 33),   Peds  Hematology   Anesthesia Other Findings   Reproductive/Obstetrics                            Anesthesia Physical Anesthesia Plan  ASA: II  Anesthesia Plan: MAC   Post-op Pain Management:    Induction: Intravenous  PONV Risk Score and Plan: 2 and TIVA, Midazolam and Treatment may vary due to age or medical condition  Airway Management Planned: Nasal Cannula  Additional Equipment:   Intra-op Plan:   Post-operative Plan:   Informed Consent: I have reviewed the patients History and Physical, chart, labs and discussed the procedure including the risks, benefits and alternatives for the proposed anesthesia with the patient or authorized representative who has indicated his/her understanding and acceptance.       Plan Discussed with: CRNA and Anesthesiologist  Anesthesia Plan Comments:         Anesthesia Quick Evaluation

## 2019-12-27 ENCOUNTER — Other Ambulatory Visit
Admission: RE | Admit: 2019-12-27 | Discharge: 2019-12-27 | Disposition: A | Discharge status: Home / Self Care | Payer: Medicare HMO | Source: Ambulatory Visit | Attending: Ophthalmology | Admitting: Ophthalmology

## 2019-12-27 ENCOUNTER — Other Ambulatory Visit: Payer: Self-pay | Admitting: Internal Medicine

## 2019-12-27 ENCOUNTER — Other Ambulatory Visit: Payer: Self-pay

## 2019-12-27 DIAGNOSIS — Z20822 Contact with and (suspected) exposure to covid-19: Secondary | ICD-10-CM | POA: Insufficient documentation

## 2019-12-27 DIAGNOSIS — Z01812 Encounter for preprocedural laboratory examination: Secondary | ICD-10-CM | POA: Insufficient documentation

## 2019-12-27 LAB — SARS CORONAVIRUS 2 (TAT 6-24 HRS): SARS Coronavirus 2: NEGATIVE

## 2019-12-27 NOTE — Discharge Instructions (Signed)

## 2019-12-31 ENCOUNTER — Ambulatory Visit: Payer: Medicare HMO | Admitting: Anesthesiology

## 2019-12-31 ENCOUNTER — Encounter: Payer: Self-pay | Admitting: Ophthalmology

## 2019-12-31 ENCOUNTER — Encounter
Admission: RE | Disposition: A | Discharge status: Home / Self Care | Payer: Self-pay | Source: Home / Self Care | Attending: Ophthalmology

## 2019-12-31 ENCOUNTER — Ambulatory Visit
Admission: RE | Admit: 2019-12-31 | Discharge: 2019-12-31 | Disposition: A | Discharge status: Home / Self Care | Payer: Medicare HMO | Attending: Ophthalmology | Admitting: Ophthalmology

## 2019-12-31 ENCOUNTER — Other Ambulatory Visit: Payer: Self-pay

## 2019-12-31 DIAGNOSIS — M1990 Unspecified osteoarthritis, unspecified site: Secondary | ICD-10-CM | POA: Diagnosis not present

## 2019-12-31 DIAGNOSIS — I10 Essential (primary) hypertension: Secondary | ICD-10-CM | POA: Diagnosis not present

## 2019-12-31 DIAGNOSIS — Z79899 Other long term (current) drug therapy: Secondary | ICD-10-CM | POA: Insufficient documentation

## 2019-12-31 DIAGNOSIS — H2511 Age-related nuclear cataract, right eye: Secondary | ICD-10-CM | POA: Insufficient documentation

## 2019-12-31 DIAGNOSIS — Z9842 Cataract extraction status, left eye: Secondary | ICD-10-CM | POA: Diagnosis not present

## 2019-12-31 DIAGNOSIS — E785 Hyperlipidemia, unspecified: Secondary | ICD-10-CM | POA: Insufficient documentation

## 2019-12-31 DIAGNOSIS — H25811 Combined forms of age-related cataract, right eye: Secondary | ICD-10-CM | POA: Diagnosis not present

## 2019-12-31 DIAGNOSIS — E7800 Pure hypercholesterolemia, unspecified: Secondary | ICD-10-CM | POA: Insufficient documentation

## 2019-12-31 HISTORY — PX: CATARACT EXTRACTION W/PHACO: SHX586

## 2019-12-31 SURGERY — PHACOEMULSIFICATION, CATARACT, WITH IOL INSERTION
Anesthesia: Monitor Anesthesia Care | Site: Eye | Laterality: Right

## 2019-12-31 MED ORDER — SODIUM HYALURONATE 10 MG/ML IO SOLN
INTRAOCULAR | Status: DC | PRN
  Administered 2019-12-31: 0.55 mL via INTRAOCULAR

## 2019-12-31 MED ORDER — ACETAMINOPHEN 10 MG/ML IV SOLN: 1000.0000 mg | Freq: Once | INTRAVENOUS | Status: DC | PRN

## 2019-12-31 MED ORDER — TETRACAINE HCL 0.5 % OP SOLN
1.0000 [drp] | OPHTHALMIC | Status: DC | PRN
  Administered 2019-12-31 (×3): 1 [drp] via OPHTHALMIC

## 2019-12-31 MED ORDER — EPINEPHRINE PF 1 MG/ML IJ SOLN
INTRAOCULAR | Status: DC | PRN
  Administered 2019-12-31: 99 mL via OPHTHALMIC

## 2019-12-31 MED ORDER — MOXIFLOXACIN HCL 0.5 % OP SOLN
OPHTHALMIC | Status: DC | PRN
  Administered 2019-12-31: 0.2 mL via OPHTHALMIC

## 2019-12-31 MED ORDER — LIDOCAINE HCL (PF) 2 % IJ SOLN
INTRAOCULAR | Status: DC | PRN
  Administered 2019-12-31: 1 mL via INTRAOCULAR

## 2019-12-31 MED ORDER — SODIUM HYALURONATE 23 MG/ML IO SOLN
INTRAOCULAR | Status: DC | PRN
  Administered 2019-12-31: 0.6 mL via INTRAOCULAR

## 2019-12-31 MED ORDER — FENTANYL CITRATE (PF) 100 MCG/2ML IJ SOLN
INTRAMUSCULAR | Status: DC | PRN
  Administered 2019-12-31: 50 ug via INTRAVENOUS

## 2019-12-31 MED ORDER — LACTATED RINGERS IV SOLN: 100.0000 mL/h | INTRAVENOUS | Status: DC

## 2019-12-31 MED ORDER — MIDAZOLAM HCL 2 MG/2ML IJ SOLN
INTRAMUSCULAR | Status: DC | PRN
  Administered 2019-12-31: 1 mg via INTRAVENOUS

## 2019-12-31 MED ORDER — ARMC OPHTHALMIC DILATING DROPS
1.0000 "application " | OPHTHALMIC | Status: DC | PRN
  Administered 2019-12-31 (×3): 1 via OPHTHALMIC

## 2019-12-31 MED ORDER — ONDANSETRON HCL 4 MG/2ML IJ SOLN: 4.0000 mg | Freq: Once | INTRAMUSCULAR | Status: DC | PRN

## 2019-12-31 SURGICAL SUPPLY — 19 items
CANNULA ANT/CHMB 27G (MISCELLANEOUS) ×2 IMPLANT
CANNULA ANT/CHMB 27GA (MISCELLANEOUS) ×6 IMPLANT
DISSECTOR HYDRO NUCLEUS 50X22 (MISCELLANEOUS) ×3 IMPLANT
GLOVE SURG LX 7.5 STRW (GLOVE) ×4
GLOVE SURG LX STRL 7.5 STRW (GLOVE) ×1 IMPLANT
GLOVE SURG SYN 8.5  E (GLOVE) ×2
GLOVE SURG SYN 8.5 E (GLOVE) ×1 IMPLANT
GLOVE SURG SYN 8.5 PF PI (GLOVE) ×1 IMPLANT
GOWN STRL REUS W/ TWL LRG LVL3 (GOWN DISPOSABLE) ×2 IMPLANT
GOWN STRL REUS W/TWL LRG LVL3 (GOWN DISPOSABLE) ×4
LENS IOL TECNIS ITEC 23.5 (Intraocular Lens) ×2 IMPLANT
MARKER SKIN DUAL TIP RULER LAB (MISCELLANEOUS) ×3 IMPLANT
PACK DR. KING ARMS (PACKS) ×3 IMPLANT
PACK EYE AFTER SURG (MISCELLANEOUS) ×3 IMPLANT
PACK OPTHALMIC (MISCELLANEOUS) ×3 IMPLANT
SYR 3ML LL SCALE MARK (SYRINGE) ×3 IMPLANT
SYR TB 1ML LUER SLIP (SYRINGE) ×3 IMPLANT
WATER STERILE IRR 250ML POUR (IV SOLUTION) ×3 IMPLANT
WIPE NON LINTING 3.25X3.25 (MISCELLANEOUS) ×3 IMPLANT

## 2019-12-31 NOTE — H&P (Signed)

## 2019-12-31 NOTE — Op Note (Signed)
OPERATIVE NOTE  Tiffany Benjamin [id number] 12/31/2019   PREOPERATIVE DIAGNOSIS:  Nuclear sclerotic cataract right eye.  H25.11   POSTOPERATIVE DIAGNOSIS:    Nuclear sclerotic cataract right eye.     PROCEDURE:  Phacoemusification with posterior chamber intraocular lens placement of the right eye   LENS:   Implant Name Type Inv. Item Serial No. Manufacturer Lot No. LRB No. Used Action  LENS IOL DIOP 23.5 - [id number] Intraocular Lens LENS IOL DIOP 23.5 [id number] AMO  Right 1 Implanted       Procedure(s) with comments: CATARACT EXTRACTION PHACO AND INTRAOCULAR LENS PLACEMENT (IOC) RIGHT (Right) - 4.11 0:40.1  PCB00 +23.5   ULTRASOUND TIME: 0 minutes 40 seconds.  CDE 4.11   SURGEON:  Willey Blade, MD, MPH  ANESTHESIOLOGIST: Anesthesiologist: Heniser, Burman Foster, MD CRNA: Michaele Offer, CRNA   ANESTHESIA:  Topical with tetracaine drops augmented with 1% preservative-free intracameral lidocaine.  ESTIMATED BLOOD LOSS: less than 1 mL.   COMPLICATIONS:  None.   DESCRIPTION OF PROCEDURE:  The patient was identified in the holding room and transported to the operating room and placed in the supine position under the operating microscope.  The right eye was identified as the operative eye and it was prepped and draped in the usual sterile ophthalmic fashion.   A 1.0 millimeter clear-corneal paracentesis was made at the 10:30 position. 0.5 ml of preservative-free 1% lidocaine with epinephrine was injected into the anterior chamber.  The anterior chamber was filled with Healon 5 viscoelastic.  A 2.4 millimeter keratome was used to make a near-clear corneal incision at the 8:00 position.  A curvilinear capsulorrhexis was made with a cystotome and capsulorrhexis forceps.  Balanced salt solution was used to hydrodissect and hydrodelineate the nucleus.   Phacoemulsification was then used in stop and chop fashion to remove the lens nucleus and epinucleus.  The remaining cortex was then removed  using the irrigation and aspiration handpiece. Healon was then placed into the capsular bag to distend it for lens placement.  A lens was then injected into the capsular bag.  The remaining viscoelastic was aspirated.   Wounds were hydrated with balanced salt solution.  The anterior chamber was inflated to a physiologic pressure with balanced salt solution.   Intracameral vigamox 0.1 mL undiluted was injected into the eye and a drop placed onto the ocular surface.  No wound leaks were noted.  The patient was taken to the recovery room in stable condition without complications of anesthesia or surgery  Willey Blade 12/31/2019, 9:31 AM

## 2019-12-31 NOTE — Transfer of Care (Signed)
Immediate Anesthesia Transfer of Care Note  Patient: Tiffany Benjamin  Procedure(s) Performed: CATARACT EXTRACTION PHACO AND INTRAOCULAR LENS PLACEMENT (IOC) RIGHT (Right Eye)  Patient Location: PACU  Anesthesia Type: MAC  Level of Consciousness: awake, alert  and patient cooperative  Airway and Oxygen Therapy: Patient Spontanous Breathing and Patient connected to supplemental oxygen  Post-op Assessment: Post-op Vital signs reviewed, Patient's Cardiovascular Status Stable, Respiratory Function Stable, Patent Airway and No signs of Nausea or vomiting  Post-op Vital Signs: Reviewed and stable  Complications: No apparent anesthesia complications

## 2019-12-31 NOTE — Anesthesia Postprocedure Evaluation (Signed)
Anesthesia Post Note  Patient: Tiffany Benjamin  Procedure(s) Performed: CATARACT EXTRACTION PHACO AND INTRAOCULAR LENS PLACEMENT (IOC) RIGHT (Right Eye)     Patient location during evaluation: PACU Anesthesia Type: MAC Level of consciousness: awake and alert Pain management: pain level controlled Vital Signs Assessment: post-procedure vital signs reviewed and stable Respiratory status: spontaneous breathing, nonlabored ventilation, respiratory function stable and patient connected to nasal cannula oxygen Cardiovascular status: stable and blood pressure returned to baseline Postop Assessment: no apparent nausea or vomiting Anesthetic complications: no    Dawsyn Ramsaran A  Geanie Pacifico

## 2020-01-01 ENCOUNTER — Encounter: Payer: Self-pay | Admitting: *Deleted

## 2020-01-10 ENCOUNTER — Telehealth: Payer: Self-pay | Admitting: Internal Medicine

## 2020-01-10 NOTE — Telephone Encounter (Signed)
Pt's daughter says that pt told her that Dr Darrick Huntsman told her that she could not be by herself at night time. She would like clarification or confirmation about that. Please call

## 2020-01-11 NOTE — Telephone Encounter (Signed)
Patient's daughter returned Tiffany Benjamin's phone call. Please call daughter back.

## 2020-01-11 NOTE — Telephone Encounter (Signed)
LMTCB

## 2020-01-11 NOTE — Telephone Encounter (Signed)
Spoke with pt's daughter, Nicole Cella after looking through the chart. I let her know that I could not find any documentation that stated that patient should not stay by herself at night. Nicole Cella stated that her brother and his wife live with her and they are going to be going out of town, but she lives right next door to patient. Nicole Cella is wanting to make sure there is not any orders stating that the pt should not be left alone at night. She stated that since lives right next door she didn't see any issues with it.

## 2020-01-11 NOTE — Telephone Encounter (Signed)
As long as patient knows how to contact them   She'll be fine

## 2020-01-14 NOTE — Telephone Encounter (Signed)
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 Daughter gave a verbal understanding.

## 2020-01-14 NOTE — Telephone Encounter (Signed)
LMTCB

## 2020-01-20 ENCOUNTER — Other Ambulatory Visit: Payer: Self-pay | Admitting: Internal Medicine

## 2020-01-23 DIAGNOSIS — Z961 Presence of intraocular lens: Secondary | ICD-10-CM | POA: Diagnosis not present

## 2020-02-04 ENCOUNTER — Telehealth: Payer: Self-pay | Admitting: Internal Medicine

## 2020-02-04 NOTE — Telephone Encounter (Signed)
Pt states that she has pain in her knees and would like to know if there is anything that she can take for them.

## 2020-02-05 NOTE — Telephone Encounter (Signed)
Spoke with pt's daughter, Nicole Cella, and scheduled pt for an in office appt to discuss her knee pain.

## 2020-02-07 ENCOUNTER — Encounter: Payer: Self-pay | Admitting: Internal Medicine

## 2020-02-07 ENCOUNTER — Other Ambulatory Visit: Payer: Self-pay

## 2020-02-07 ENCOUNTER — Ambulatory Visit (INDEPENDENT_AMBULATORY_CARE_PROVIDER_SITE_OTHER): Payer: Medicare HMO | Admitting: Internal Medicine

## 2020-02-07 VITALS — BP 142/94 | HR 60 | Temp 97.5°F | Resp 16 | Ht 61.5 in | Wt 179.6 lb

## 2020-02-07 DIAGNOSIS — E785 Hyperlipidemia, unspecified: Secondary | ICD-10-CM | POA: Diagnosis not present

## 2020-02-07 DIAGNOSIS — I10 Essential (primary) hypertension: Secondary | ICD-10-CM

## 2020-02-07 DIAGNOSIS — R7303 Prediabetes: Secondary | ICD-10-CM | POA: Diagnosis not present

## 2020-02-07 DIAGNOSIS — M170 Bilateral primary osteoarthritis of knee: Secondary | ICD-10-CM

## 2020-02-07 LAB — HEMOGLOBIN A1C: Hgb A1c MFr Bld: 5.7 % (ref 4.6–6.5)

## 2020-02-07 LAB — COMPREHENSIVE METABOLIC PANEL
ALT: 13 U/L (ref 0–35)
AST: 19 U/L (ref 0–37)
Albumin: 4 g/dL (ref 3.5–5.2)
Alkaline Phosphatase: 70 U/L (ref 39–117)
BUN: 31 mg/dL — ABNORMAL HIGH (ref 6–23)
CO2: 27 mEq/L (ref 19–32)
Calcium: 8.9 mg/dL (ref 8.4–10.5)
Chloride: 103 mEq/L (ref 96–112)
Creatinine, Ser: 1.52 mg/dL — ABNORMAL HIGH (ref 0.40–1.20)
GFR: 39.31 mL/min — ABNORMAL LOW (ref >60.00)
Glucose, Bld: 90 mg/dL (ref 70–99)
Potassium: 4.3 mEq/L (ref 3.5–5.1)
Sodium: 138 mEq/L (ref 135–145)
Total Bilirubin: 0.5 mg/dL (ref 0.2–1.2)
Total Protein: 7.2 g/dL (ref 6.0–8.3)

## 2020-02-07 LAB — LIPID PANEL
Cholesterol: 167 mg/dL (ref 0–200)
HDL: 67 mg/dL (ref >39.00)
LDL Cholesterol: 78 mg/dL (ref 0–99)
NonHDL: 100.38
Total CHOL/HDL Ratio: 2
Triglycerides: 113 mg/dL (ref 0.0–149.0)
VLDL: 22.6 mg/dL (ref 0.0–40.0)

## 2020-02-07 NOTE — Patient Instructions (Signed)
Increase your tramadol  to morning  afternoon and evening     You can add up to 2000 mg of acetominophen (tylenol) every day safely  In divided doses (500 mg every 6 hours  Or 1000 mg every 12 hours.)    I will  make the referral to orthopedic  doctor to give you an injection that puts the fluid back in your joints

## 2020-02-07 NOTE — Progress Notes (Signed)
Subjective:  Patient ID: Tiffany Benjamin, female    DOB: 12-12-1932  Age: 86 y.o. MRN: [id number]  CC: The primary encounter diagnosis was Hyperlipidemia, unspecified hyperlipidemia type. Diagnoses of Essential hypertension, Prediabetes, and Tricompartment osteoarthritis of both knees were also pertinent to this visit.  HPI CHARLENE DETTER presents for  Bilateral knee pain,  Left greater than right. Her pain is chronic and due to DJD .  She has had steroid injections in both knees and has bee prescribed tramadol,  But is not taking it more than once daily .  The pain is limiting her activities.   Outpatient Medications Prior to Visit  Medication Sig Dispense Refill  . amLODipine (NORVASC) 10 MG tablet TAKE 1 TABLET (10 MG TOTAL) BY MOUTH DAILY. IN THE MORNING FOR HYPERTENSION 90 tablet 1  . Ascorbic Acid (VITAMIN C PO) Take 1 tablet by mouth daily.    Marland Kitchen aspirin 81 MG tablet Take 81 mg by mouth daily.    Marland Kitchen BIOTIN PO Take by mouth daily.    . busPIRone (BUSPAR) 10 MG tablet TAKE 1 TABLET BY MOUTH THREE TIMES A DAY 270 tablet 1  . Cyanocobalamin (VITAMIN B-12 PO) Take by mouth daily.    . hydrochlorothiazide (HYDRODIURIL) 25 MG tablet Take 1 tablet (25 mg total) by mouth daily. In the morning for hypertension 90 tablet 3  . losartan (COZAAR) 100 MG tablet TAKE 1 TABLET (100 MG TOTAL) BY MOUTH DAILY. IN THE EVENING FOR HYPERTENSION 90 tablet 1  . metoprolol succinate (TOPROL-XL) 50 MG 24 hr tablet TAKE 1 TABLET (50 MG TOTAL) BY MOUTH AT BEDTIME. FOR HYPERTENSION 90 tablet 1  . Multiple Vitamins-Minerals (CENTRUM SILVER PO) Take by mouth daily.    . Omega 3 1000 MG CAPS 1 capsule twice daily with meals 360 each 3  . simvastatin (ZOCOR) 20 MG tablet TAKE 1 TABLET BY MOUTH EVERY DAY IN THE EVENING 90 tablet 1  . traMADol (ULTRAM) 50 MG tablet Take 1 tablet (50 mg total) by mouth every 6 (six) hours as needed. Maximum 4 tablets daily 120 tablet 4  . VITAMIN D, CHOLECALCIFEROL, PO Take 5,000 mg by  mouth daily.    Marland Kitchen VITAMIN E PO Take 1 capsule by mouth daily.     Facility-Administered Medications Prior to Visit  Medication Dose Route Frequency Provider Last Rate Last Admin  . cloNIDine (CATAPRES) tablet 0.1 mg  0.1 mg Oral Once Crecencio Mc, MD        Review of Systems;  Patient denies headache, fevers, malaise, unintentional weight loss, skin rash, eye pain, sinus congestion and sinus pain, sore throat, dysphagia,  hemoptysis , cough, dyspnea, wheezing, chest pain, palpitations, orthopnea, edema, abdominal pain, nausea, melena, diarrhea, constipation, flank pain, dysuria, hematuria, urinary  Frequency, nocturia, numbness, tingling, seizures,  Focal weakness, Loss of consciousness,  Tremor, insomnia, depression, anxiety, and suicidal ideation.      Objective:  BP (!) 142/94 (BP Location: Left Arm, Patient Position: Sitting, Cuff Size: Normal)   Pulse 60   Temp (!) 97.5 F (36.4 C) (Temporal)   Resp 16   Ht 5' 1.5" (1.562 m)   Wt 179 lb 9.6 oz (81.5 kg)   SpO2 99%   BMI 33.39 kg/m   BP Readings from Last 3 Encounters:  02/07/20 (!) 142/94  12/31/19 (!) 167/67  12/10/19 121/64    Wt Readings from Last 3 Encounters:  02/07/20 179 lb 9.6 oz (81.5 kg)  12/31/19 185 lb (83.9 kg)  12/10/19 180 lb 14.4 oz (82.1 kg)    General appearance: alert, cooperative and appears stated age Ears: normal TM's and external ear canals both ears Throat: lips, mucosa, and tongue normal; teeth and gums normal Neck: no adenopathy, no carotid bruit, supple, symmetrical, trachea midline and thyroid not enlarged, symmetric, no tenderness/mass/nodules Back: symmetric, no curvature. ROM normal. No CVA tenderness. Lungs: clear to auscultation bilaterally Heart: regular rate and rhythm, S1, S2 normal, no murmur, click, rub or gallop Abdomen: soft, non-tender; bowel sounds normal; no masses,  no organomegaly Pulses: 2+ and symmetric EXT:  bilateral enlarged bony knees.  No effusions.   Skin:  Skin color, texture, turgor normal. No rashes or lesions Lymph nodes: Cervical, supraclavicular, and axillary nodes normal.  Lab Results  Component Value Date   HGBA1C 5.7 02/07/2020   HGBA1C 5.9 06/26/2019   HGBA1C 5.7 06/02/2018    Lab Results  Component Value Date   CREATININE 1.52 (H) 02/07/2020   CREATININE 1.24 (H) 06/26/2019   CREATININE 1.23 (H) 06/07/2018    Lab Results  Component Value Date   WBC 7.2 06/26/2019   HGB 12.7 06/26/2019   HCT 37.8 06/26/2019   PLT 269.0 06/26/2019   GLUCOSE 90 02/07/2020   CHOL 167 02/07/2020   TRIG 113.0 02/07/2020   HDL 67.00 02/07/2020   LDLDIRECT 81.0 12/09/2016   LDLCALC 78 02/07/2020   ALT 13 02/07/2020   AST 19 02/07/2020   NA 138 02/07/2020   K 4.3 02/07/2020   CL 103 02/07/2020   CREATININE 1.52 (H) 02/07/2020   BUN 31 (H) 02/07/2020   CO2 27 02/07/2020   TSH 1.26 06/26/2019   HGBA1C 5.7 02/07/2020   MICROALBUR 10.3 (H) 06/02/2018    No results found.  Assessment & Plan:   Problem List Items Addressed This Visit      Unprioritized   Hyperlipidemia - Primary   Relevant Orders   Lipid panel (Completed)   Hypertension   Relevant Orders   Comprehensive metabolic panel (Completed)   Tricompartment Severe Knee OA, Bilateral    Referral to  Orthopedics for Synvisc injections  Discussed increasing  tramadol to three times daily       Relevant Orders   AMB referral to orthopedics    Other Visit Diagnoses    Prediabetes       Relevant Orders   Hemoglobin A1c (Completed)      I am having Avereigh M. Cogdell maintain her aspirin, Omega 3, (VITAMIN D, CHOLECALCIFEROL, PO), Ascorbic Acid (VITAMIN C PO), VITAMIN E PO, hydrochlorothiazide, traMADol, busPIRone, Cyanocobalamin (VITAMIN B-12 PO), BIOTIN PO, Multiple Vitamins-Minerals (CENTRUM SILVER PO), amLODipine, simvastatin, losartan, and metoprolol succinate. We will continue to administer cloNIDine.  No orders of the defined types were placed in this  encounter.   There are no discontinued medications.  Follow-up: No follow-ups on file.   Sherlene Shams, MD

## 2020-02-09 NOTE — Assessment & Plan Note (Signed)
Referral to  Orthopedics for Synvisc injections  Discussed increasing  tramadol to three times daily

## 2020-02-22 DIAGNOSIS — M170 Bilateral primary osteoarthritis of knee: Secondary | ICD-10-CM | POA: Diagnosis not present

## 2020-06-01 ENCOUNTER — Other Ambulatory Visit: Payer: Self-pay | Admitting: Internal Medicine

## 2020-06-10 ENCOUNTER — Other Ambulatory Visit: Payer: Self-pay | Admitting: Internal Medicine

## 2020-06-13 ENCOUNTER — Ambulatory Visit: Payer: Medicare HMO

## 2020-06-18 ENCOUNTER — Telehealth: Payer: Self-pay

## 2020-06-18 ENCOUNTER — Ambulatory Visit: Payer: Medicare HMO | Admitting: Internal Medicine

## 2020-06-18 ENCOUNTER — Ambulatory Visit: Payer: Medicare PPO

## 2020-06-18 NOTE — Telephone Encounter (Signed)
Unsuccessful attempt to reach patient for scheduled annual wellness visit. No answer. Number provided for rescheduling.

## 2020-06-19 ENCOUNTER — Other Ambulatory Visit: Payer: Self-pay | Admitting: Internal Medicine

## 2020-06-23 ENCOUNTER — Ambulatory Visit (INDEPENDENT_AMBULATORY_CARE_PROVIDER_SITE_OTHER): Payer: Medicare PPO

## 2020-06-23 VITALS — Ht 61.5 in | Wt 179.0 lb

## 2020-06-23 DIAGNOSIS — Z0000 Encounter for general adult medical examination without abnormal findings: Secondary | ICD-10-CM

## 2020-06-23 NOTE — Progress Notes (Addendum)
Subjective:   Tiffany Benjamin is a 86 y.o. female who presents for Medicare Annual (Subsequent) preventive examination.  Review of Systems    No ROS.  Medicare Wellness Virtual Visit.    Cardiac Risk Factors include: advanced age (>5men, >32 women);hypertension     Objective:    Today's Vitals   06/23/20 1307  Weight: 179 lb (81.2 kg)  Height: 5' 1.5" (1.562 m)   Body mass index is 33.27 kg/m.  Advanced Directives 06/23/2020 12/31/2019 12/10/2019 06/13/2019 06/02/2018 03/31/2017 03/10/2016  Does Patient Have a Medical Advance Directive? Yes Yes Yes No Yes Yes Yes  Type of Estate agent of [employer] Power of [place];Living will Living will;Healthcare Power of [address] - Healthcare Power of [place];Living will Healthcare Power of [employer] of [place];Living will  Does patient want to make changes to medical advance directive? No - Patient declined No - Patient declined - - No - Patient declined No - Patient declined -  Copy of Healthcare Power of Attorney in Chart? No - copy requested No - copy requested Yes - validated most recent copy scanned in chart (See row information) - No - copy requested No - copy requested No - copy requested  Would patient like information on creating a medical advance directive? - - - No - Patient declined - - -    Current Medications (verified) Outpatient Encounter Medications as of 06/23/2020  Medication Sig  . amLODipine (NORVASC) 10 MG tablet TAKE 1 TABLET (10 MG TOTAL) BY MOUTH DAILY. IN THE MORNING FOR HYPERTENSION  . Ascorbic Acid (VITAMIN C PO) Take 1 tablet by mouth daily.  Marland Kitchen aspirin 81 MG tablet Take 81 mg by mouth daily.  Marland Kitchen BIOTIN PO Take by mouth daily.  . busPIRone (BUSPAR) 10 MG tablet TAKE 1 TABLET BY MOUTH THREE TIMES A DAY  . Cyanocobalamin (VITAMIN B-12 PO) Take by mouth daily.  . hydrochlorothiazide (HYDRODIURIL) 25 MG tablet Take 1 tablet (25 mg total) by mouth daily. In the morning for  hypertension  . losartan (COZAAR) 100 MG tablet TAKE 1 TABLET (100 MG TOTAL) BY MOUTH DAILY. IN THE EVENING FOR HYPERTENSION  . metoprolol succinate (TOPROL-XL) 50 MG 24 hr tablet TAKE 1 TABLET (50 MG TOTAL) BY MOUTH AT BEDTIME. FOR HYPERTENSION  . Multiple Vitamins-Minerals (CENTRUM SILVER PO) Take by mouth daily.  . Omega 3 1000 MG CAPS 1 capsule twice daily with meals  . simvastatin (ZOCOR) 20 MG tablet TAKE 1 TABLET BY MOUTH EVERY DAY IN THE EVENING  . traMADol (ULTRAM) 50 MG tablet TAKE 1 TABLET (50 MG TOTAL) BY MOUTH EVERY 6 (SIX) HOURS AS NEEDED. MAXIMUM 4 TABLETS DAILY  . VITAMIN D, CHOLECALCIFEROL, PO Take 5,000 mg by mouth daily.  Marland Kitchen VITAMIN E PO Take 1 capsule by mouth daily.   Facility-Administered Encounter Medications as of 06/23/2020  Medication  . cloNIDine (CATAPRES) tablet 0.1 mg    Allergies (verified) Patient has no known allergies.   History: Past Medical History:  Diagnosis Date  . Arthritis    knees  . Depression   . Family history of polyps in the colon   . History of chicken pox   . History of colonic polyps   . Hyperlipidemia   . Hypertension   . Ulcer   . UTI (urinary tract infection)    Past Surgical History:  Procedure Laterality Date  . BREAST SURGERY Right 2001   bernign lumpectomy   . CATARACT EXTRACTION W/PHACO Left 12/10/2019  Procedure: CATARACT EXTRACTION PHACO AND INTRAOCULAR LENS PLACEMENT (IOC) LEFT;  Surgeon: Nevada Crane, MD;  Location[facility];  Service: Ophthalmology;  Laterality: Left;  CDE 3.55 U/S 0:45.9  . CATARACT EXTRACTION W/PHACO Right 12/31/2019   Procedure: CATARACT EXTRACTION PHACO AND INTRAOCULAR LENS PLACEMENT (IOC) RIGHT;  Surgeon: Nevada Crane, MD;  Location: [facility];  Service: Ophthalmology;  Laterality: Right;  4.11 0:40.1   Family History  Problem Relation Age of Onset  . Stroke Mother 56       cerebral hemorrhage  . Hypertension Mother   . Diabetes Mother   . Heart disease  Father   . Heart attack Father 34  . Cancer Sister        pancreatic  . Cancer Sister 38       retroperitoneal Ca removed, doing fine   . Hyperlipidemia Son   . Bone cancer Son   . Diabetes Son    Social History   Socioeconomic History  . Marital status: Widowed    Spouse name: Not on file  . Number of children: Not on file  . Years of education: Not on file  . Highest education level: Not on file  Occupational History  . Not on file  Tobacco Use  . Smoking status: Never Smoker  . Smokeless tobacco: Never Used  Vaping Use  . Vaping Use: Never used  Substance and Sexual Activity  . Alcohol use: Yes    Alcohol/week: 5.0 standard drinks    Types: 5 Glasses of wine per week    Comment: occasional  . Drug use: No  . Sexual activity: Never  Other Topics Concern  . Not on file  Social History Narrative   Social:  Her husband died about 20 years ago from throat cancer.  She then cared for her father for  20 yrs in her home before he passed.    She is retired 20 yrs from [employer]. 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 Likes to stay busy.  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   Exercises regularly, goes to [employer] 3 days a week and stretches before hand.         Social Determinants of Health   Financial Resource Strain: Low Risk   . Difficulty of Paying Living Expenses: Not hard at all  Food Insecurity: No Food Insecurity  . Worried About Programme researcher, broadcasting/film/video in the Last Year: Never true  . Ran Out of Food in the Last Year: Never true  Transportation Needs: No Transportation Needs  . Lack of Transportation (Medical): No  . Lack of Transportation (Non-Medical): No  Physical Activity:   . Days of Exercise per Week: Not on file  . Minutes of Exercise per Session: Not on file  Stress: No Stress Concern Present  . Feeling of Stress : Not at all  Social Connections: Unknown  . Frequency of Communication with Friends and Family: Not on file  .  Frequency of Social Gatherings with Friends and Family: More than three times a week  . Attends Religious Services: Not on file  . Active Member of Clubs or Organizations: Yes  . Attends Banker Meetings: Not on file  . Marital Status: Not on file    Tobacco Counseling Counseling given: Not Answered   Clinical Intake:  Pre-visit preparation completed: Yes           How often do you need to  have someone help you when you read instructions, pamphlets, or other written materials from your doctor or pharmacy?: 1 - Never   Interpreter Needed?: No      Activities of Daily Living In your present state of health, do you have any difficulty performing the following activities: 06/23/2020 12/31/2019  Hearing? N N  Vision? N N  Difficulty concentrating or making decisions? N N  Walking or climbing stairs? N N  Comment Paces self -  Dressing or bathing? N N  Doing errands, shopping? N -  Preparing Food and eating ? N -  Using the Toilet? N -  In the past six months, have you accidently leaked urine? N -  Do you have problems with loss of bowel control? N -  Managing your Medications? N -  Managing your Finances? N -  Housekeeping or managing your Housekeeping? N -  Some recent data might be hidden    Patient Care Team: Sherlene Shamsullo, Teresa L, MD as PCP - General (Internal Medicine) Sherlene Shamsullo, Teresa L, MD (Internal Medicine)  Indicate any recent Medical Services you may have received from other than [facility] providers in the past year (date may be approximate).     Assessment:   This is a routine wellness examination for Tiffany Benjamin.  I connected with Alean today by telephone and verified that I am speaking with the correct person using two identifiers. Location patient: home Location provider: work Persons participating in the virtual visit: patient, Engineer, civil (consulting)nurse.    I discussed the limitations, risks, security and privacy concerns of performing an evaluation and management  service by telephone and the availability of in person appointments. The patient expressed understanding and verbally consented to this telephonic visit.    Interactive audio and video telecommunications were attempted between this provider and patient, however failed, due to patient having technical difficulties OR patient did not have access to video capability.  We continued and completed visit with audio only.  Some vital signs may be absent or patient reported.   Hearing/Vision screen  Hearing Screening   125Hz  250Hz  500Hz  1000Hz  2000Hz  3000Hz  4000Hz  6000Hz  8000Hz   Right ear:           Left ear:           Comments: Patient is able to hear conversational tones without difficulty.  No issues reported.  Vision Screening Comments: Wears corrective lenses when reading Visual acuity not assessed, virtual visit.  .     Dietary issues and exercise activities discussed: Current Exercise Habits: Home exercise routine  Goals      Lifestyle   . LIFESTYLE - DECREASE FALLS RISK     Do not run      Depression Screen PHQ 2/9 Scores 06/27/2020 06/23/2020 06/13/2019 06/02/2018 03/31/2017 03/10/2016 01/08/2015  PHQ - 2 Score 0 0 0 0 0 0 0  PHQ- 9 Score - - - - 0 - -    Fall Risk Fall Risk  06/27/2020 06/23/2020 02/07/2020 09/05/2019 06/26/2019  Falls in the past year? 0 0 0 0 1  Number falls in past yr: 0 0 - - 0  Comment - - - - -  Injury with Fall? 0 - - - 0  Follow up Falls evaluation completed Falls evaluation completed Falls evaluation completed Falls evaluation completed Falls evaluation completed   Handrails in use when climbing stairs? Yes Home free of loose throw rugs in walkways, pet beds, electrical cords, etc? Yes  Adequate lighting in your home to reduce risk of falls?  Yes   ASSISTIVE DEVICES UTILIZED TO PREVENT FALLS:  Life alert? No  Use of a cane, walker or w/c? No   TIMED UP AND GO: Was the test performed? No . Virtual visit.   Cognitive Function: Patient is alert and  oriented.  Enjoys reading for brain health.   MMSE - Mini Mental State Exam 03/10/2016  Orientation to time 5  Orientation to Place 5  Registration 3  Attention/ Calculation 5  Recall 3  Language- name 2 objects 2  Language- repeat 1  Language- follow 3 step command 3  Language- read & follow direction 1  Write a sentence 1  Copy design 1  Total score 30     6CIT Screen 06/13/2019 06/02/2018 03/31/2017  What Year? 0 points 0 points 0 points  What month? 0 points 0 points 0 points  What time? 0 points 0 points 0 points  Count back from 20 0 points 0 points 0 points  Months in reverse 0 points 2 points 0 points  Repeat phrase - - 0 points  Total Score - - 0    Immunizations Immunization History  Administered Date(s) Administered  . Fluad Quad(high Dose 65+) 06/19/2019, 06/27/2020  . Influenza, High Dose Seasonal PF 11/25/2015, 06/07/2018  . Influenza,inj,Quad PF,6+ Mos 07/11/2013, 08/04/2016  . Influenza-Unspecified 10/04/2012  . Moderna SARS-COVID-2 Vaccination 10/30/2019, 11/20/2019  . Pneumococcal Conjugate-13 02/19/2015  . Pneumococcal Polysaccharide-23 03/04/2017  . Pneumococcal-Unspecified 10/05/2011  . Td 10/05/2007  . Tdap 06/26/2019  . Zoster 07/01/2012   Health Maintenance Health Maintenance  Topic Date Due  . DEXA SCAN  Never done  . TETANUS/TDAP  06/25/2029  . INFLUENZA VACCINE  Completed  . COVID-19 Vaccine  Completed  . PNA vac Low Risk Adult  Completed    Dental Screening: Recommended annual dental exams for proper oral hygiene.  Community Resource Referral / Chronic Care Management: CRR required this visit?  No   CCM required this visit?  No      Plan:   Keep all routine maintenance appointments.   I have personally reviewed and noted the following in the patient's chart:   . Medical and social history . Use of alcohol, tobacco or illicit drugs  . Current medications and supplements . Functional ability and status . Nutritional  status . Physical activity . Advanced directives . List of other physicians . Hospitalizations, surgeries, and ER visits in previous 12 months . Vitals . Screenings to include cognitive, depression, and falls . Referrals and appointments  In addition, I have reviewed and discussed with patient certain preventive protocols, quality metrics, and best practice recommendations. A written personalized care plan for preventive services as well as general preventive health recommendations were provided to patient via mail.     Sherlene Shams, MD   06/28/2020     I have reviewed the above information and agree with above.   Duncan Dull, MD

## 2020-06-23 NOTE — Patient Instructions (Addendum)
Tiffany Benjamin , Thank you for taking time to come for your Medicare Wellness Visit. I appreciate your ongoing commitment to your health goals. Please review the following plan we discussed and let me know if I can assist you in the future.   These are the goals we discussed: Goals    . LIFESTYLE - DECREASE FALLS RISK     Do not run       This is a list of the screening recommended for you and due dates:  Health Maintenance  Topic Date Due  . DEXA scan (bone density measurement)  Never done  . Flu Shot  05/04/2020  . Tetanus Vaccine  06/25/2029  . COVID-19 Vaccine  Completed  . Pneumonia vaccines  Completed    Immunizations Immunization History  Administered Date(s) Administered  . Fluad Quad(high Dose 65+) 06/19/2019  . Influenza, High Dose Seasonal PF 11/25/2015, 06/07/2018  . Influenza,inj,Quad PF,6+ Mos 07/11/2013, 08/04/2016  . Influenza-Unspecified 10/04/2012  . Moderna SARS-COVID-2 Vaccination 10/30/2019, 11/20/2019  . Pneumococcal Conjugate-13 02/19/2015  . Pneumococcal Polysaccharide-23 03/04/2017  . Pneumococcal-Unspecified 10/05/2011  . Td 10/05/2007  . Tdap 06/26/2019  . Zoster 07/01/2012   Advanced directives: End of life planning; Advance aging; Advanced directives discussed.  Copy of current HCPOA/Living Will requested.    Conditions/risks identified: none new.   Follow up in one year for your annual wellness visit.   Preventive Care 33 Years and Older, Female Preventive care refers to lifestyle choices and visits with your health care provider that can promote health and wellness. What does preventive care include?  A yearly physical exam. This is also called an annual well check.  Dental exams once or twice a year.  Routine eye exams. Ask your health care provider how often you should have your eyes checked.  Personal lifestyle choices, including:  Daily care of your teeth and gums.  Regular physical activity.  Eating a healthy  diet.  Avoiding tobacco and drug use.  Limiting alcohol use.  Practicing safe sex.  Taking low-dose aspirin every day.  Taking vitamin and mineral supplements as recommended by your health care provider. What happens during an annual well check? The services and screenings done by your health care provider during your annual well check will depend on your age, overall health, lifestyle risk factors, and family history of disease. Counseling  Your health care provider may ask you questions about your:  Alcohol use.  Tobacco use.  Drug use.  Emotional well-being.  Home and relationship well-being.  Sexual activity.  Eating habits.  History of falls.  Memory and ability to understand (cognition).  Work and work Astronomer.  Reproductive health. Screening  You may have the following tests or measurements:  Height, weight, and BMI.  Blood pressure.  Lipid and cholesterol levels. These may be checked every 5 years, or more frequently if you are over 38 years old.  Skin check.  Lung cancer screening. You may have this screening every year starting at age 58 if you have a 30-pack-year history of smoking and currently smoke or have quit within the past 15 years.  Fecal occult blood test (FOBT) of the stool. You may have this test every year starting at age 24.  Flexible sigmoidoscopy or colonoscopy. You may have a sigmoidoscopy every 5 years or a colonoscopy every 10 years starting at age 90.  Hepatitis C blood test.  Hepatitis B blood test.  Sexually transmitted disease (STD) testing.  Diabetes screening. This is done by checking your  blood sugar (glucose) after you have not eaten for a while (fasting). You may have this done every 1-3 years.  Bone density scan. This is done to screen for osteoporosis. You may have this done starting at age 62.  Mammogram. This may be done every 1-2 years. Talk to your health care provider about how often you should have  regular mammograms. Talk with your health care provider about your test results, treatment options, and if necessary, the need for more tests. Vaccines  Your health care provider may recommend certain vaccines, such as:  Influenza vaccine. This is recommended every year.  Tetanus, diphtheria, and acellular pertussis (Tdap, Td) vaccine. You may need a Td booster every 10 years.  Zoster vaccine. You may need this after age 44.  Pneumococcal 13-valent conjugate (PCV13) vaccine. One dose is recommended after age 61.  Pneumococcal polysaccharide (PPSV23) vaccine. One dose is recommended after age 70. Talk to your health care provider about which screenings and vaccines you need and how often you need them. This information is not intended to replace advice given to you by your health care provider. Make sure you discuss any questions you have with your health care provider. Document Released: 10/17/2015 Document Revised: 06/09/2016 Document Reviewed: 07/22/2015 Elsevier Interactive Patient Education  2017 [employer].  Fall Prevention in the Home Falls can cause injuries. They can happen to people of all ages. There are many things you can do to make your home safe and to help prevent falls. What can I do on the outside of my home?  Regularly fix the edges of walkways and driveways and fix any cracks.  Remove anything that might make you trip as you walk through a door, such as a raised step or threshold.  Trim any bushes or trees on the path to your home.  Use bright outdoor lighting.  Clear any walking paths of anything that might make someone trip, such as rocks or tools.  Regularly check to see if handrails are loose or broken. Make sure that both sides of any steps have handrails.  Any raised decks and porches should have guardrails on the edges.  Have any leaves, snow, or ice cleared regularly.  Use sand or salt on walking paths during winter.  Clean up any spills in your  garage right away. This includes oil or grease spills. What can I do in the bathroom?  Use night lights.  Install grab bars by the toilet and in the tub and shower. Do not use towel bars as grab bars.  Use non-skid mats or decals in the tub or shower.  If you need to sit down in the shower, use a plastic, non-slip stool.  Keep the floor dry. Clean up any water that spills on the floor as soon as it happens.  Remove soap buildup in the tub or shower regularly.  Attach bath mats securely with double-sided non-slip rug tape.  Do not have throw rugs and other things on the floor that can make you trip. What can I do in the bedroom?  Use night lights.  Make sure that you have a light by your bed that is easy to reach.  Do not use any sheets or blankets that are too big for your bed. They should not hang down onto the floor.  Have a firm chair that has side arms. You can use this for support while you get dressed.  Do not have throw rugs and other things on the floor that  can make you trip. What can I do in the kitchen?  Clean up any spills right away.  Avoid walking on wet floors.  Keep items that you use a lot in easy-to-reach places.  If you need to reach something above you, use a strong step stool that has a grab bar.  Keep electrical cords out of the way.  Do not use floor polish or wax that makes floors slippery. If you must use wax, use non-skid floor wax.  Do not have throw rugs and other things on the floor that can make you trip. What can I do with my stairs?  Do not leave any items on the stairs.  Make sure that there are handrails on both sides of the stairs and use them. Fix handrails that are broken or loose. Make sure that handrails are as long as the stairways.  Check any carpeting to make sure that it is firmly attached to the stairs. Fix any carpet that is loose or worn.  Avoid having throw rugs at the top or bottom of the stairs. If you do have throw  rugs, attach them to the floor with carpet tape.  Make sure that you have a light switch at the top of the stairs and the bottom of the stairs. If you do not have them, ask someone to add them for you. What else can I do to help prevent falls?  Wear shoes that:  Do not have high heels.  Have rubber bottoms.  Are comfortable and fit you well.  Are closed at the toe. Do not wear sandals.  If you use a stepladder:  Make sure that it is fully opened. Do not climb a closed stepladder.  Make sure that both sides of the stepladder are locked into place.  Ask someone to hold it for you, if possible.  Clearly mark and make sure that you can see:  Any grab bars or handrails.  First and last steps.  Where the edge of each step is.  Use tools that help you move around (mobility aids) if they are needed. These include:  Canes.  Walkers.  Scooters.  Crutches.  Turn on the lights when you go into a dark area. Replace any light bulbs as soon as they burn out.  Set up your furniture so you have a clear path. Avoid moving your furniture around.  If any of your floors are uneven, fix them.  If there are any pets around you, be aware of where they are.  Review your medicines with your doctor. Some medicines can make you feel dizzy. This can increase your chance of falling. Ask your doctor what other things that you can do to help prevent falls. This information is not intended to replace advice given to you by your health care provider. Make sure you discuss any questions you have with your health care provider. Document Released: 07/17/2009 Document Revised: 02/26/2016 Document Reviewed: 10/25/2014 Elsevier Interactive Patient Education  2017 [employer].

## 2020-06-24 ENCOUNTER — Other Ambulatory Visit: Payer: Self-pay

## 2020-06-27 ENCOUNTER — Encounter: Payer: Self-pay | Admitting: Internal Medicine

## 2020-06-27 ENCOUNTER — Other Ambulatory Visit: Payer: Self-pay

## 2020-06-27 ENCOUNTER — Ambulatory Visit (INDEPENDENT_AMBULATORY_CARE_PROVIDER_SITE_OTHER): Payer: Medicare PPO | Admitting: Internal Medicine

## 2020-06-27 VITALS — BP 180/82 | HR 64 | Temp 98.4°F | Ht 61.5 in | Wt 181.6 lb

## 2020-06-27 DIAGNOSIS — I10 Essential (primary) hypertension: Secondary | ICD-10-CM | POA: Diagnosis not present

## 2020-06-27 DIAGNOSIS — R944 Abnormal results of kidney function studies: Secondary | ICD-10-CM | POA: Diagnosis not present

## 2020-06-27 DIAGNOSIS — Z23 Encounter for immunization: Secondary | ICD-10-CM

## 2020-06-27 DIAGNOSIS — N1832 Chronic kidney disease, stage 3b: Secondary | ICD-10-CM

## 2020-06-27 NOTE — Patient Instructions (Addendum)
Your blood pressure is way too high!!  I want you go go buy a new blood pressure monitor ASAP and start checking your readings at home  Return next week WITH your machine and BRING your readings   YOUR KIDNEYS are not working very well.  If today's blood test is abnormal I will recommend that you see a kidney specialist

## 2020-06-27 NOTE — Progress Notes (Signed)
Subjective:  Patient ID: Tiffany Benjamin, female    DOB: 07/07/1933  Age: 86 y.o. MRN: [id number]  CC: The primary encounter diagnosis was Need for immunization against influenza. Diagnoses of Decreased GFR and Essential hypertension were also pertinent to this visit.  HPI Tiffany Benjamin presents for follow up on hypertension, hyperlipidemia  MCI and GAD   This visit occurred during the SARS-CoV-2 public health emergency.  Safety protocols were in place, including screening questions prior to the visit, additional usage of staff PPE, and extensive cleaning of exam room while observing appropriate contact time as indicated for disinfecting solutions.   Patient states that she  is taking her medications as prescribed and notes no adverse effects.  Home BP readings have not been done .  She is avoiding added salt in her diet and walking regularly about 3 times per week for exercise  .      Outpatient Medications Prior to Visit  Medication Sig Dispense Refill  . amLODipine (NORVASC) 10 MG tablet TAKE 1 TABLET (10 MG TOTAL) BY MOUTH DAILY. IN THE MORNING FOR HYPERTENSION 90 tablet 1  . Ascorbic Acid (VITAMIN C PO) Take 1 tablet by mouth daily.    Marland Kitchen aspirin 81 MG tablet Take 81 mg by mouth daily.    Marland Kitchen BIOTIN PO Take by mouth daily.    . busPIRone (BUSPAR) 10 MG tablet TAKE 1 TABLET BY MOUTH THREE TIMES A DAY 270 tablet 1  . Cyanocobalamin (VITAMIN B-12 PO) Take by mouth daily.    . hydrochlorothiazide (HYDRODIURIL) 25 MG tablet Take 1 tablet (25 mg total) by mouth daily. In the morning for hypertension 90 tablet 3  . losartan (COZAAR) 100 MG tablet TAKE 1 TABLET (100 MG TOTAL) BY MOUTH DAILY. IN THE EVENING FOR HYPERTENSION 90 tablet 1  . metoprolol succinate (TOPROL-XL) 50 MG 24 hr tablet TAKE 1 TABLET (50 MG TOTAL) BY MOUTH AT BEDTIME. FOR HYPERTENSION 90 tablet 1  . Multiple Vitamins-Minerals (CENTRUM SILVER PO) Take by mouth daily.    . Omega 3 1000 MG CAPS 1 capsule twice daily with  meals 360 each 3  . simvastatin (ZOCOR) 20 MG tablet TAKE 1 TABLET BY MOUTH EVERY DAY IN THE EVENING 90 tablet 1  . traMADol (ULTRAM) 50 MG tablet TAKE 1 TABLET (50 MG TOTAL) BY MOUTH EVERY 6 (SIX) HOURS AS NEEDED. MAXIMUM 4 TABLETS DAILY 120 tablet 0  . VITAMIN D, CHOLECALCIFEROL, PO Take 5,000 mg by mouth daily.    Marland Kitchen VITAMIN E PO Take 1 capsule by mouth daily.     Facility-Administered Medications Prior to Visit  Medication Dose Route Frequency Provider Last Rate Last Admin  . cloNIDine (CATAPRES) tablet 0.1 mg  0.1 mg Oral Once Sherlene Shams, MD        Review of Systems;  Patient denies headache, fevers, malaise, unintentional weight loss, skin rash, eye pain, sinus congestion and sinus pain, sore throat, dysphagia,  hemoptysis , cough, dyspnea, wheezing, chest pain, palpitations, orthopnea, edema, abdominal pain, nausea, melena, diarrhea, constipation, flank pain, dysuria, hematuria, urinary  Frequency, nocturia, numbness, tingling, seizures,  Focal weakness, Loss of consciousness,  Tremor, insomnia, depression, anxiety, and suicidal ideation.      Objective:  BP (!) 180/82 (BP Location: Left Arm, Patient Position: Sitting)   Pulse 64   Temp 98.4 F (36.9 C)   Ht 5' 1.5" (1.562 m)   Wt 181 lb 9.6 oz (82.4 kg)   SpO2 98%   BMI 33.76 kg/m  BP Readings from Last 3 Encounters:  06/27/20 (!) 180/82  02/07/20 (!) 142/94  12/31/19 (!) 167/67    Wt Readings from Last 3 Encounters:  06/27/20 181 lb 9.6 oz (82.4 kg)  06/23/20 179 lb (81.2 kg)  02/07/20 179 lb 9.6 oz (81.5 kg)    General appearance: alert, cooperative and appears stated age Ears: normal TM's and external ear canals both ears Throat: lips, mucosa, and tongue normal; teeth and gums normal Neck: no adenopathy, no carotid bruit, supple, symmetrical, trachea midline and thyroid not enlarged, symmetric, no tenderness/mass/nodules Back: symmetric, no curvature. ROM normal. No CVA tenderness. Lungs: clear to  auscultation bilaterally Heart: regular rate and rhythm, S1, S2 normal, no murmur, click, rub or gallop Abdomen: soft, non-tender; bowel sounds normal; no masses,  no organomegaly Pulses: 2+ and symmetric Skin: Skin color, texture, turgor normal. No rashes or lesions Lymph nodes: Cervical, supraclavicular, and axillary nodes normal.  Lab Results  Component Value Date   HGBA1C 5.7 02/07/2020   HGBA1C 5.9 06/26/2019   HGBA1C 5.7 06/02/2018    Lab Results  Component Value Date   CREATININE 1.42 (H) 06/27/2020   CREATININE 1.52 (H) 02/07/2020   CREATININE 1.24 (H) 06/26/2019    Lab Results  Component Value Date   WBC 7.2 06/26/2019   HGB 12.7 06/26/2019   HCT 37.8 06/26/2019   PLT 269.0 06/26/2019   GLUCOSE 94 06/27/2020   CHOL 167 02/07/2020   TRIG 113.0 02/07/2020   HDL 67.00 02/07/2020   LDLDIRECT 81.0 12/09/2016   LDLCALC 78 02/07/2020   ALT 13 02/07/2020   AST 19 02/07/2020   NA 140 06/27/2020   K 4.6 06/27/2020   CL 103 06/27/2020   CREATININE 1.42 (H) 06/27/2020   BUN 26 (H) 06/27/2020   CO2 29 06/27/2020   TSH 1.26 06/26/2019   HGBA1C 5.7 02/07/2020   MICROALBUR 10.3 (H) 06/02/2018    No results found.  Assessment & Plan:   Problem List Items Addressed This Visit      Unprioritized   Hypertension    Remains poorly controlled. Adherence suspected to be poor due to cognitive issues. .  She needs to return for BP check with RN.  No changes to current regimen as she is on maximal doses of losartan, amlodipine, hctz and metoprolol.        Other Visit Diagnoses    Need for immunization against influenza    -  Primary   Relevant Orders   Flu Vaccine QUAD High Dose(Fluad) (Completed)   Decreased GFR       Relevant Orders   PTH, Intact and Calcium   Renal function panel (Completed)      I am having Tiffany Benjamin maintain her aspirin, Omega 3, (VITAMIN D, CHOLECALCIFEROL, PO), Ascorbic Acid (VITAMIN C PO), VITAMIN E PO, hydrochlorothiazide,  busPIRone, Cyanocobalamin (VITAMIN B-12 PO), BIOTIN PO, Multiple Vitamins-Minerals (CENTRUM SILVER PO), losartan, metoprolol succinate, traMADol, amLODipine, and simvastatin. We will continue to administer cloNIDine.  No orders of the defined types were placed in this encounter.   There are no discontinued medications.  Follow-up: Return in about 3 months (around 09/26/2020).   Sherlene Shams, MD

## 2020-06-29 DIAGNOSIS — N1832 Chronic kidney disease, stage 3b: Secondary | ICD-10-CM | POA: Insufficient documentation

## 2020-06-29 HISTORY — DX: Chronic kidney disease, stage 3b: N18.32

## 2020-06-29 NOTE — Assessment & Plan Note (Signed)
Likely secondary to hypertension kidney disease.  pth and calcium are normal

## 2020-06-29 NOTE — Assessment & Plan Note (Signed)
Remains poorly controlled. Adherence suspected to be poor due to cognitive issues. .  She needs to return for BP check with RN.  No changes to current regimen as she is on maximal doses of losartan, amlodipine, hctz and metoprolol.

## 2020-06-29 NOTE — Progress Notes (Signed)
your kidney function is still low but stable and has not changed over the past 2 years.  Continue to avoid using motrin and aleve on a regular basis as theses can harm kidney function with regular use. Return for RN visit with your home machine to check BP

## 2020-06-30 LAB — RENAL FUNCTION PANEL
Albumin: 4.2 g/dL (ref 3.6–5.1)
BUN/Creatinine Ratio: 18 (calc) (ref 6–22)
BUN: 26 mg/dL — ABNORMAL HIGH (ref 7–25)
CO2: 29 mmol/L (ref 20–32)
Calcium: 9.2 mg/dL (ref 8.6–10.4)
Chloride: 103 mmol/L (ref 98–110)
Creat: 1.42 mg/dL — ABNORMAL HIGH (ref 0.60–0.88)
Glucose, Bld: 94 mg/dL (ref 65–99)
Phosphorus: 3.5 mg/dL (ref 2.1–4.3)
Potassium: 4.6 mmol/L (ref 3.5–5.3)
Sodium: 140 mmol/L (ref 135–146)

## 2020-06-30 LAB — PTH, INTACT AND CALCIUM
Calcium: 9.2 mg/dL (ref 8.6–10.4)
PTH: 59 pg/mL (ref 14–64)

## 2020-07-10 ENCOUNTER — Telehealth: Payer: Self-pay

## 2020-07-10 ENCOUNTER — Ambulatory Visit: Payer: Medicare PPO

## 2020-07-10 NOTE — Telephone Encounter (Signed)
Pt canceled her bp check today-she states that she just bought a new machine and her BP is 140/80 and HR is 78. FYI

## 2020-07-10 NOTE — Telephone Encounter (Signed)
FYI

## 2020-08-04 ENCOUNTER — Other Ambulatory Visit: Payer: Self-pay | Admitting: Internal Medicine

## 2020-08-11 ENCOUNTER — Ambulatory Visit: Payer: Medicare PPO | Admitting: Internal Medicine

## 2020-08-11 ENCOUNTER — Other Ambulatory Visit: Payer: Self-pay

## 2020-08-11 ENCOUNTER — Other Ambulatory Visit: Payer: Self-pay | Admitting: Internal Medicine

## 2020-08-11 ENCOUNTER — Encounter: Payer: Self-pay | Admitting: Internal Medicine

## 2020-08-11 VITALS — BP 140/78 | HR 67 | Temp 98.1°F | Resp 16 | Ht 61.5 in | Wt 176.8 lb

## 2020-08-11 DIAGNOSIS — N1832 Chronic kidney disease, stage 3b: Secondary | ICD-10-CM

## 2020-08-11 DIAGNOSIS — R5383 Other fatigue: Secondary | ICD-10-CM | POA: Diagnosis not present

## 2020-08-11 DIAGNOSIS — I10 Essential (primary) hypertension: Secondary | ICD-10-CM | POA: Diagnosis not present

## 2020-08-11 LAB — CBC WITH DIFFERENTIAL/PLATELET
Basophils Absolute: 0 10*3/uL (ref 0.0–0.1)
Basophils Relative: 0.5 % (ref 0.0–3.0)
Eosinophils Absolute: 0.2 10*3/uL (ref 0.0–0.7)
Eosinophils Relative: 2.9 % (ref 0.0–5.0)
HCT: 38.2 % (ref 36.0–46.0)
Hemoglobin: 12.8 g/dL (ref 12.0–15.0)
Lymphocytes Relative: 38 % (ref 12.0–46.0)
Lymphs Abs: 2.4 10*3/uL (ref 0.7–4.0)
MCHC: 33.4 g/dL (ref 30.0–36.0)
MCV: 97.2 fl (ref 78.0–100.0)
Monocytes Absolute: 0.8 10*3/uL (ref 0.1–1.0)
Monocytes Relative: 12 % (ref 3.0–12.0)
Neutro Abs: 3 10*3/uL (ref 1.4–7.7)
Neutrophils Relative %: 46.6 % (ref 43.0–77.0)
Platelets: 243 10*3/uL (ref 150.0–400.0)
RBC: 3.93 Mil/uL (ref 3.87–5.11)
RDW: 14.3 % (ref 11.5–15.5)
WBC: 6.4 10*3/uL (ref 4.0–10.5)

## 2020-08-11 LAB — COMPREHENSIVE METABOLIC PANEL
ALT: 15 U/L (ref 0–35)
AST: 18 U/L (ref 0–37)
Albumin: 4.1 g/dL (ref 3.5–5.2)
Alkaline Phosphatase: 71 U/L (ref 39–117)
BUN: 24 mg/dL — ABNORMAL HIGH (ref 6–23)
CO2: 31 mEq/L (ref 19–32)
Calcium: 9.3 mg/dL (ref 8.4–10.5)
Chloride: 102 mEq/L (ref 96–112)
Creatinine, Ser: 1.52 mg/dL — ABNORMAL HIGH (ref 0.40–1.20)
GFR: 31.04 mL/min — ABNORMAL LOW (ref >60.00)
Glucose, Bld: 94 mg/dL (ref 70–99)
Potassium: 4.7 mEq/L (ref 3.5–5.1)
Sodium: 141 mEq/L (ref 135–145)
Total Bilirubin: 0.5 mg/dL (ref 0.2–1.2)
Total Protein: 6.8 g/dL (ref 6.0–8.3)

## 2020-08-11 LAB — VITAMIN B12: Vitamin B-12: >1526 pg/mL — ABNORMAL HIGH (ref 211–911)

## 2020-08-11 LAB — TSH: TSH: 0.87 u[IU]/mL (ref 0.35–4.50)

## 2020-08-11 MED ORDER — ZOSTER VAC RECOMB ADJUVANTED 50 MCG/0.5ML IM SUSR: 0.5000 mL | Freq: Once | INTRAMUSCULAR | 1 refills | Status: AC

## 2020-08-11 NOTE — Patient Instructions (Signed)
YOU ARE UP TO DATE ON ALL VACCINES EXCEPT THE SHINGLES VACCINE (SEE BELOW)   I AM CHECKING YOUR VITAMIN AND IRON LEVELS TO RULE OUT DEFICIENCIES AS A REASON FOR YOUR FATIGUE  I RECOMMEND THAT YOU INCREASE YOUR ACTIVITY TO TWO WALKS DAILY    The ShingRx vaccine is now available in local pharmacies and is much more protective than the old one  Zostavax  (it is about 97%  Effective in preventing shingles). .   It is therefore ADVISED for all interested adults over 50 to prevent shingles so I have printed you a prescription for it.  (it requires a 2nd dose 2 too 6 months after the first one) .  It will cause you to have flu  like symptoms for 2 days

## 2020-08-11 NOTE — Progress Notes (Signed)
Subjective:  Patient ID: Tiffany Benjamin, female    DOB: Jun 13, 1933  Age: 86 y.o. MRN: [id number]  CC: The primary encounter diagnosis was Fatigue, unspecified type. Diagnoses of Elevated blood pressure reading in office with white coat syndrome, with diagnosis of hypertension and Chronic kidney disease, stage 3b (HCC) were also pertinent to this visit.  HPI FAYTH TREFRY presents for  Follow up on uncontrolled hypertension , CKD  Chronic knee pain , and MCI   This visit occurred during the SARS-CoV-2 public health emergency.  Safety protocols were in place, including screening questions prior to the visit, additional usage of staff PPE, and extensive cleaning of exam room while observing appropriate contact time as indicated for disinfecting solutions.    Patient has received both doses of the available COVID 19 vaccine without complications.  Patient continues to mask when outside of the home except when walking in yard or at safe distances from others .  Patient denies any change in mood or development of unhealthy behaviors resuting from the pandemic's restriction of activities and socialization.     Cc fatigue.  2-3 weeks ago.  admits that she has a sedentary lifestyle since COVID started,  No longer baby sitting.  Walking every day in her neighborhood .  Denies short of breath and chest pain .  Sleeps great.  Watches tv most days  All day long.  Falls  Asleep during the day.  Sleeps from midnight to 9:00 am.    HTN:  Her  BP has never been at goal during office visits .  She was scheduled for an RN visit 3 weeks ago  But cancelled it bc her new BP machine at home  And is checking it twice daily  Home readings have been typically 140/80.  The  lowest  was 130/70   Medications reviewed and compliance assessed via patient report.      Lab Results  Component Value Date   MICROALBUR 0.4 08/19/2014   Lab Results  Component Value Date   CREATININE 0.74 11/25/2014   Lab Results   Component Value Date   NA 137 11/25/2014   K 4.4 11/25/2014   CL 103 11/25/2014   CO2 28 11/25/2014     the following changes were made to his regimen:  Outpatient Medications Prior to Visit  Medication Sig Dispense Refill  . amLODipine (NORVASC) 10 MG tablet TAKE 1 TABLET (10 MG TOTAL) BY MOUTH DAILY. IN THE MORNING FOR HYPERTENSION 90 tablet 1  . Ascorbic Acid (VITAMIN C PO) Take 1 tablet by mouth daily.    Marland Kitchen aspirin 81 MG tablet Take 81 mg by mouth daily.    Marland Kitchen BIOTIN PO Take by mouth daily.    . busPIRone (BUSPAR) 10 MG tablet TAKE 1 TABLET BY MOUTH THREE TIMES A DAY 270 tablet 1  . Cyanocobalamin (VITAMIN B-12 PO) Take by mouth daily.    . hydrochlorothiazide (HYDRODIURIL) 25 MG tablet Take 1 tablet (25 mg total) by mouth daily. In the morning for hypertension 90 tablet 3  . losartan (COZAAR) 100 MG tablet TAKE 1 TABLET (100 MG TOTAL) BY MOUTH DAILY. IN THE EVENING FOR HYPERTENSION 90 tablet 1  . metoprolol succinate (TOPROL-XL) 50 MG 24 hr tablet TAKE 1 TABLET (50 MG TOTAL) BY MOUTH AT BEDTIME. FOR HYPERTENSION 90 tablet 1  . Multiple Vitamins-Minerals (CENTRUM SILVER PO) Take by mouth daily.    . Omega 3 1000 MG CAPS 1 capsule twice daily with meals 360 each  3  . simvastatin (ZOCOR) 20 MG tablet TAKE 1 TABLET BY MOUTH EVERY DAY IN THE EVENING 90 tablet 1  . traMADol (ULTRAM) 50 MG tablet TAKE 1 TABLET (50 MG TOTAL) BY MOUTH EVERY 6 (SIX) HOURS AS NEEDED. MAXIMUM 4 TABLETS DAILY 120 tablet 0  . VITAMIN D, CHOLECALCIFEROL, PO Take 5,000 mg by mouth daily.    Marland Kitchen VITAMIN E PO Take 1 capsule by mouth daily.     Facility-Administered Medications Prior to Visit  Medication Dose Route Frequency Provider Last Rate Last Admin  . cloNIDine (CATAPRES) tablet 0.1 mg  0.1 mg Oral Once Sherlene Shams, MD        Review of Systems;  Patient denies headache, fevers, malaise, unintentional weight loss, skin rash, eye pain, sinus congestion and sinus pain, sore throat, dysphagia,  hemoptysis ,  cough, dyspnea, wheezing, chest pain, palpitations, orthopnea, edema, abdominal pain, nausea, melena, diarrhea, constipation, flank pain, dysuria, hematuria, urinary  Frequency, nocturia, numbness, tingling, seizures,  Focal weakness, Loss of consciousness,  Tremor, insomnia, depression, anxiety, and suicidal ideation.      Objective:  BP 140/78 (BP Location: Left Arm, Patient Position: Sitting, Cuff Size: Large)   Pulse 67   Temp 98.1 F (36.7 C) (Oral)   Resp 16   Ht 5' 1.5" (1.562 m)   Wt 176 lb 12.8 oz (80.2 kg)   SpO2 97%   BMI 32.87 kg/m   BP Readings from Last 3 Encounters:  08/11/20 140/78  06/27/20 (!) 180/82  02/07/20 (!) 142/94    Wt Readings from Last 3 Encounters:  08/11/20 176 lb 12.8 oz (80.2 kg)  06/27/20 181 lb 9.6 oz (82.4 kg)  06/23/20 179 lb (81.2 kg)    General appearance: alert, cooperative and appears stated age Ears: normal TM's and external ear canals both ears Throat: lips, mucosa, and tongue normal; teeth and gums normal Neck: no adenopathy, no carotid bruit, supple, symmetrical, trachea midline and thyroid not enlarged, symmetric, no tenderness/mass/nodules Back: symmetric, no curvature. ROM normal. No CVA tenderness. Lungs: clear to auscultation bilaterally Heart: regular rate and rhythm, S1, S2 normal, no murmur, click, rub or gallop Abdomen: soft, non-tender; bowel sounds normal; no masses,  no organomegaly Pulses: 2+ and symmetric Skin: Skin color, texture, turgor normal. No rashes or lesions Lymph nodes: Cervical, supraclavicular, and axillary nodes normal.  Lab Results  Component Value Date   HGBA1C 5.7 02/07/2020   HGBA1C 5.9 06/26/2019   HGBA1C 5.7 06/02/2018    Lab Results  Component Value Date   CREATININE 1.52 (H) 08/11/2020   CREATININE 1.42 (H) 06/27/2020   CREATININE 1.52 (H) 02/07/2020    Lab Results  Component Value Date   WBC 6.4 08/11/2020   HGB 12.8 08/11/2020   HCT 38.2 08/11/2020   PLT 243.0 08/11/2020    GLUCOSE 94 08/11/2020   CHOL 167 02/07/2020   TRIG 113.0 02/07/2020   HDL 67.00 02/07/2020   LDLDIRECT 81.0 12/09/2016   LDLCALC 78 02/07/2020   ALT 15 08/11/2020   AST 18 08/11/2020   NA 141 08/11/2020   K 4.7 08/11/2020   CL 102 08/11/2020   CREATININE 1.52 (H) 08/11/2020   BUN 24 (H) 08/11/2020   CO2 31 08/11/2020   TSH 0.87 08/11/2020   HGBA1C 5.7 02/07/2020   MICROALBUR 10.3 (H) 06/02/2018    No results found.  Assessment & Plan:   Problem List Items Addressed This Visit      Unprioritized   Chronic kidney disease, stage 3b (HCC)  Likely secondary to hypertension kidney disease.  pth and calcium are normal   Lab Results  Component Value Date   PTH 59 06/27/2020   CALCIUM 9.3 08/11/2020   CAION 1.07 (L) 03/19/2012   PHOS 3.5 06/27/2020   Lab Results  Component Value Date   CREATININE 1.52 (H) 08/11/2020   Lab Results  Component Value Date   NA 141 08/11/2020   K 4.7 08/11/2020   CL 102 08/11/2020   CO2 31 08/11/2020         Elevated blood pressure reading in office with white coat syndrome, with diagnosis of hypertension    Home readings on a calibrated machine have been done daily and are normal on current 4 drug  regimen       Fatigue - Primary    Screening labs normal.  No history of snoring.  Recommended participating in regular exercise program with goal of achieving a minimum of 30 minutes of aerobic activity 5 days per week.   Lab Results  Component Value Date   IRON 105 08/11/2020   TIBC 286 08/11/2020   FERRITIN 167 08/11/2020     Lab Results  Component Value Date   CREATININE 0.79 11/03/2015   Lab Results  Component Value Date   ALT 14 11/03/2015   AST 18 11/03/2015   ALKPHOS 69 11/03/2015   BILITOT 0.6 11/03/2015   Lab Results  Component Value Date   TSH 1.68 11/03/2015   Lab Results  Component Value Date   WBC 8.6 11/03/2015   HGB 14.7 11/03/2015   HCT 43.8 11/03/2015   MCV 92.7 11/03/2015   PLT 248.0 11/03/2015     Lab Results  Component Value Date   TSH 0.87 08/11/2020   Lab Results  Component Value Date   VITAMINB12 >1526 (H) 08/11/2020   Lab Results  Component Value Date   WBC 6.4 08/11/2020   HGB 12.8 08/11/2020   HCT 38.2 08/11/2020   MCV 97.2 08/11/2020   PLT 243.0 08/11/2020   Lab Results  Component Value Date   CREATININE 1.52 (H) 08/11/2020         Relevant Orders   CBC with Differential/Platelet (Completed)   Comprehensive metabolic panel (Completed)   TSH (Completed)   Iron, TIBC and Ferritin Panel (Completed)   Vitamin B12 (Completed)   RBC Folate      I am having Leoda M. Castor start on Zoster Vaccine Adjuvanted. I am also having her maintain her aspirin, Omega 3, (VITAMIN D, CHOLECALCIFEROL, PO), Ascorbic Acid (VITAMIN C PO), VITAMIN E PO, hydrochlorothiazide, busPIRone, Cyanocobalamin (VITAMIN B-12 PO), BIOTIN PO, Multiple Vitamins-Minerals (CENTRUM SILVER PO), traMADol, amLODipine, simvastatin, metoprolol succinate, and losartan. We will continue to administer cloNIDine.  Meds ordered this encounter  Medications  . Zoster Vaccine Adjuvanted [facility]) injection    Sig: Inject 0.5 mLs into the muscle once for 1 dose.    Dispense:  1 each    Refill:  1    There are no discontinued medications.  Follow-up: No follow-ups on file.   Sherlene Shams, MD

## 2020-08-11 NOTE — Assessment & Plan Note (Signed)
Home readings on a calibrated machine have been done daily and are normal on current 4 drug  regimen

## 2020-08-12 ENCOUNTER — Telehealth: Payer: Self-pay

## 2020-08-12 DIAGNOSIS — R5383 Other fatigue: Secondary | ICD-10-CM

## 2020-08-12 HISTORY — DX: Other fatigue: R53.83

## 2020-08-12 LAB — FOLATE RBC: RBC Folate: 1011 ng/mL RBC (ref >280)

## 2020-08-12 LAB — IRON,TIBC AND FERRITIN PANEL
%SAT: 37 % (calc) (ref 16–45)
Ferritin: 167 ng/mL (ref 16–288)
Iron: 105 ug/dL (ref 45–160)
TIBC: 286 mcg/dL (calc) (ref 250–450)

## 2020-08-12 NOTE — Assessment & Plan Note (Signed)
Likely secondary to hypertension kidney disease.  pth and calcium are normal   Lab Results  Component Value Date   PTH 59 06/27/2020   CALCIUM 9.3 08/11/2020   CAION 1.07 (L) 03/19/2012   PHOS 3.5 06/27/2020   Lab Results  Component Value Date   CREATININE 1.52 (H) 08/11/2020   Lab Results  Component Value Date   NA 141 08/11/2020   K 4.7 08/11/2020   CL 102 08/11/2020   CO2 31 08/11/2020

## 2020-08-12 NOTE — Progress Notes (Signed)
Your Labs including thyroid are all normal.  Nothing to suggest a cause for your profound fatigue.  Fatigue is much more common in the winter months for various reasons including diet,  decreased activity,  And sometimes can be due to decreased sunlight leading to seasonal affective disorder , which causes depression .  We discussed starting a walking program, i would recommend doing it in the late morning or early afternoon during the winter months to boost your sunlight exposure

## 2020-08-12 NOTE — Telephone Encounter (Signed)
LMTCB in regards to lab results.  

## 2020-08-12 NOTE — Assessment & Plan Note (Addendum)
Screening labs normal.  No history of snoring.  Recommended participating in regular exercise program with goal of achieving a minimum of 30 minutes of aerobic activity 5 days per week.   Lab Results  Component Value Date   IRON 105 08/11/2020   TIBC 286 08/11/2020   FERRITIN 167 08/11/2020     Lab Results  Component Value Date   CREATININE 0.79 11/03/2015   Lab Results  Component Value Date   ALT 14 11/03/2015   AST 18 11/03/2015   ALKPHOS 69 11/03/2015   BILITOT 0.6 11/03/2015   Lab Results  Component Value Date   TSH 1.68 11/03/2015   Lab Results  Component Value Date   WBC 8.6 11/03/2015   HGB 14.7 11/03/2015   HCT 43.8 11/03/2015   MCV 92.7 11/03/2015   PLT 248.0 11/03/2015    Lab Results  Component Value Date   TSH 0.87 08/11/2020   Lab Results  Component Value Date   VITAMINB12 >1526 (H) 08/11/2020   Lab Results  Component Value Date   WBC 6.4 08/11/2020   HGB 12.8 08/11/2020   HCT 38.2 08/11/2020   MCV 97.2 08/11/2020   PLT 243.0 08/11/2020   Lab Results  Component Value Date   CREATININE 1.52 (H) 08/11/2020

## 2020-08-18 ENCOUNTER — Telehealth: Payer: Self-pay

## 2020-08-18 NOTE — Telephone Encounter (Signed)
Left message for patient to return call back.    [facility] - Cl TELEPHONE ADVICE RECORD [facility] Patient Name: Tiffany Benjamin Gender: Female DOB: June 30, 1933 Age: 86 Y 8 M 19 D Return Phone Number: [phone] (Primary) [place] [address] Client [facility] Client Site  [facility] - Night Physician Duncan Dull - MD Contact Type Call Who Is Calling Patient / Member / Family / Caregiver Call Type Triage / Clinical Relationship To Patient Self Return Phone Number [phone] (Primary) Chief Complaint Earache Reason for Call Request to Speak to a Physician Initial Comment Caller states she has an ear ache Translation No Nurse Assessment Nurse: Tonye Becket, RN, Inetta Fermo Date/Time (Eastern Time): 08/16/2020 9:53:30 AM Confirm and document reason for call. If symptomatic, describe symptoms. ---Caller states she has an ear ache. Spoke with caller who stated that left ear started hurting last night. Reports a constant discomfort stated that "it doesn't feel right". Denies any other sx. Does the patient have any new or worsening symptoms? ---Yes Will a triage be completed? ---Yes Related visit to physician within the last 2 weeks? ---No Does the PT have any chronic conditions? (i.e. diabetes, asthma, this includes High risk factors for pregnancy, etc.) ---Yes List chronic conditions. ---HTN Is this a behavioral health or substance abuse call? ---No Guidelines Guideline Title Affirmed Question Affirmed Notes Nurse Date/Time (Eastern Time) Earache Earache (Exceptions: brief ear pain of < 60 minutes duration, earache occurring during air travel Hinckley, RN, Tina 08/16/2020 9:56:17 AM Disp. Time Lamount Cohen Time) Disposition Final User 08/16/2020 10:02:52 AM See PCP within 24 Hours Yes Tonye Becket, RN, Ronald Pippins Disagree/Comply Disagree Caller Understands  Yes PLEASE NOTE: All timestamps contained within this report are represented as [place] Standard Time. CONFIDENTIALTY NOTICE: This fax transmission is intended only for the addressee. It contains information that is legally privileged, confidential or otherwise protected from use or disclosure. If you are not the intended recipient, you are strictly prohibited from reviewing, disclosing, copying using or disseminating any of this information or taking any action in reliance on or regarding this information. If you have received this fax in error, please notify us immediately by telephone so that we can arrange for its return to [place]. Phone: [phone], Toll-Free: [phone], Fax: [phone] Page: 2 of 2 Call Id: [id number] PreDisposition Call Doctor Care Advice Given Per Guideline SEE PCP WITHIN 24 HOURS: * IF OFFICE WILL BE OPEN: You need to be examined within the next 24 hours. Call your doctor (or NP/PA) when the office opens and make an appointment. PAIN MEDICINES: * For pain relief, you can take either acetaminophen, ibuprofen, or naproxen. * ACETAMINOPHEN - EXTRA STRENGTH TYLENOL: Take 1,000 mg (two 500 mg pills) every 8 hours as needed. Each Extra Strength Tylenol pill has 500 mg of acetaminophen. The most you should take each day is 3,000 mg (6 pills a day). LOCAL COLD: * Apply a cold pack or a cold wet washcloth to outer ear for 20 min. to reduce pain while medicine takes effect (Note: some adults prefer local heat for 20 minutes) * Avoid plugging with cotton. (Reason: retained pus can cause infection of the lining of the ear canal) * Wipe the discharge away as it appears. EAR DISCHARGE: CALL BACK IF: * Severe pain persists over 2 hours after pain medicine * You become worse Referrals GO TO FACILITY REFUSED

## 2020-08-18 NOTE — Telephone Encounter (Signed)
Spoken to patient. She stated the earache has gone away she feels better.

## 2020-08-18 NOTE — Telephone Encounter (Signed)
Spoke with pt and she stated that the ear ache is gone. Pt does not need an appt at this time.

## 2020-08-18 NOTE — Telephone Encounter (Signed)
Pt called returning your call 

## 2020-08-31 ENCOUNTER — Other Ambulatory Visit: Payer: Self-pay | Admitting: Internal Medicine

## 2020-09-01 NOTE — Telephone Encounter (Signed)
LS:08-11-20 LO:06-02-20

## 2020-10-01 ENCOUNTER — Ambulatory Visit: Payer: Medicare PPO | Admitting: Internal Medicine

## 2020-10-01 DIAGNOSIS — Z0289 Encounter for other administrative examinations: Secondary | ICD-10-CM

## 2020-10-13 ENCOUNTER — Encounter: Payer: Self-pay | Admitting: Internal Medicine

## 2020-10-13 ENCOUNTER — Telehealth (INDEPENDENT_AMBULATORY_CARE_PROVIDER_SITE_OTHER): Payer: Medicare HMO | Admitting: Internal Medicine

## 2020-10-13 ENCOUNTER — Other Ambulatory Visit: Payer: Self-pay

## 2020-10-13 VITALS — BP 138/70 | Ht 61.5 in | Wt 168.0 lb

## 2020-10-13 DIAGNOSIS — E785 Hyperlipidemia, unspecified: Secondary | ICD-10-CM | POA: Diagnosis not present

## 2020-10-13 DIAGNOSIS — I10 Essential (primary) hypertension: Secondary | ICD-10-CM | POA: Diagnosis not present

## 2020-10-13 DIAGNOSIS — M25562 Pain in left knee: Secondary | ICD-10-CM

## 2020-10-13 DIAGNOSIS — M25561 Pain in right knee: Secondary | ICD-10-CM | POA: Diagnosis not present

## 2020-10-13 DIAGNOSIS — G8929 Other chronic pain: Secondary | ICD-10-CM

## 2020-10-13 DIAGNOSIS — M6281 Muscle weakness (generalized): Secondary | ICD-10-CM | POA: Diagnosis not present

## 2020-10-13 DIAGNOSIS — N1832 Chronic kidney disease, stage 3b: Secondary | ICD-10-CM

## 2020-10-13 NOTE — Assessment & Plan Note (Signed)
Continue tylenol and tramadol for pain control.  Steroid injections offered and accepted.  Handicapped application for parking space initiated and shower chair DME also written.

## 2020-10-13 NOTE — Assessment & Plan Note (Signed)
Managed with simvastatin.  she has no side effects and liver enzymes have been normal  . No changes today  Lab Results  Component Value Date   CHOL 167 02/07/2020   HDL 67.00 02/07/2020   LDLCALC 78 02/07/2020   LDLDIRECT 81.0 12/09/2016   TRIG 113.0 02/07/2020   CHOLHDL 2 02/07/2020   Lab Results  Component Value Date   ALT 15 08/11/2020   AST 18 08/11/2020   ALKPHOS 71 08/11/2020   BILITOT 0.5 08/11/2020   Lab Results  Component Value Date   LDLCALC 78 02/07/2020

## 2020-10-13 NOTE — Assessment & Plan Note (Signed)
Renal function is stable and she has no evidence of anemia.  Continue to avoid NSAIDs and other nephrotoxins  Lab Results  Component Value Date   CREATININE 1.52 (H) 08/11/2020   Lab Results  Component Value Date   NA 141 08/11/2020   K 4.7 08/11/2020   CL 102 08/11/2020   CO2 31 08/11/2020

## 2020-10-13 NOTE — Progress Notes (Signed)
Virtual Visit via Telephone Note  This visit type was conducted due to national recommendations for restrictions regarding the COVID-19 pandemic (e.g. social distancing).  This format is felt to be most appropriate for this patient at this time.  All issues noted in this document were discussed and addressed.  No physical exam was performed (except for noted visual exam findings with Video Visits).   I connected with@ on 10/13/20 at  8:00 AM EST by  telephone and verified that I am speaking with the correct person using two identifiers. Location patient: home Location provider: work or home office Persons participating in the virtual l visit: patient, provider  I discussed the limitations, risks, security and privacy concerns of performing an evaluation and management service by telephone and the availability of in person appointments. I also discussed with the patient that there may be a patient responsible charge related to this service. The patient expressed understanding and agreed to proceed.  Reason for visit: bilateral knee pain , hypertension   HPI:  HTN: checking BP every other day Patient is taking her medications as prescribed and notes no adverse effects.  Home BP readings have been done about every other day and are  generally > 130/80 .  She is avoiding added salt in her diet .  She is not walking regularly due to severe DJD of both knees  .  Knee pain :  Last steroid injection 2019.  Pain limiting her walking.  EMPHATIC about not seeing orthopedics.  Willing to return here for steroid injections.  Using tylenol and tramadol  Requesting handicapped application and a shower chair.   No falls    ROS: See pertinent positives and negatives per HPI.  Past Medical History:  Diagnosis Date  . Arthritis    knees  . Depression   . Family history of polyps in the colon   . History of chicken pox   . History of colonic polyps   . Hyperlipidemia   . Hypertension   . Ulcer   . UTI  (urinary tract infection)     Past Surgical History:  Procedure Laterality Date  . BREAST SURGERY Right 2001   bernign lumpectomy   . CATARACT EXTRACTION W/PHACO Left 12/10/2019   Procedure: CATARACT EXTRACTION PHACO AND INTRAOCULAR LENS PLACEMENT (IOC) LEFT;  Surgeon: Nevada Crane, MD;  Location: [facility];  Service: Ophthalmology;  Laterality: Left;  CDE 3.55 U/S 0:45.9  . CATARACT EXTRACTION W/PHACO Right 12/31/2019   Procedure: CATARACT EXTRACTION PHACO AND INTRAOCULAR LENS PLACEMENT (IOC) RIGHT;  Surgeon: Nevada Crane, MD;  Location: [facility];  Service: Ophthalmology;  Laterality: Right;  4.11 0:40.1    Family History  Problem Relation Age of Onset  . Stroke Mother 30       cerebral hemorrhage  . Hypertension Mother   . Diabetes Mother   . Heart disease Father   . Heart attack Father 39  . Cancer Sister        pancreatic  . Cancer Sister 59       retroperitoneal Ca removed, doing fine   . Hyperlipidemia Son   . Bone cancer Son   . Diabetes Son     SOCIAL HX:  reports that she has never smoked. She has never used smokeless tobacco. She reports current alcohol use of about 5.0 standard drinks of alcohol per week. She reports that she does not use drugs.   Current Outpatient Medications:  .  amLODipine (NORVASC) 10 MG tablet,  TAKE 1 TABLET (10 MG TOTAL) BY MOUTH DAILY. IN THE MORNING FOR HYPERTENSION, Disp: 90 tablet, Rfl: 1 .  Ascorbic Acid (VITAMIN C PO), Take 1 tablet by mouth daily., Disp: , Rfl:  .  aspirin 81 MG tablet, Take 81 mg by mouth daily., Disp: , Rfl:  .  BIOTIN PO, Take by mouth daily., Disp: , Rfl:  .  busPIRone (BUSPAR) 10 MG tablet, TAKE 1 TABLET BY MOUTH THREE TIMES A DAY, Disp: 270 tablet, Rfl: 1 .  Cyanocobalamin (VITAMIN B-12 PO), Take by mouth daily., Disp: , Rfl:  .  hydrochlorothiazide (HYDRODIURIL) 25 MG tablet, TAKE 1 TABLET (25 MG TOTAL) BY MOUTH DAILY. IN THE MORNING FOR HYPERTENSION, Disp: 90 tablet, Rfl: 3 .   losartan (COZAAR) 100 MG tablet, TAKE 1 TABLET (100 MG TOTAL) BY MOUTH DAILY. IN THE EVENING FOR HYPERTENSION, Disp: 90 tablet, Rfl: 1 .  metoprolol succinate (TOPROL-XL) 50 MG 24 hr tablet, TAKE 1 TABLET (50 MG TOTAL) BY MOUTH AT BEDTIME. FOR HYPERTENSION, Disp: 90 tablet, Rfl: 1 .  Multiple Vitamins-Minerals (CENTRUM SILVER PO), Take by mouth daily., Disp: , Rfl:  .  Omega 3 1000 MG CAPS, 1 capsule twice daily with meals, Disp: 360 each, Rfl: 3 .  simvastatin (ZOCOR) 20 MG tablet, TAKE 1 TABLET BY MOUTH EVERY DAY IN THE EVENING, Disp: 90 tablet, Rfl: 1 .  traMADol (ULTRAM) 50 MG tablet, TAKE 1 TABLET (50 MG TOTAL) BY MOUTH EVERY 6 (SIX) HOURS AS NEEDED. MAXIMUM 4 TABLETS DAILY, Disp: 120 tablet, Rfl: 0 .  VITAMIN D, CHOLECALCIFEROL, PO, Take 5,000 mg by mouth daily., Disp: , Rfl:  .  VITAMIN E PO, Take 1 capsule by mouth daily., Disp: , Rfl:   Current Facility-Administered Medications:  .  cloNIDine (CATAPRES) tablet 0.1 mg, 0.1 mg, Oral, Once, Darrick Huntsman, Mar Daring, MD  EXAM:  VITALS per patient if applicable:  GENERAL: alert, oriented, appears well and in no acute distress  HEENT: atraumatic, conjunttiva clear, no obvious abnormalities on inspection of external nose and ears  NECK: normal movements of the head and neck  LUNGS: on inspection no signs of respiratory distress, breathing rate appears normal, no obvious gross SOB, gasping or wheezing  CV: no obvious cyanosis  MS: moves all visible extremities without noticeable abnormality  PSYCH/NEURO: pleasant and cooperative, no obvious depression or anxiety, speech and thought processing grossly intact  ASSESSMENT AND PLAN:  Discussed the following assessment and plan:  Proximal muscle weakness - Plan: For home use only DME Other see comment  Chronic pain of both knees - Plan: For home use only DME Other see comment  Chronic kidney disease, stage 3b (HCC)  Hyperlipidemia, unspecified hyperlipidemia type  Elevated blood pressure  reading in office with white coat syndrome, with diagnosis of hypertension  Knee pain, bilateral Continue tylenol and tramadol for pain control.  Steroid injections offered and accepted.  Handicapped application for parking space initiated and shower chair DME also written.   Chronic kidney disease, stage 3b (HCC) Renal function is stable and she has no evidence of anemia.  Continue to avoid NSAIDs and other nephrotoxins  Lab Results  Component Value Date   CREATININE 1.52 (H) 08/11/2020   Lab Results  Component Value Date   NA 141 08/11/2020   K 4.7 08/11/2020   CL 102 08/11/2020   CO2 31 08/11/2020     Hyperlipidemia Managed with simvastatin.  she has no side effects and liver enzymes have been normal  . No changes today  Lab Results  Component Value Date   CHOL 167 02/07/2020   HDL 67.00 02/07/2020   LDLCALC 78 02/07/2020   LDLDIRECT 81.0 12/09/2016   TRIG 113.0 02/07/2020   CHOLHDL 2 02/07/2020   Lab Results  Component Value Date   ALT 15 08/11/2020   AST 18 08/11/2020   ALKPHOS 71 08/11/2020   BILITOT 0.5 08/11/2020   Lab Results  Component Value Date   LDLCALC 78 02/07/2020          Elevated blood pressure reading in office with white coat syndrome, with diagnosis of hypertension Home readings remain elevated by report today and improved  control is complicated by her dementia .  Continue hctz losartan metoprolol and clonidine.  Asked to submti readings in one week     I discussed the assessment and treatment plan with the patient. The patient was provided an opportunity to ask questions and all were answered. The patient agreed with the plan and demonstrated an understanding of the instructions.   The patient was advised to call back or seek an in-person evaluation if the symptoms worsen or if the condition fails to improve as anticipated.  I provided 25 minutes of non-face-to-face time during this telephone encounter.   Sherlene Shams, MD

## 2020-10-13 NOTE — Assessment & Plan Note (Signed)
Home readings remain elevated by report today and improved  control is complicated by her dementia .  Continue hctz losartan metoprolol and clonidine.  Asked to submti readings in one week

## 2020-11-08 ENCOUNTER — Other Ambulatory Visit: Payer: Self-pay | Admitting: Internal Medicine

## 2020-11-10 ENCOUNTER — Ambulatory Visit (INDEPENDENT_AMBULATORY_CARE_PROVIDER_SITE_OTHER): Payer: Medicare HMO | Admitting: Internal Medicine

## 2020-11-10 ENCOUNTER — Encounter: Payer: Self-pay | Admitting: Internal Medicine

## 2020-11-10 ENCOUNTER — Other Ambulatory Visit: Payer: Self-pay

## 2020-11-10 VITALS — BP 178/90 | HR 62 | Temp 98.3°F | Ht 61.5 in | Wt 185.2 lb

## 2020-11-10 DIAGNOSIS — G3184 Mild cognitive impairment, so stated: Secondary | ICD-10-CM

## 2020-11-10 DIAGNOSIS — M25562 Pain in left knee: Secondary | ICD-10-CM | POA: Diagnosis not present

## 2020-11-10 DIAGNOSIS — M25561 Pain in right knee: Secondary | ICD-10-CM | POA: Diagnosis not present

## 2020-11-10 DIAGNOSIS — F411 Generalized anxiety disorder: Secondary | ICD-10-CM

## 2020-11-10 DIAGNOSIS — G8929 Other chronic pain: Secondary | ICD-10-CM

## 2020-11-10 DIAGNOSIS — M6281 Muscle weakness (generalized): Secondary | ICD-10-CM

## 2020-11-10 DIAGNOSIS — M170 Bilateral primary osteoarthritis of knee: Secondary | ICD-10-CM

## 2020-11-10 MED ORDER — TRIAMCINOLONE ACETONIDE 40 MG/ML IJ SUSP
40.0000 mg | Freq: Once | INTRAMUSCULAR | Status: AC
  Administered 2020-11-10: 40 mg via INTRAMUSCULAR

## 2020-11-10 MED ORDER — SERTRALINE HCL 25 MG PO TABS: 25.0000 mg | ORAL_TABLET | Freq: Every day | ORAL | 1 refills | Status: DC

## 2020-11-10 NOTE — Telephone Encounter (Signed)
RX Refill:tramadol Last Seen:10-13-20 Last ordered:09-01-20

## 2020-11-10 NOTE — Patient Instructions (Addendum)
I RECOMMEND STARTING YOU ON ZOLOFT FOR THE ANXIETY  TAKE THE MEDICATION ONCE DAILY WITH BREAKFAST OR LUNCH   RETURN IN 2-3 WEEKS FOR "MOBILITY EVALUATION"

## 2020-11-10 NOTE — Progress Notes (Signed)
Subjective:  Patient ID: Tiffany Benjamin, female    DOB: 03-Sep-1933  Age: 87 y.o. MRN: [id number]  CC: The primary encounter diagnosis was Chronic pain of both knees. Diagnoses of Proximal muscle weakness, Mild cognitive impairment with memory loss, Tricompartment osteoarthritis of both knees, and Generalized anxiety disorder were also pertinent to this visit.  HPI Tiffany Benjamin presents for follow up on multiple issues, including chronic knee pain, dementia, and hypertension.   She is accompanied by her son  This visit occurred during the SARS-CoV-2 public health emergency.  Safety protocols were in place, including screening questions prior to the visit, additional usage of staff PPE, and extensive cleaning of exam room while observing appropriate contact time as indicated for disinfecting solutions.    1) chronic knee pain.  She is taking tramadol three times daily for management of pain due to DJD not relieved with tylenol.  She is requesting an I/A steroid injection today.  Her son is also requesting help in getting her a motorized scooter for use when she leaves the home to grocery shop . He is also requesting a DME order for a shower bench , and an application for a handicapped parking sticker.    2) He has noticed that she "huffs and puffs"  In the morning for the first few minutes of activity.  She also acts this way when she gets excited .  He has checked her room air saturations using a fingertip pulse ox and notes that it is consistently  98%.  She denies orthopnea and chest pain   3) He and his wife have noticed that she has become more forgetful, and exhibits signs  of "paranoia"  When she can't find things she has misplaced.  She has voiced concerns about things being stolen , including her purse,  When  she has actually misplaced them and has started to have more frequent  anger outbursts at FAMILY.  Up to now she has refused to consider daily medication to manage her anxiety,   But states that she is taking buspirone 3 times daily.  Son cannot confirm daytime doses since he is not home during the day. She states that although her daughter in law is home daily,  She remains upstairs,  While patient is downstairs alone.     5) Hypertension:  Patient's son is supervising her morning and evening medications and has been measuring her BP at home.  This morning her HOME BP READING WAS 138/70    Outpatient Medications Prior to Visit  Medication Sig Dispense Refill  . amLODipine (NORVASC) 10 MG tablet TAKE 1 TABLET (10 MG TOTAL) BY MOUTH DAILY. IN THE MORNING FOR HYPERTENSION 90 tablet 1  . Ascorbic Acid (VITAMIN C PO) Take 1 tablet by mouth daily.    Marland Kitchen aspirin 81 MG tablet Take 81 mg by mouth daily.    Marland Kitchen BIOTIN PO Take by mouth daily.    . busPIRone (BUSPAR) 10 MG tablet TAKE 1 TABLET BY MOUTH THREE TIMES A DAY 270 tablet 1  . Cyanocobalamin (VITAMIN B-12 PO) Take by mouth daily.    . hydrochlorothiazide (HYDRODIURIL) 25 MG tablet TAKE 1 TABLET (25 MG TOTAL) BY MOUTH DAILY. IN THE MORNING FOR HYPERTENSION 90 tablet 3  . losartan (COZAAR) 100 MG tablet TAKE 1 TABLET (100 MG TOTAL) BY MOUTH DAILY. IN THE EVENING FOR HYPERTENSION 90 tablet 1  . metoprolol succinate (TOPROL-XL) 50 MG 24 hr tablet TAKE 1 TABLET (50 MG TOTAL) BY  MOUTH AT BEDTIME. FOR HYPERTENSION 90 tablet 1  . Multiple Vitamins-Minerals (CENTRUM SILVER PO) Take by mouth daily.    . Omega 3 1000 MG CAPS 1 capsule twice daily with meals 360 each 3  . simvastatin (ZOCOR) 20 MG tablet TAKE 1 TABLET BY MOUTH EVERY DAY IN THE EVENING 90 tablet 1  . VITAMIN D, CHOLECALCIFEROL, PO Take 5,000 mg by mouth daily.    Marland Kitchen VITAMIN E PO Take 1 capsule by mouth daily.    . traMADol (ULTRAM) 50 MG tablet TAKE 1 TABLET (50 MG TOTAL) BY MOUTH EVERY 6 (SIX) HOURS AS NEEDED. MAXIMUM 4 TABLETS DAILY 120 tablet 0   Facility-Administered Medications Prior to Visit  Medication Dose Route Frequency Provider Last Rate Last Admin  .  cloNIDine (CATAPRES) tablet 0.1 mg  0.1 mg Oral Once Sherlene Shams, MD        Review of Systems;  Patient denies headache, fevers, malaise, unintentional weight loss, skin rash, eye pain, sinus congestion and sinus pain, sore throat, dysphagia,  hemoptysis , cough, dyspnea, wheezing, chest pain, palpitations, orthopnea, edema, abdominal pain, nausea, melena, diarrhea, constipation, flank pain, dysuria, hematuria, urinary  Frequency, nocturia, numbness, tingling, seizures,  Focal weakness, Loss of consciousness,  Tremor, insomnia, depression, anxiety, and suicidal ideation.      Objective:  BP (!) 178/90 (BP Location: Left Arm, Patient Position: Sitting)   Pulse 62   Temp 98.3 F (36.8 C)   Ht 5' 1.5" (1.562 m)   Wt 185 lb 3.2 oz (84 kg)   SpO2 97%   BMI 34.43 kg/m   BP Readings from Last 3 Encounters:  11/10/20 (!) 178/90  10/13/20 138/70  08/11/20 140/78    Wt Readings from Last 3 Encounters:  11/10/20 185 lb 3.2 oz (84 kg)  10/13/20 168 lb (76.2 kg)  08/11/20 176 lb 12.8 oz (80.2 kg)    General appearance: alert, cooperative and appears stated age Ears: normal TM's and external ear canals both ears Throat: lips, mucosa, and tongue normal; teeth and gums normal Neck: no adenopathy, no carotid bruit, supple, symmetrical, trachea midline and thyroid not enlarged, symmetric, no tenderness/mass/nodules Back: symmetric, no curvature. ROM normal. No CVA tenderness. Lungs: clear to auscultation bilaterally Heart: regular rate and rhythm, S1, S2 normal, no murmur, click, rub or gallop Abdomen: soft, non-tender; bowel sounds normal; no masses,  no organomegaly Pulses: 2+ and symmetric Skin: Skin color, texture, turgor normal. No rashes or lesions Lymph nodes: Cervical, supraclavicular, and axillary nodes normal.  Lab Results  Component Value Date   HGBA1C 5.7 02/07/2020   HGBA1C 5.9 06/26/2019   HGBA1C 5.7 06/02/2018    Lab Results  Component Value Date   CREATININE  1.52 (H) 08/11/2020   CREATININE 1.42 (H) 06/27/2020   CREATININE 1.52 (H) 02/07/2020    Lab Results  Component Value Date   WBC 6.4 08/11/2020   HGB 12.8 08/11/2020   HCT 38.2 08/11/2020   PLT 243.0 08/11/2020   GLUCOSE 94 08/11/2020   CHOL 167 02/07/2020   TRIG 113.0 02/07/2020   HDL 67.00 02/07/2020   LDLDIRECT 81.0 12/09/2016   LDLCALC 78 02/07/2020   ALT 15 08/11/2020   AST 18 08/11/2020   NA 141 08/11/2020   K 4.7 08/11/2020   CL 102 08/11/2020   CREATININE 1.52 (H) 08/11/2020   BUN 24 (H) 08/11/2020   CO2 31 08/11/2020   TSH 0.87 08/11/2020   HGBA1C 5.7 02/07/2020   MICROALBUR 10.3 (H) 06/02/2018    No  results found.  Assessment & Plan:   Problem List Items Addressed This Visit      Unprioritized   Generalized anxiety disorder    Aggravated by her cognitive impairment.  Family notes signs of paranoia which occur when she misplaces her purse,  Wallet, etc.  She is agreeable, finally to trial of zoloft.  245 mg dose prescrived,  Follow up in  in 2-3 weeks       Relevant Medications   sertraline (ZOLOFT) 25 MG tablet   Knee pain, bilateral - Primary    Continue tylenol and tramadol for pain control.   Handicapped application for parking space initiated and shower chair DME also written. Patient has requested a steroid injection for relief of pain not controlled with oral medications.    Informed consent for joint injection obtained after discussion of the risks of infection and bleeding were reviewed  .  Medial side of left knee  was cleaned with betadine an alcohol swabs .  Medial side of left knee  was injected with 40 mg Kenalog and 4 ml of 1% Xylocaine with a 22 gauge needle under sterile conditions.   Procedure was tolerated well and knee was feeling less painful by the time he left the office.  he was advised to rest the knee for 48 hours , apply ice packs every 6 hours for 15 minutes, advised to call if she develops signs of infection (REDNESS, PAIN,  WARMTH)       Mild cognitive impairment with memory loss    She is progressing to dementia.  Her family is providing care and supervision at home.  Will make referral to [facility] for community resources.       Relevant Orders   AMB Referral to [facility] Coordinaton   Proximal muscle weakness    Agree with need for shower chair.  Request for motorized scooter discussed;  She will return for evaluation .  Advised son that her car will require a motorized lift for her to use to transport the scooter.      Relevant Orders   For home use only DME Other see comment   Tricompartment Severe Knee OA, Bilateral   Relevant Orders   For home use only DME Other see comment      I am having Ita M. Threat start on sertraline. I am also having her maintain her aspirin, Omega 3, (VITAMIN D, CHOLECALCIFEROL, PO), Ascorbic Acid (VITAMIN C PO), VITAMIN E PO, Cyanocobalamin (VITAMIN B-12 PO), BIOTIN PO, Multiple Vitamins-Minerals (CENTRUM SILVER PO), metoprolol succinate, losartan, busPIRone, hydrochlorothiazide, amLODipine, and simvastatin. We administered triamcinolone acetonide. We will continue to administer cloNIDine.  Meds ordered this encounter  Medications  . triamcinolone acetonide (KENALOG-40) injection 40 mg  . sertraline (ZOLOFT) 25 MG tablet    Sig: Take 1 tablet (25 mg total) by mouth daily.    Dispense:  90 tablet    Refill:  1    There are no discontinued medications.  Follow-up: Return in about 3 weeks (around 12/01/2020).   Sherlene Shams, MD

## 2020-11-11 MED ORDER — LIDOCAINE HCL 1 % IJ SOLN
4.0000 mL | Freq: Once | INTRAMUSCULAR | Status: AC
  Administered 2020-11-11: 4 mL

## 2020-11-11 NOTE — Assessment & Plan Note (Signed)
She is progressing to dementia.  Her family is providing care and supervision at home.  Will make referral to [facility] for community resources.

## 2020-11-11 NOTE — Assessment & Plan Note (Signed)
Aggravated by her cognitive impairment.  Family notes signs of paranoia which occur when she misplaces her purse,  Wallet, etc.  She is agreeable, finally to trial of zoloft.  245 mg dose prescrived,  Follow up in  in 2-3 weeks

## 2020-11-11 NOTE — Assessment & Plan Note (Signed)
Agree with need for shower chair.  Request for motorized scooter discussed;  She will return for evaluation .  Advised son that her car will require a motorized lift for her to use to transport the scooter.

## 2020-11-11 NOTE — Assessment & Plan Note (Signed)
Continue tylenol and tramadol for pain control.   Handicapped application for parking space initiated and shower chair DME also written. Patient has requested a steroid injection for relief of pain not controlled with oral medications.    Informed consent for joint injection obtained after discussion of the risks of infection and bleeding were reviewed  .  Medial side of left knee  was cleaned with betadine an alcohol swabs .  Medial side of left knee  was injected with 40 mg Kenalog and 4 ml of 1% Xylocaine with a 22 gauge needle under sterile conditions.   Procedure was tolerated well and knee was feeling less painful by the time he left the office.  he was advised to rest the knee for 48 hours , apply ice packs every 6 hours for 15 minutes, advised to call if she develops signs of infection (REDNESS, PAIN,  WARMTH)

## 2020-11-12 ENCOUNTER — Other Ambulatory Visit: Payer: Self-pay

## 2020-11-12 NOTE — Patient Outreach (Signed)
[facility]) Care Management  11/12/2020  SHARIAN DELIA 31-Oct-1932 [id number]   Telephone Screen  Referral Date: 11/11/2020 Referral Source: MD Office Referral Reason: "dementia care resources" Insurance: [facility]   Outreach attempt # 1. No answer. RN CM left HIPAA compliant voicemail message along with contact info.     Plan: RN CM will make outreach attempt to patient within 3-4 business days. RN CM will send unsuccessful outreach letter to patient.   [name], RN,BSN,CCM [facility] Care Management Telephonic Care Management Coordinator Direct Phone: [phone] Toll Free: [phone] Fax: [phone]

## 2020-11-13 ENCOUNTER — Other Ambulatory Visit: Payer: Self-pay

## 2020-11-13 NOTE — Patient Outreach (Signed)
[facility]) Care Management  11/13/2020  Tiffany Benjamin December 04, 1932 [id number]    Telephone Screen  Referral Date: 11/11/2020 Referral Source: MD Office Referral Reason: "dementia care resources" Insurance: [facility]   Outreach attempt #2 to patient/caregiver. Spoke with patient who denies any cute issues or concerns at present. Discussed and reviewed referral source and reasons. Patient states she does not need any assistance in the home. She reports sh  Is able to care for herself and is independent with ADLs. She lives with her son who works during the day and assists hr with her IADLs. Patient not really able to complete screening assessment due to dementia. Advised that RN CM would attempt to reach one of her children later. RN CM has already left voicemail message on daughter's phone yesterday and no return call.     Plan: RN CM will make outreach attempt to patient/caregiver within 3-4 business days. Tiffany Fairy, RN,BSN,CCM [facility] Care Management Telephonic Care Management Coordinator Direct Phone: [phone] Toll Free: [phone] Fax: [phone]

## 2020-11-13 NOTE — Patient Outreach (Signed)
[facility]) Care Management  11/13/2020  TYWANDA RICE [birth year] [id number]   Telephone Screen  Referral Date:11/11/2020 Referral Source:MD Office Referral Reason:"dementia care resources" Insurance:[employer]   RN CM placed call to patient's home to speak with son. No answer at present.     Plan: RN CM will make outreach attempt within 3-4 business days.  [name], RN,BSN,CCM [facility] Care Management Telephonic Care Management Coordinator Direct Phone: [phone] Toll Free: [phone] Fax: [phone]'

## 2020-11-18 ENCOUNTER — Other Ambulatory Visit: Payer: Self-pay

## 2020-11-18 NOTE — Patient Outreach (Signed)
[facility]) Care Management  11/18/2020  Tiffany Benjamin Dec 08, 1932 [id number]   Telephone Screen  Referral Date:11/11/2020 Referral Source:MD Office Referral Reason:"dementia care resources" Insurance:[employer]   Outreach attempt # 3 to patient/caregiver. Spoke with daughter-Dorothy. Discussed and reviewed referral source. Daughter states that patient is still functional-able to care for self and complete ADLs. She is still able to drive although family does not really want her driving. Patient lives with her son and daughter in law. Caregiver states that patient can become defiant at times and has mood swings- noted to be worse in the evenings. RN CM assessed for what services/resources family looking for. Caregiver is not looking for in home support. She states that they want to know if patient has dementia as she has never been officially diagnosed, how to get her tested and what treatment options are available. Advised caregiver that she should speak with PCP regarding possible neurology referral. She will follow up with PCP office. She denies any [facility] care coordination or needs at this time.They just want to know if their mother has dementia and what they can do to treat it.   Plan: RN CM will close case at this time. RN CM will send message/route note to MD office regarding family's request for neuro referral.  Alessandra Grout [facility] Care Management Telephonic Care Management Coordinator Direct Phone: [phone] Toll Free: [phone] Fax: [phone]

## 2020-11-24 ENCOUNTER — Ambulatory Visit: Payer: Medicare HMO | Admitting: Internal Medicine

## 2020-11-24 DIAGNOSIS — Z0289 Encounter for other administrative examinations: Secondary | ICD-10-CM

## 2020-11-26 ENCOUNTER — Telehealth: Payer: Self-pay | Admitting: Internal Medicine

## 2020-11-26 NOTE — Telephone Encounter (Signed)
[facility] called and all losartan is on back order. Pharmacy would like to know if Dr. Darrick Huntsman can prescribe another medication at this time.

## 2020-11-27 MED ORDER — TELMISARTAN 40 MG PO TABS
40.0000 mg | ORAL_TABLET | Freq: Every day | ORAL | 1 refills | Status: DC
Start: 2020-11-27 — End: 2021-05-18

## 2020-11-27 NOTE — Telephone Encounter (Signed)
ttelmisartan 40 mg daily sent as susbstitute

## 2020-11-27 NOTE — Telephone Encounter (Signed)
I tried both mobile numbers, but was unable to reach. LM on home phone to call back to make pt aware of change.

## 2020-11-27 NOTE — Telephone Encounter (Signed)
Please advise 

## 2020-12-01 ENCOUNTER — Telehealth: Payer: Self-pay

## 2020-12-01 NOTE — Telephone Encounter (Signed)
Patient was instructed to go to an ED, per access nurse.            [facility] Night - Cl TELEPHONE ADVICE RECORD AccessNurse Patient Name: Tiffany Benjamin Gender: Female DOB: 1933-06-28 Age: 87 Y 2 D Return Phone Number: [phone] (Primary), [phone] (Secondary) Address: City/State/Zip: [place] [address] Client [facility] Night - Cl Client Site [facility] - Night Physician Duncan Dull - MD Contact Type Call Who Is Calling Patient / Member / Family / Caregiver Call Type Triage / Clinical Caller Name Francee Nodal Return Phone Number [phone] (Primary) Chief Complaint Blood Pressure Low Reason for Call Symptomatic / Request for Health Information Initial Comment Caller states her mother in law was feeling weak and tired. Blood pressure 167/72 and heart rate 47 Translation No Nurse Assessment Nurse: Karena Addison, RN, Verlon Au Date/Time (Eastern Time): 11/30/2020 9:32:24 AM Confirm and document reason for call. If symptomatic, describe symptoms. ---Caller states her mother in law is feeling weak and tired this morning. Her blood pressure 167/72 and heart rate 47. She is alert and not dizzy. Does the patient have any new or worsening symptoms? ---Yes Will a triage be completed? ---Yes Related visit to physician within the last 2 weeks? ---No Does the PT have any chronic conditions? (i.e. diabetes, asthma, this includes High risk factors for pregnancy, etc.) ---Yes List chronic conditions. ---HTN Is this a behavioral health or substance abuse call? ---No Guidelines Guideline Title Affirmed Question Affirmed Notes Nurse Date/Time (Eastern Time) Weakness (Generalized) and Fatigue Heart beating < 50 beats per minute OR > 140 beats per minute Huffine, RN, Verlon Au 11/30/2020 9:35:35 AM Disp. Time Lamount Cohen Time) Disposition Final User 11/30/2020 9:37:04 AM Go to ED Now Yes Karena Addison,  RN, Rocky Link Disagree/Comply Disagree Caller Understands Yes PLEASE NOTE: All timestamps contained within this report are represented as [place] Standard Time. CONFIDENTIALTY NOTICE: This fax transmission is intended only for the addressee. It contains information that is legally privileged, confidential or otherwise protected from use or disclosure. If you are not the intended recipient, you are strictly prohibited from reviewing, disclosing, copying using or disseminating any of this information or taking any action in reliance on or regarding this information. If you have received this fax in error, please notify us immediately by telephone so that we can arrange for its return to [place]. Phone: [phone], Toll-Free: [phone], Fax: [phone] Page: 2 of 2 Call Id: [id number] PreDisposition Call Doctor Care Advice Given Per Guideline GO TO ED NOW: * You need to be seen in the Emergency Department. NOTE TO TRIAGER - DRIVING: * Another adult should drive. BRING MEDICINES: * Bring a list of your current medicines when you go to the Emergency Department (ER). Referrals GO TO FACILITY REFUSED

## 2020-12-18 ENCOUNTER — Telehealth: Payer: Self-pay | Admitting: Internal Medicine

## 2020-12-18 NOTE — Telephone Encounter (Signed)
I have an 11:00 tomorrow that is suddenly open. Please offer it to patiebt.  She should arrive early for a chest x ray and EKG.

## 2020-12-18 NOTE — Telephone Encounter (Signed)
Spoken to patient daughter Nicole Cella, patient has been SOB while walking, and chest heaviness at times, currently not having chest sx. No fever, Chills, nausea, vomitting, light headedness, dizziness, heart palpitations, blurry vision. Daughter stated patient refuses to go to UC/ED. Patient stated she has an appointment on the [id number] and wants to see her doctor. Daughter number is [phone] .

## 2020-12-18 NOTE — Telephone Encounter (Signed)
Pt daughter called and said that her mother is getting out of breath going from the living room to the kitchen Pt told daughter that she didn't want her to call Dr. Melina Schools office

## 2020-12-18 NOTE — Telephone Encounter (Signed)
Patient has appointment tomorrow at 1100. They will be here at 1030 for other tests.

## 2020-12-19 ENCOUNTER — Other Ambulatory Visit: Payer: Self-pay

## 2020-12-19 ENCOUNTER — Encounter: Payer: Self-pay | Admitting: Internal Medicine

## 2020-12-19 ENCOUNTER — Ambulatory Visit (INDEPENDENT_AMBULATORY_CARE_PROVIDER_SITE_OTHER): Payer: Medicare HMO | Admitting: Internal Medicine

## 2020-12-19 ENCOUNTER — Ambulatory Visit (INDEPENDENT_AMBULATORY_CARE_PROVIDER_SITE_OTHER): Payer: Medicare HMO

## 2020-12-19 VITALS — BP 152/72 | HR 78 | Temp 97.9°F | Ht 61.5 in | Wt 177.2 lb

## 2020-12-19 DIAGNOSIS — E785 Hyperlipidemia, unspecified: Secondary | ICD-10-CM

## 2020-12-19 DIAGNOSIS — R079 Chest pain, unspecified: Secondary | ICD-10-CM

## 2020-12-19 DIAGNOSIS — N1832 Chronic kidney disease, stage 3b: Secondary | ICD-10-CM

## 2020-12-19 LAB — CBC WITH DIFFERENTIAL/PLATELET
Basophils Absolute: 0 10*3/uL (ref 0.0–0.1)
Basophils Relative: 0.7 % (ref 0.0–3.0)
Eosinophils Absolute: 0.1 10*3/uL (ref 0.0–0.7)
Eosinophils Relative: 1.9 % (ref 0.0–5.0)
HCT: 36.5 % (ref 36.0–46.0)
Hemoglobin: 12.4 g/dL (ref 12.0–15.0)
Lymphocytes Relative: 31.2 % (ref 12.0–46.0)
Lymphs Abs: 2 10*3/uL (ref 0.7–4.0)
MCHC: 33.9 g/dL (ref 30.0–36.0)
MCV: 97.3 fl (ref 78.0–100.0)
Monocytes Absolute: 0.7 10*3/uL (ref 0.1–1.0)
Monocytes Relative: 10.8 % (ref 3.0–12.0)
Neutro Abs: 3.6 10*3/uL (ref 1.4–7.7)
Neutrophils Relative %: 55.4 % (ref 43.0–77.0)
Platelets: 273 10*3/uL (ref 150.0–400.0)
RBC: 3.75 Mil/uL — ABNORMAL LOW (ref 3.87–5.11)
RDW: 15.7 % — ABNORMAL HIGH (ref 11.5–15.5)
WBC: 6.5 10*3/uL (ref 4.0–10.5)

## 2020-12-19 LAB — VITAMIN D 25 HYDROXY (VIT D DEFICIENCY, FRACTURES): VITD: 69.09 ng/mL (ref 30.00–100.00)

## 2020-12-19 LAB — LIPID PANEL
Cholesterol: 190 mg/dL (ref 0–200)
HDL: 95 mg/dL (ref >39.00)
LDL Cholesterol: 76 mg/dL (ref 0–99)
NonHDL: 95.43
Total CHOL/HDL Ratio: 2
Triglycerides: 95 mg/dL (ref 0.0–149.0)
VLDL: 19 mg/dL (ref 0.0–40.0)

## 2020-12-19 LAB — COMPREHENSIVE METABOLIC PANEL
ALT: 17 U/L (ref 0–35)
AST: 19 U/L (ref 0–37)
Albumin: 4.2 g/dL (ref 3.5–5.2)
Alkaline Phosphatase: 74 U/L (ref 39–117)
BUN: 24 mg/dL — ABNORMAL HIGH (ref 6–23)
CO2: 29 mEq/L (ref 19–32)
Calcium: 9.5 mg/dL (ref 8.4–10.5)
Chloride: 103 mEq/L (ref 96–112)
Creatinine, Ser: 1.22 mg/dL — ABNORMAL HIGH (ref 0.40–1.20)
GFR: 40.31 mL/min — ABNORMAL LOW (ref >60.00)
Glucose, Bld: 82 mg/dL (ref 70–99)
Potassium: 4.1 mEq/L (ref 3.5–5.1)
Sodium: 142 mEq/L (ref 135–145)
Total Bilirubin: 0.6 mg/dL (ref 0.2–1.2)
Total Protein: 7.4 g/dL (ref 6.0–8.3)

## 2020-12-19 LAB — BRAIN NATRIURETIC PEPTIDE: Pro B Natriuretic peptide (BNP): 143 pg/mL — ABNORMAL HIGH (ref 0.0–100.0)

## 2020-12-19 NOTE — Patient Instructions (Addendum)
Increase sertraline to 50 mg with breakfast ( antidepressant) .  Use the 25 mg tablets for now since you have PLENTY   Check Tiffany Benjamin's blood pressure once daily (when calm )  for one week.  If all of her top number readings are above  150,  [name] of  telmisartan  to 80 mg daily in the evening .  I would like her to see a cardiologist in [facility] for evaluation to see if her heart is having trouble.  If you don't have a preference,  I prefer:  Dr Juliann Pares or Dr Lady Gary at [facility] cardiologists    The buspirone will help agitation caused by conflict, but not likely if it is due to "sundowning" which happens to people when they start to have memory problems.

## 2020-12-19 NOTE — Progress Notes (Signed)
Subjective:  Patient ID: Tiffany Benjamin, female    DOB: August 18, 1933  Age: 87 y.o. MRN: [id number]  CC: The primary encounter diagnosis was Chest pain, unspecified type. Diagnoses of Hyperlipidemia, unspecified hyperlipidemia type and Chronic kidney disease, stage 3b (HCC) were also pertinent to this visit.  HPI Tiffany Benjamin presents for evaluation of  recent episode  of exertional chest pain. She is accompanied by her daughter, Tiffany Benjamin   This visit occurred during the SARS-CoV-2 public health emergency.  Safety protocols were in place, including screening questions prior to the visit, additional usage of staff PPE, and extensive cleaning of exam room while observing appropriate contact time as indicated for disinfecting solutions.   Per daughter :  Sister in law passed away last week and they attended her  funeral  last Friday. Afterward the patient had an extremely emotional outburst over something she had misplaced and thought had been taken/stolen..  The next day after cleaning her house she developed  chest pain which she reported to her family on Sunday, but refused to be evaluated by Urgent Care or ER. She has not had any episode since then and is reluctant to speak about it today, but does admit to having dyspnea with with ADL's  Metoprolol was stopped 2 weeks ago due to slow heart rate noted by a family member accompanied by fatigue (pulse was reported as 74)  Daughter privately volunteered additional information about the family dynamics;  Patient's son and dtr  In law are moving out because of constant conflict  With patient.   Outpatient Medications Prior to Visit  Medication Sig Dispense Refill  . amLODipine (NORVASC) 10 MG tablet TAKE 1 TABLET (10 MG TOTAL) BY MOUTH DAILY. IN THE MORNING FOR HYPERTENSION 90 tablet 1  . Ascorbic Acid (VITAMIN C PO) Take 1 tablet by mouth daily.    Marland Kitchen aspirin 81 MG tablet Take 81 mg by mouth daily.    Marland Kitchen BIOTIN PO Take by mouth daily.    .  busPIRone (BUSPAR) 10 MG tablet TAKE 1 TABLET BY MOUTH THREE TIMES A DAY 270 tablet 1  . Cyanocobalamin (VITAMIN B-12 PO) Take by mouth daily.    . hydrochlorothiazide (HYDRODIURIL) 25 MG tablet TAKE 1 TABLET (25 MG TOTAL) BY MOUTH DAILY. IN THE MORNING FOR HYPERTENSION 90 tablet 3  . Multiple Vitamins-Minerals (CENTRUM SILVER PO) Take by mouth daily.    . Omega 3 1000 MG CAPS 1 capsule twice daily with meals 360 each 3  . sertraline (ZOLOFT) 25 MG tablet Take 1 tablet (25 mg total) by mouth daily. 90 tablet 1  . simvastatin (ZOCOR) 20 MG tablet TAKE 1 TABLET BY MOUTH EVERY DAY IN THE EVENING 90 tablet 1  . telmisartan (MICARDIS) 40 MG tablet Take 1 tablet (40 mg total) by mouth at bedtime. 90 tablet 1  . traMADol (ULTRAM) 50 MG tablet TAKE 1 TABLET (50 MG TOTAL) BY MOUTH EVERY 6 (SIX) HOURS AS NEEDED. MAXIMUM 4 TABLETS DAILY 90 tablet 5  . VITAMIN D, CHOLECALCIFEROL, PO Take 5,000 mg by mouth daily.    Marland Kitchen VITAMIN E PO Take 1 capsule by mouth daily.    . metoprolol succinate (TOPROL-XL) 50 MG 24 hr tablet TAKE 1 TABLET (50 MG TOTAL) BY MOUTH AT BEDTIME. FOR HYPERTENSION (Patient not taking: Reported on 12/19/2020) 90 tablet 1   Facility-Administered Medications Prior to Visit  Medication Dose Route Frequency Provider Last Rate Last Admin  . cloNIDine (CATAPRES) tablet 0.1 mg  0.1  mg Oral Once Sherlene Shams, MD        Review of Systems;  Patient denies headache, fevers, malaise, unintentional weight loss, skin rash, eye pain, sinus congestion and sinus pain, sore throat, dysphagia,  hemoptysis , cough, dyspnea at rest, wheezing, recurrent chest pain, palpitations, orthopnea, edema, abdominal pain, nausea, melena, diarrhea, constipation, flank pain, dysuria, hematuria, urinary  Frequency, nocturia, numbness, tingling, seizures,  Focal weakness, Loss of consciousness,  Tremor, insomnia, depression, anxiety, and suicidal ideation.      Objective:  BP (!) 152/72 (BP Location: Left Arm, Patient  Position: Sitting, Cuff Size: Normal)   Pulse 78   Temp 97.9 F (36.6 C)   Ht 5' 1.5" (1.562 m)   Wt 177 lb 3.2 oz (80.4 kg)   SpO2 97%   BMI 32.94 kg/m   BP Readings from Last 3 Encounters:  12/19/20 (!) 152/72  11/10/20 (!) 178/90  10/13/20 138/70    Wt Readings from Last 3 Encounters:  12/19/20 177 lb 3.2 oz (80.4 kg)  11/10/20 185 lb 3.2 oz (84 kg)  10/13/20 168 lb (76.2 kg)    General appearance: alert, cooperative and appears stated age Ears: normal TM's and external ear canals both ears Throat: lips, mucosa, and tongue normal; teeth and gums normal Neck: no adenopathy, no carotid bruit, supple, symmetrical, trachea midline and thyroid not enlarged, symmetric, no tenderness/mass/nodules Back: symmetric, no curvature. ROM normal. No CVA tenderness. Lungs: clear to auscultation bilaterally Heart: regular rate and rhythm, S1, S2 normal, no murmur, click, rub or gallop Abdomen: soft, non-tender; bowel sounds normal; no masses,  no organomegaly Pulses: 2+ and symmetric Skin: Skin color, texture, turgor normal. No rashes or lesions Lymph nodes: Cervical, supraclavicular, and axillary nodes normal.  Lab Results  Component Value Date   HGBA1C 5.7 02/07/2020   HGBA1C 5.9 06/26/2019   HGBA1C 5.7 06/02/2018    Lab Results  Component Value Date   CREATININE 1.22 (H) 12/19/2020   CREATININE 1.52 (H) 08/11/2020   CREATININE 1.42 (H) 06/27/2020    Lab Results  Component Value Date   WBC 6.5 12/19/2020   HGB 12.4 12/19/2020   HCT 36.5 12/19/2020   PLT 273.0 12/19/2020   GLUCOSE 82 12/19/2020   CHOL 190 12/19/2020   TRIG 95.0 12/19/2020   HDL 95.00 12/19/2020   LDLDIRECT 81.0 12/09/2016   LDLCALC 76 12/19/2020   ALT 17 12/19/2020   AST 19 12/19/2020   NA 142 12/19/2020   K 4.1 12/19/2020   CL 103 12/19/2020   CREATININE 1.22 (H) 12/19/2020   BUN 24 (H) 12/19/2020   CO2 29 12/19/2020   TSH 0.87 08/11/2020   HGBA1C 5.7 02/07/2020   MICROALBUR 10.3 (H)  06/02/2018    No results found.  Assessment & Plan:   Problem List Items Addressed This Visit      Unprioritized   Chest pain - Primary    Occurred with exertion in the setting of recent emotional stressors . I have ordered and reviewed a 12 lead EKG and find that there are no acute changes and patient is in sinus rhythm.  Labs done today as well as chest x ray are also nondiagnostic .  Recommend referral to cardiology for risk stratification           Relevant Orders   EKG 12-Lead   DG Chest 2 View   B Nat Peptide (Completed)   CBC with Differential/Platelet (Completed)   Ambulatory referral to Cardiology   Chronic kidney disease, stage  3b (HCC)   Relevant Orders   Comprehensive metabolic panel (Completed)   VITAMIN D 25 Hydroxy (Vit-D Deficiency, Fractures) (Completed)   Hyperlipidemia    Managed with simvastatin.  she has no side effects and liver enzymes have been normal  . No changes today  Lab Results  Component Value Date   CHOL 190 12/19/2020   HDL 95.00 12/19/2020   LDLCALC 76 12/19/2020   LDLDIRECT 81.0 12/09/2016   TRIG 95.0 12/19/2020   CHOLHDL 2 12/19/2020   Lab Results  Component Value Date   ALT 17 12/19/2020   AST 19 12/19/2020   ALKPHOS 74 12/19/2020   BILITOT 0.6 12/19/2020   Lab Results  Component Value Date   LDLCALC 76 12/19/2020              Relevant Orders   Lipid panel (Completed)      I have discontinued Laurey Morale. Glahn's metoprolol succinate. I am also having her maintain her aspirin, Omega 3, (VITAMIN D, CHOLECALCIFEROL, PO), Ascorbic Acid (VITAMIN C PO), VITAMIN E PO, Cyanocobalamin (VITAMIN B-12 PO), BIOTIN PO, Multiple Vitamins-Minerals (CENTRUM SILVER PO), busPIRone, hydrochlorothiazide, amLODipine, simvastatin, traMADol, sertraline, and telmisartan. We will continue to administer cloNIDine.  No orders of the defined types were placed in this encounter.   Medications Discontinued During This Encounter  Medication  Reason  . metoprolol succinate (TOPROL-XL) 50 MG 24 hr tablet     Follow-up: No follow-ups on file.   Sherlene Shams, MD

## 2020-12-20 DIAGNOSIS — R079 Chest pain, unspecified: Secondary | ICD-10-CM

## 2020-12-20 HISTORY — DX: Chest pain, unspecified: R07.9

## 2020-12-20 NOTE — Assessment & Plan Note (Signed)
Managed with simvastatin.  she has no side effects and liver enzymes have been normal  . No changes today  Lab Results  Component Value Date   CHOL 190 12/19/2020   HDL 95.00 12/19/2020   LDLCALC 76 12/19/2020   LDLDIRECT 81.0 12/09/2016   TRIG 95.0 12/19/2020   CHOLHDL 2 12/19/2020   Lab Results  Component Value Date   ALT 17 12/19/2020   AST 19 12/19/2020   ALKPHOS 74 12/19/2020   BILITOT 0.6 12/19/2020   Lab Results  Component Value Date   LDLCALC 76 12/19/2020

## 2020-12-20 NOTE — Assessment & Plan Note (Addendum)
Occurred with exertion in the setting of recent emotional stressors . I have ordered and reviewed a 12 lead EKG and find that there are no acute changes and patient is in sinus rhythm.  Labs done today as well as chest x ray are also nondiagnostic .  Recommend referral to cardiology for risk stratification

## 2020-12-23 ENCOUNTER — Ambulatory Visit: Payer: Medicare HMO | Admitting: Internal Medicine

## 2020-12-28 ENCOUNTER — Other Ambulatory Visit: Payer: Self-pay | Admitting: Internal Medicine

## 2021-02-04 ENCOUNTER — Other Ambulatory Visit: Payer: Self-pay | Admitting: Internal Medicine

## 2021-02-09 ENCOUNTER — Other Ambulatory Visit: Payer: Self-pay | Admitting: Internal Medicine

## 2021-03-25 ENCOUNTER — Other Ambulatory Visit: Payer: Self-pay

## 2021-03-25 ENCOUNTER — Ambulatory Visit (INDEPENDENT_AMBULATORY_CARE_PROVIDER_SITE_OTHER): Payer: Medicare HMO | Admitting: Internal Medicine

## 2021-03-25 ENCOUNTER — Encounter: Payer: Self-pay | Admitting: Internal Medicine

## 2021-03-25 VITALS — BP 142/78 | HR 62 | Temp 96.6°F | Resp 15 | Ht 61.5 in | Wt 174.2 lb

## 2021-03-25 DIAGNOSIS — G8929 Other chronic pain: Secondary | ICD-10-CM | POA: Diagnosis not present

## 2021-03-25 DIAGNOSIS — M170 Bilateral primary osteoarthritis of knee: Secondary | ICD-10-CM

## 2021-03-25 DIAGNOSIS — M25562 Pain in left knee: Secondary | ICD-10-CM

## 2021-03-25 DIAGNOSIS — M25561 Pain in right knee: Secondary | ICD-10-CM | POA: Diagnosis not present

## 2021-03-25 MED ORDER — LIDOCAINE HCL 1 % IJ SOLN
4.0000 mL | Freq: Once | INTRAMUSCULAR | Status: AC
  Administered 2021-03-25: 4 mL

## 2021-03-25 MED ORDER — TRIAMCINOLONE ACETONIDE 40 MG/ML IJ SUSP
40.0000 mg | Freq: Once | INTRAMUSCULAR | Status: AC
  Administered 2021-03-25: 40 mg via INTRAMUSCULAR

## 2021-03-25 MED ORDER — TRIAMCINOLONE ACETONIDE 40 MG/ML IJ SUSP
40.0000 mg | Freq: Once | INTRAMUSCULAR | Status: AC
Start: 2021-03-25 — End: 2021-03-25
  Administered 2021-03-25: 40 mg via INTRAMUSCULAR

## 2021-03-25 NOTE — Patient Instructions (Signed)
Knee Injection ?A knee injection is a procedure to get medicine into your knee joint to relieve the pain, swelling, and stiffness of arthritis. Your health care provider uses a needle to inject medicine, which may also help to lubricate and cushion your knee joint. You may need more than one injection. ?Tell a health care provider about: ?Any allergies you have. ?All medicines you are taking, including vitamins, herbs, eye drops, creams, and over-the-counter medicines. ?Any problems you or family members have had with anesthetic medicines. ?Any blood disorders you have. ?Any surgeries you have had. ?Any medical conditions you have. ?Whether you are pregnant or may be pregnant. ?What are the risks? ?Generally, this is a safe procedure. However, problems may occur, including: ?Infection. ?Bleeding. ?Symptoms that get worse. ?Damage to the area around your knee. ?Allergic reaction to any of the medicines. ?Skin reactions from repeated injections. ?What happens before the procedure? ?Ask your health care provider about: ?Changing or stopping your regular medicines. This is especially important if you are taking diabetes medicines or blood thinners. ?Taking medicines such as aspirin and ibuprofen. These medicines can thin your blood. Do not take these medicines unless your health care provider tells you to take them. ?Taking over-the-counter medicines, vitamins, herbs, and supplements. ?Plan to have a responsible adult take you home from the hospital or clinic. ?What happens during the procedure? ? ?You will sit or lie down in a position for your knee to be treated. ?The skin over your kneecap will be cleaned with a germ-killing soap. ?You will be given a medicine that numbs the area (local anesthetic). You may feel some stinging. ?The medicine will be injected into your knee. The needle is carefully placed between your kneecap and your knee. The medicine is injected into the joint space. ?The needle will be removed at  the end of the procedure. ?A bandage (dressing) may be placed over the injection site. ?The procedure may vary among health care providers and hospitals. ?What can I expect after the procedure? ?Your blood pressure, heart rate, breathing rate, and blood oxygen level will be monitored until you leave the hospital or clinic. ?You may have to move your knee through its full range of motion. This helps to get all the medicine into your joint space. ?You will be watched to make sure that you do not have a reaction to the injected medicine. ?You may feel more pain, swelling, and warmth than you did before the injection. This reaction may last about 1-2 days. ?Follow these instructions at home: ?Medicines ?Take over-the-counter and prescription medicines only as told by your health care provider. ?Ask your health care provider if the medicine prescribed to you requires you to avoid driving or using machinery. ?Do not take medicines such as aspirin and ibuprofen unless your health care provider tells you to take them. ?Injection site care ?Follow instructions from your health care provider about: ?How to take care of your puncture site. ?When and how you should change your dressing. ?When you should remove your dressing. ?Check your injection area every day for signs of infection. Check for: ?More redness, swelling, or pain after 2 days. ?Fluid or blood. ?Pus or a bad smell. ?Warmth. ?Managing pain, stiffness, and swelling ? ?If directed, put ice on the injection area. To do this: ?Put ice in a plastic bag. ?Place a towel between your skin and the bag. ?Leave the ice on for 20 minutes, 2-3 times per day. ?Remove the ice if your skin turns bright red.   This is very important. If you cannot feel pain, heat, or cold, you have a greater risk of damage to the area. ?Do not apply heat to your knee. ?Raise (elevate) the injection area above the level of your heart while you are sitting or lying down. ?General instructions ?If you  were given a dressing, keep it dry until your health care provider says it can be removed. Ask your health care provider when you can start showering or bathing. ?Avoid strenuous activities for as long as directed by your health care provider. Ask your health care provider when you can return to your normal activities. ?Keep all follow-up visits. This is important. You may need more injections. ?Contact a health care provider if you have: ?A fever. ?Warmth in your injection area. ?Fluid, blood, or pus coming from your injection site. ?Symptoms at your injection site that last longer than 2 days after your procedure. ?Get help right away if: ?Your knee turns very red. ?Your knee becomes very swollen. ?Your knee is in severe pain. ?Summary ?A knee injection is a procedure to get medicine into your knee joint to relieve the pain, swelling, and stiffness of arthritis. ?A needle is carefully placed between your kneecap and your knee to inject medicine into the joint space. ?Before the procedure, ask your health care provider about changing or stopping your regular medicines, especially if you are taking diabetes medicines or blood thinners. ?Contact your health care provider if you have any problems or questions after your procedure. ?This information is not intended to replace advice given to you by your health care provider. Make sure you discuss any questions you have with your health care provider. ?Document Revised: 03/05/2020 Document Reviewed: 03/05/2020 ?Elsevier Patient Education ? 2022 [employer] ? ?

## 2021-03-25 NOTE — Progress Notes (Signed)
Subjective:  Patient ID: Tiffany Benjamin, female    DOB: 04/16/1933  Age: 87 y.o. MRN: [id number]  CC: The primary encounter diagnosis was Chronic pain of both knees. A diagnosis of Osteoarthritis of both knees, unspecified osteoarthritis type was also pertinent to this visit.  HPI [name] presents for follow up on hypertension,  cognitive decline  ad OA bilateral knees  This visit occurred during the SARS-CoV-2 public health emergency.  Safety protocols were in place, including screening questions prior to the visit, additional usage of staff PPE, and extensive cleaning of exam room while observing appropriate contact time as indicated for disinfecting solutions.    OA Knees:  she is walking with a cane.  No recent falls.  Reviewed conditions 1 step into house.  Needs shower bench and raised toilet seat.  Walk in shower  Family disagrees on her ability to perform her ADLs'  but sister has noticed DOE . Patient denies chest pain  , jaw  pain.  No orthopnea.  No edema . PATIENT WAS REFERRED TO CARDIOLOGY for evaluation in March but decline the appt and  DOES NOT WANT TO SEE A CARDIOLOGIST   Bilateral knee pain :  taking one tramadol daily.  2000 mg tylenol . Requesting bilateral steroid injections today.  Informed  consent obtained  BP improved with family involvement in medication administration  Outpatient Medications Prior to Visit  Medication Sig Dispense Refill   amLODipine (NORVASC) 10 MG tablet TAKE 1 TABLET (10 MG TOTAL) BY MOUTH DAILY. IN THE MORNING FOR HYPERTENSION 90 tablet 1   aspirin 81 MG tablet Take 81 mg by mouth daily.     busPIRone (BUSPAR) 10 MG tablet TAKE 1 TABLET BY MOUTH THREE TIMES A DAY 270 tablet 1   cholecalciferol (VITAMIN D3) 25 MCG (1000 UNIT) tablet Take 1,000 Units by mouth daily.     Cyanocobalamin (VITAMIN B-12 PO) Take by mouth daily.     hydrochlorothiazide (HYDRODIURIL) 25 MG tablet TAKE 1 TABLET (25 MG TOTAL) BY MOUTH DAILY. IN THE MORNING  FOR HYPERTENSION 90 tablet 3   Multiple Vitamins-Minerals (CENTRUM SILVER PO) Take by mouth daily.     sertraline (ZOLOFT) 25 MG tablet TAKE 1 TABLET (25 MG TOTAL) BY MOUTH DAILY. 90 tablet 1   simvastatin (ZOCOR) 20 MG tablet TAKE 1 TABLET BY MOUTH EVERY DAY IN THE EVENING 90 tablet 1   telmisartan (MICARDIS) 40 MG tablet Take 1 tablet (40 mg total) by mouth at bedtime. 90 tablet 1   traMADol (ULTRAM) 50 MG tablet TAKE 1 TABLET (50 MG TOTAL) BY MOUTH EVERY 6 (SIX) HOURS AS NEEDED. MAXIMUM 4 TABLETS DAILY 90 tablet 5   Ascorbic Acid (VITAMIN C PO) Take 1 tablet by mouth daily. (Patient not taking: Reported on 03/25/2021)     BIOTIN PO Take by mouth daily. (Patient not taking: Reported on 03/25/2021)     Omega 3 1000 MG CAPS 1 capsule twice daily with meals (Patient not taking: Reported on 03/25/2021) 360 each 3   VITAMIN D, CHOLECALCIFEROL, PO Take 5,000 mg by mouth daily. (Patient not taking: Reported on 03/25/2021)     VITAMIN E PO Take 1 capsule by mouth daily. (Patient not taking: Reported on 03/25/2021)     Facility-Administered Medications Prior to Visit  Medication Dose Route Frequency Provider Last Rate Last Admin   cloNIDine (CATAPRES) tablet 0.1 mg  0.1 mg Oral Once Sherlene Shams, MD        Review of Systems;  Patient denies headache, fevers, malaise, unintentional weight loss, skin rash, eye pain, sinus congestion and sinus pain, sore throat, dysphagia,  hemoptysis , cough, dyspnea, wheezing, chest pain, palpitations, orthopnea, edema, abdominal pain, nausea, melena, diarrhea, constipation, flank pain, dysuria, hematuria, urinary  Frequency, nocturia, numbness, tingling, seizures,  Focal weakness, Loss of consciousness,  Tremor, insomnia, depression, anxiety, and suicidal ideation.      Objective:  BP (!) 142/78 (BP Location: Left Arm, Patient Position: Sitting, Cuff Size: Normal)   Pulse 62   Temp (!) 96.6 F (35.9 C) (Temporal)   Resp 15   Ht 5' 1.5" (1.562 m)   Wt 174 lb 3.2  oz (79 kg)   SpO2 98%   BMI 32.38 kg/m   BP Readings from Last 3 Encounters:  03/25/21 (!) 142/78  12/19/20 (!) 152/72  11/10/20 (!) 178/90    Wt Readings from Last 3 Encounters:  03/25/21 174 lb 3.2 oz (79 kg)  12/19/20 177 lb 3.2 oz (80.4 kg)  11/10/20 185 lb 3.2 oz (84 kg)    General appearance: alert, cooperative and appears stated age Ears: normal TM's and external ear canals both ears Throat: lips, mucosa, and tongue normal; teeth and gums normal Neck: no adenopathy, no carotid bruit, supple, symmetrical, trachea midline and thyroid not enlarged, symmetric, no tenderness/mass/nodules Back: symmetric, no curvature. ROM normal. No CVA tenderness. Lungs: clear to auscultation bilaterally Heart: regular rate and rhythm, S1, S2 normal, no murmur, click, rub or gallop Abdomen: soft, non-tender; bowel sounds normal; no masses,  no organomegaly Pulses: 2+ and symmetric Knees:  no effusions,  erythema or warmth.  Skin: Skin color, texture, turgor normal. No rashes or lesions Lymph nodes: Cervical, supraclavicular, and axillary nodes normal.  Lab Results  Component Value Date   HGBA1C 5.7 02/07/2020   HGBA1C 5.9 06/26/2019   HGBA1C 5.7 06/02/2018    Lab Results  Component Value Date   CREATININE 1.22 (H) 12/19/2020   CREATININE 1.52 (H) 08/11/2020   CREATININE 1.42 (H) 06/27/2020    Lab Results  Component Value Date   WBC 6.5 12/19/2020   HGB 12.4 12/19/2020   HCT 36.5 12/19/2020   PLT 273.0 12/19/2020   GLUCOSE 82 12/19/2020   CHOL 190 12/19/2020   TRIG 95.0 12/19/2020   HDL 95.00 12/19/2020   LDLDIRECT 81.0 12/09/2016   LDLCALC 76 12/19/2020   ALT 17 12/19/2020   AST 19 12/19/2020   NA 142 12/19/2020   K 4.1 12/19/2020   CL 103 12/19/2020   CREATININE 1.22 (H) 12/19/2020   BUN 24 (H) 12/19/2020   CO2 29 12/19/2020   TSH 0.87 08/11/2020   HGBA1C 5.7 02/07/2020   MICROALBUR 10.3 (H) 06/02/2018    No results found.  Assessment & Plan:   Problem List  Items Addressed This Visit       Unprioritized   Knee pain, bilateral - Primary    Continue tylenol and tramadol for pain control.   Handicapped application for parking space initiated and shower chair DME also written. Patient has requested  steroid injections in both knees  for relief of pain not controlled with oral medications.    Informed consent for joint injection obtained after discussion of the risks of infection and bleeding were reviewed  .  Medial side of left knee  was cleaned with betadine an alcohol swabs .  Medial side of left knee  was injected with 40 mg Kenalog and 4 ml of 1% Xylocaine with a 22 gauge needle under sterile  conditions.    After obtaining informed consent for  an I/A injection of the right knee,  The right knee was cleaned with betadine and alcohol.  Topical anesthetic was sprayed on medial side of patella and 40 mg Kenalog mixed with 4 ml 1% lidocaine was injected without difficulty into the bursa.   Patient tolerated the procedures without complications or bleeding.  Patient was advised to apply ice for 15 minutes every few hours for the first 24 hours,  to avoid strenuous activity for up to 7 days  And advised to call if she develops signs of infection (REDNESS, PAIN,  WARMTH)         Relevant Orders   For home use only DME Other see comment   For home use only DME Other see comment   For home use only DME 4 wheeled rolling walker with seat ([id number])   Other Visit Diagnoses     Osteoarthritis of both knees, unspecified osteoarthritis type       Relevant Medications   triamcinolone acetonide (KENALOG-40) injection 40 mg (Completed)   triamcinolone acetonide (KENALOG-40) injection 40 mg (Completed)   Other Relevant Orders   For home use only DME Other see comment   For home use only DME Other see comment   For home use only DME 4 wheeled rolling walker with seat ([id number])       I have discontinued Laurey Morale. Villagomez's Omega 3, (VITAMIN D,  CHOLECALCIFEROL, PO), Ascorbic Acid (VITAMIN C PO), VITAMIN E PO, and BIOTIN PO. I am also having her maintain her aspirin, Cyanocobalamin (VITAMIN B-12 PO), Multiple Vitamins-Minerals (CENTRUM SILVER PO), hydrochlorothiazide, amLODipine, simvastatin, traMADol, telmisartan, busPIRone, sertraline, and cholecalciferol. We administered lidocaine, lidocaine, triamcinolone acetonide, and triamcinolone acetonide. We will continue to administer cloNIDine.  Meds ordered this encounter  Medications   lidocaine (XYLOCAINE) 1 % (with pres) injection 4 mL   lidocaine (XYLOCAINE) 1 % (with pres) injection 4 mL   triamcinolone acetonide (KENALOG-40) injection 40 mg   triamcinolone acetonide (KENALOG-40) injection 40 mg    Medications Discontinued During This Encounter  Medication Reason   Ascorbic Acid (VITAMIN C PO)    BIOTIN PO    Omega 3 1000 MG CAPS    VITAMIN D, CHOLECALCIFEROL, PO    VITAMIN E PO     Follow-up: No follow-ups on file.   Sherlene Shams, MD

## 2021-03-28 NOTE — Assessment & Plan Note (Signed)
Continue tylenol and tramadol for pain control.   Handicapped application for parking space initiated and shower chair DME also written. Patient has requested  steroid injections in both knees  for relief of pain not controlled with oral medications.    Informed consent for joint injection obtained after discussion of the risks of infection and bleeding were reviewed  .  Medial side of left knee  was cleaned with betadine an alcohol swabs .  Medial side of left knee  was injected with 40 mg Kenalog and 4 ml of 1% Xylocaine with a 22 gauge needle under sterile conditions.    After obtaining informed consent for  an I/A injection of the right knee,  The right knee was cleaned with betadine and alcohol.  Topical anesthetic was sprayed on medial side of patella and 40 mg Kenalog mixed with 4 ml 1% lidocaine was injected without difficulty into the bursa.   Patient tolerated the procedures without complications or bleeding.  Patient was advised to apply ice for 15 minutes every few hours for the first 24 hours,  to avoid strenuous activity for up to 7 days  And advised to call if she develops signs of infection (REDNESS, PAIN,  WARMTH)

## 2021-05-16 ENCOUNTER — Other Ambulatory Visit: Payer: Self-pay | Admitting: Internal Medicine

## 2021-05-18 NOTE — Telephone Encounter (Signed)
Medication has been discontinued.

## 2021-05-19 NOTE — Telephone Encounter (Signed)
She should still be taking metoprolol.  Refilled

## 2021-05-25 ENCOUNTER — Other Ambulatory Visit: Payer: Self-pay | Admitting: Internal Medicine

## 2021-05-25 NOTE — Telephone Encounter (Signed)
RX Refill:tramadol Last Seen:03-25-21 Last ordered:11-10-20

## 2021-06-24 ENCOUNTER — Ambulatory Visit (INDEPENDENT_AMBULATORY_CARE_PROVIDER_SITE_OTHER): Payer: Medicare HMO

## 2021-06-24 ENCOUNTER — Telehealth: Payer: Self-pay

## 2021-06-24 VITALS — Ht 61.5 in | Wt 174.0 lb

## 2021-06-24 DIAGNOSIS — Z0000 Encounter for general adult medical examination without abnormal findings: Secondary | ICD-10-CM

## 2021-06-24 NOTE — Progress Notes (Addendum)
Subjective:   Tiffany Benjamin is a 87 y.o. female who presents for Medicare Annual (Subsequent) preventive examination.  Review of Systems    No ROS.  Medicare Wellness Virtual Visit.  Visual/audio telehealth visit, UTA vital signs.   See social history for additional risk factors.   Cardiac Risk Factors include: advanced age (>74men, >68 women)     Objective:    Today's Vitals   06/24/21 1338  Weight: 174 lb (78.9 kg)  Height: 5' 1.5" (1.562 m)   Body mass index is 32.34 kg/m.  Advanced Directives 06/24/2021 06/23/2020 12/31/2019 12/10/2019 06/13/2019 06/02/2018 03/31/2017  Does Patient Have a Medical Advance Directive? Yes Yes Yes Yes No Yes Yes  Type of Estate agent of [place];Living will Healthcare Power of [employer] of [place];Living will Living will;Healthcare Power of [address] - Healthcare Power of [place];Living will Healthcare Power of Attorney  Does patient want to make changes to medical advance directive? No - Patient declined No - Patient declined No - Patient declined - - No - Patient declined No - Patient declined  Copy of Healthcare Power of Attorney in Chart? No - copy requested No - copy requested No - copy requested Yes - validated most recent copy scanned in chart (See row information) - No - copy requested No - copy requested  Would patient like information on creating a medical advance directive? - - - - No - Patient declined - -    Current Medications (verified) Outpatient Encounter Medications as of 06/24/2021  Medication Sig   amLODipine (NORVASC) 10 MG tablet TAKE 1 TABLET (10 MG TOTAL) BY MOUTH DAILY. IN THE MORNING FOR HYPERTENSION   aspirin 81 MG tablet Take 81 mg by mouth daily.   busPIRone (BUSPAR) 10 MG tablet TAKE 1 TABLET BY MOUTH THREE TIMES A DAY   cholecalciferol (VITAMIN D3) 25 MCG (1000 UNIT) tablet Take 1,000 Units by mouth daily.   Cyanocobalamin (VITAMIN B-12 PO) Take by mouth daily.    hydrochlorothiazide (HYDRODIURIL) 25 MG tablet TAKE 1 TABLET (25 MG TOTAL) BY MOUTH DAILY. IN THE MORNING FOR HYPERTENSION   metoprolol succinate (TOPROL-XL) 50 MG 24 hr tablet TAKE 1 TABLET (50 MG TOTAL) BY MOUTH AT BEDTIME. FOR HYPERTENSION   Multiple Vitamins-Minerals (CENTRUM SILVER PO) Take by mouth daily.   sertraline (ZOLOFT) 25 MG tablet TAKE 1 TABLET (25 MG TOTAL) BY MOUTH DAILY.   simvastatin (ZOCOR) 20 MG tablet TAKE 1 TABLET BY MOUTH EVERY DAY IN THE EVENING   telmisartan (MICARDIS) 40 MG tablet TAKE 1 TABLET BY MOUTH EVERYDAY AT BEDTIME   traMADol (ULTRAM) 50 MG tablet TAKE 1 TABLET (50 MG TOTAL) BY MOUTH EVERY 6 (SIX) HOURS AS NEEDED. MAXIMUM 4 TABLETS DAILY   Facility-Administered Encounter Medications as of 06/24/2021  Medication   cloNIDine (CATAPRES) tablet 0.1 mg    Allergies (verified) Patient has no known allergies.   History: Past Medical History:  Diagnosis Date   Arthritis    knees   Depression    Family history of polyps in the colon    History of chicken pox    History of colonic polyps    Hyperlipidemia    Hypertension    Ulcer    UTI (urinary tract infection)    Past Surgical History:  Procedure Laterality Date   BREAST SURGERY Right 2001   bernign lumpectomy    CATARACT EXTRACTION W/PHACO Left 12/10/2019   Procedure: CATARACT EXTRACTION PHACO AND INTRAOCULAR LENS PLACEMENT (IOC) LEFT;  Surgeon: Brooke Dare,  Earl Gala, MD;  Location: [facility];  Service: Ophthalmology;  Laterality: Left;  CDE 3.55 U/S 0:45.9   CATARACT EXTRACTION W/PHACO Right 12/31/2019   Procedure: CATARACT EXTRACTION PHACO AND INTRAOCULAR LENS PLACEMENT (IOC) RIGHT;  Surgeon: Nevada Crane, MD;  Location: [facility];  Service: Ophthalmology;  Laterality: Right;  4.11 0:40.1   Family History  Problem Relation Age of Onset   Stroke Mother 33       cerebral hemorrhage   Hypertension Mother    Diabetes Mother    Heart disease Father    Heart attack Father 27    Cancer Sister        pancreatic   Cancer Sister 65       retroperitoneal Ca removed, doing fine    Hyperlipidemia Son    Bone cancer Son    Diabetes Son    Social History   Socioeconomic History   Marital status: Widowed    Spouse name: Not on file   Number of children: Not on file   Years of education: Not on file   Highest education level: Not on file  Occupational History   Not on file  Tobacco Use   Smoking status: Never   Smokeless tobacco: Never  Vaping Use   Vaping Use: Never used  Substance and Sexual Activity   Alcohol use: Yes    Alcohol/week: 5.0 standard drinks    Types: 5 Glasses of wine per week    Comment: occasional   Drug use: No   Sexual activity: Never  Other Topics Concern   Not on file  Social History Narrative   Social:  Her husband died about 20 years ago from throat cancer.  She then cared for her father for  20 yrs in her home before he passed.    She is retired 20 yrs from [employer]. 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 Likes to stay busy.  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   Exercises regularly, goes to [employer] 3 days a week and stretches before hand.         Social Determinants of Health   Financial Resource Strain: Low Risk    Difficulty of Paying Living Expenses: Not hard at all  Food Insecurity: No Food Insecurity   Worried About Programme researcher, broadcasting/film/video in the Last Year: Never true   Ran Out of Food in the Last Year: Never true  Transportation Needs: No Transportation Needs   Lack of Transportation (Medical): No   Lack of Transportation (Non-Medical): No  Physical Activity: Sufficiently Active   Days of Exercise per Week: 5 days   Minutes of Exercise per Session: 30 min  Stress: No Stress Concern Present   Feeling of Stress : Not at all  Social Connections: Unknown   Frequency of Communication with Friends and Family: Not on file   Frequency of Social Gatherings with Friends and Family: More than  three times a week   Attends Religious Services: Not on Scientist, clinical (histocompatibility and immunogenetics) or Organizations: Yes   Attends Banker Meetings: Not on file   Marital Status: Not on file    Tobacco Counseling Counseling given: Not Answered   Clinical Intake:  Pre-visit preparation completed: No        Diabetes: No  How often do you need to have someone help you when you read instructions, pamphlets, or other written materials from your doctor  or pharmacy?: 1 - Never    Interpreter Needed?: No      Activities of Daily Living In your present state of health, do you have any difficulty performing the following activities: 06/24/2021  Hearing? N  Vision? N  Difficulty concentrating or making decisions? N  Walking or climbing stairs? Y  Comment Walker in use in the home when tired.  Dressing or bathing? N  Doing errands, shopping? N  Preparing Food and eating ? Y  Comment Daughter assist with meal prep. Self feeds.  Using the Toilet? N  In the past six months, have you accidently leaked urine? N  Do you have problems with loss of bowel control? N  Managing your Medications? N  Managing your Finances? N  Housekeeping or managing your Housekeeping? N  Some recent data might be hidden    Patient Care Team: Sherlene Shams, MD as PCP - General (Internal Medicine) Sherlene Shams, MD (Internal Medicine)  Indicate any recent Medical Services you may have received from other than [facility] providers in the past year (date may be approximate).     Assessment:   This is a routine wellness examination for Tiffany Benjamin.  I connected with Tiffany Benjamin today by telephone and verified that I am speaking with the correct person using two identifiers. Location patient: home Location provider: work Persons participating in the virtual visit: patient, Engineer, civil (consulting).    I discussed the limitations, risks, security and privacy concerns of performing an evaluation and management service by telephone  and the availability of in person appointments. The patient expressed understanding and verbally consented to this telephonic visit.    Interactive audio and video telecommunications were attempted between this provider and patient, however failed, due to patient having technical difficulties OR patient did not have access to video capability.  We continued and completed visit with audio only.  Some vital signs may be absent or patient reported.   Hearing/Vision screen Hearing Screening - Comments:: Patient is able to hear conversational tones without difficulty.  No issues reported. Vision Screening - Comments:: Followed by [facility] Wears corrective lenses They have regular follow up with the ophthalmologist  Dietary issues and exercise activities discussed: Current Exercise Habits: Home exercise routine, Type of exercise: walking;treadmill (Climbing stairs twice daily), Time (Minutes): 30, Frequency (Times/Week): 5, Weekly Exercise (Minutes/Week): 150, Intensity: Mild Healthy diet Good fluid intake  Goals Addressed             This Visit's Progress    LIFESTYLE - DECREASE FALLS RISK   On track    Do not run       Depression Screen PHQ 2/9 Scores 06/24/2021 03/25/2021 11/10/2020 10/13/2020 06/27/2020 06/23/2020 06/13/2019  PHQ - 2 Score 0 1 0 0 0 0 0  PHQ- 9 Score - 6 - - - - -    Fall Risk Fall Risk  06/24/2021 03/25/2021 11/10/2020 10/13/2020 08/11/2020  Falls in the past year? - 1 0 0 0  Number falls in past yr: - 0 0 0 -  Comment - - - - -  Injury with Fall? - 1 0 0 -  Follow up Falls evaluation completed Falls evaluation completed Falls evaluation completed Falls evaluation completed Falls evaluation completed    FALL RISK PREVENTION PERTAINING TO THE HOME: Adequate lighting in your home to reduce risk of falls? Yes   ASSISTIVE DEVICES UTILIZED TO PREVENT FALLS: Use of a cane, walker or w/c? Yes  walker as needed while in the home. Does  not use outdoors.   TIMED UP  AND GO: Was the test performed? No .   Cognitive Function: Patient is alert.  MMSE - Mini Mental State Exam 03/10/2016  Orientation to time 5  Orientation to Place 5  Registration 3  Attention/ Calculation 5  Recall 3  Language- name 2 objects 2  Language- repeat 1  Language- follow 3 step command 3  Language- read & follow direction 1  Write a sentence 1  Copy design 1  Total score 30     6CIT Screen 06/24/2021 06/13/2019 06/02/2018 03/31/2017  What Year? 0 points 0 points 0 points 0 points  What month? 0 points 0 points 0 points 0 points  What time? 0 points 0 points 0 points 0 points  Count back from 20 - 0 points 0 points 0 points  Months in reverse - 0 points 2 points 0 points  Repeat phrase - - - 0 points  Total Score - - - 0    Immunizations Immunization History  Administered Date(s) Administered   Fluad Quad(high Dose 65+) 06/19/2019, 06/27/2020   Influenza, High Dose Seasonal PF 11/25/2015, 06/07/2018   Influenza,inj,Quad PF,6+ Mos 07/11/2013, 08/04/2016   Influenza-Unspecified 10/04/2012   Moderna Sars-Covid-2 Vaccination 10/30/2019, 11/20/2019   PFIZER(Purple Top)SARS-COV-2 Vaccination 07/16/2020   Pneumococcal Conjugate-13 02/19/2015   Pneumococcal Polysaccharide-23 03/04/2017   Pneumococcal-Unspecified 10/05/2011   Td 10/05/2007   Tdap 06/26/2019   Zoster, Live 07/01/2012   Influenza vaccine- plans to receive later in the season.   Health Maintenance Health Maintenance  Topic Date Due   COVID-19 Vaccine (4 - Booster) 07/10/2021 (Originally 11/16/2020)   Zoster Vaccines- Shingrix (1 of 2) 09/23/2021 (Originally 11/28/1984)   INFLUENZA VACCINE  01/01/2022 (Originally 05/04/2021)   DEXA SCAN  06/24/2022 (Originally 11/29/1999)   TETANUS/TDAP  06/25/2029   HPV VACCINES  Aged Out   Colorectal cancer screening: No longer required.   Mammogram status: No longer required due to aged out.  Bone density- deferred.   Hepatitis C Screening: does not  qualify  Vision Screening: Recommended annual ophthalmology exams for early detection of glaucoma and other disorders of the eye.  Dental Screening: Recommended annual dental exams for proper oral hygiene  Community Resource Referral / Chronic Care Management: CRR required this visit?  No   CCM required this visit?  No      Plan:   Keep all routine maintenance appointments.   I have personally reviewed and noted the following in the patient's chart:   Medical and social history Use of alcohol, tobacco or illicit drugs  Current medications and supplements including opioid prescriptions. Taking Ultram. Followed by pcp.  Functional ability and status Nutritional status Physical activity Advanced directives List of other physicians Hospitalizations, surgeries, and ER visits in previous 12 months Vitals Screenings to include cognitive, depression, and falls Referrals and appointments  In addition, I have reviewed and discussed with patient certain preventive protocols, quality metrics, and best practice recommendations. A written personalized care plan for preventive services as well as general preventive health recommendations were provided to patient via mail.     OBrien-Blaney, Arionna Hoggard L, LPN   [id number]     I have reviewed the above information and agree with above.   Duncan Dull, MD

## 2021-06-24 NOTE — Telephone Encounter (Signed)
Unable to reach patient on preferred number for scheduled AWV. No answer. Unable to leave message. No voicemail, phone rings continuously. Reschedule.

## 2021-06-24 NOTE — Patient Instructions (Addendum)
Tiffany Benjamin , Thank you for taking time to come for your Medicare Wellness Visit. I appreciate your ongoing commitment to your health goals. Please review the following plan we discussed and let me know if I can assist you in the future.   These are the goals we discussed:  Goals      LIFESTYLE - DECREASE FALLS RISK     Do not run        This is a list of the screening recommended for you and due dates:  Health Maintenance  Topic Date Due   COVID-19 Vaccine (4 - Booster) 07/10/2021*   Zoster (Shingles) Vaccine (1 of 2) 09/23/2021*   Flu Shot  01/01/2022*   DEXA scan (bone density measurement)  06/24/2022*   Tetanus Vaccine  06/25/2029   HPV Vaccine  Aged Out  *Topic was postponed. The date shown is not the original due date.    Advanced directives: End of life planning; Advance aging; Advanced directives discussed.  Copy of current HCPOA/Living Will requested.    Conditions/risks identified: none new  Next appointment: Follow up in one year for your annual wellness visit    Preventive Care 65 Years and Older, Female Preventive care refers to lifestyle choices and visits with your health care provider that can promote health and wellness. What does preventive care include? A yearly physical exam. This is also called an annual well check. Dental exams once or twice a year. Routine eye exams. Ask your health care provider how often you should have your eyes checked. Personal lifestyle choices, including: Daily care of your teeth and gums. Regular physical activity. Eating a healthy diet. Avoiding tobacco and drug use. Limiting alcohol use. Practicing safe sex. Taking low-dose aspirin every day. Taking vitamin and mineral supplements as recommended by your health care provider. What happens during an annual well check? The services and screenings done by your health care provider during your annual well check will depend on your age, overall health, lifestyle risk factors,  and family history of disease. Counseling  Your health care provider may ask you questions about your: Alcohol use. Tobacco use. Drug use. Emotional well-being. Home and relationship well-being. Sexual activity. Eating habits. History of falls. Memory and ability to understand (cognition). Work and work Astronomer. Reproductive health. Screening  You may have the following tests or measurements: Height, weight, and BMI. Blood pressure. Lipid and cholesterol levels. These may be checked every 5 years, or more frequently if you are over 57 years old. Skin check. Lung cancer screening. You may have this screening every year starting at age 37 if you have a 30-pack-year history of smoking and currently smoke or have quit within the past 15 years. Fecal occult blood test (FOBT) of the stool. You may have this test every year starting at age 50. Flexible sigmoidoscopy or colonoscopy. You may have a sigmoidoscopy every 5 years or a colonoscopy every 10 years starting at age 74. Hepatitis C blood test. Hepatitis B blood test. Sexually transmitted disease (STD) testing. Diabetes screening. This is done by checking your blood sugar (glucose) after you have not eaten for a while (fasting). You may have this done every 1-3 years. Bone density scan. This is done to screen for osteoporosis. You may have this done starting at age 50. Mammogram. This may be done every 1-2 years. Talk to your health care provider about how often you should have regular mammograms. Talk with your health care provider about your test results, treatment options, and  if necessary, the need for more tests. Vaccines  Your health care provider may recommend certain vaccines, such as: Influenza vaccine. This is recommended every year. Tetanus, diphtheria, and acellular pertussis (Tdap, Td) vaccine. You may need a Td booster every 10 years. Zoster vaccine. You may need this after age 22. Pneumococcal 13-valent conjugate  (PCV13) vaccine. One dose is recommended after age 40. Pneumococcal polysaccharide (PPSV23) vaccine. One dose is recommended after age 55. Talk to your health care provider about which screenings and vaccines you need and how often you need them. This information is not intended to replace advice given to you by your health care provider. Make sure you discuss any questions you have with your health care provider. Document Released: 10/17/2015 Document Revised: 06/09/2016 Document Reviewed: 07/22/2015 Elsevier Interactive Patient Education  2017 [place] Prevention in the Home Falls can cause injuries. They can happen to people of all ages. There are many things you can do to make your home safe and to help prevent falls. What can I do on the outside of my home? Regularly fix the edges of walkways and driveways and fix any cracks. Remove anything that might make you trip as you walk through a door, such as a raised step or threshold. Trim any bushes or trees on the path to your home. Use bright outdoor lighting. Clear any walking paths of anything that might make someone trip, such as rocks or tools. Regularly check to see if handrails are loose or broken. Make sure that both sides of any steps have handrails. Any raised decks and porches should have guardrails on the edges. Have any leaves, snow, or ice cleared regularly. Use sand or salt on walking paths during winter. Clean up any spills in your garage right away. This includes oil or grease spills. What can I do in the bathroom? Use night lights. Install grab bars by the toilet and in the tub and shower. Do not use towel bars as grab bars. Use non-skid mats or decals in the tub or shower. If you need to sit down in the shower, use a plastic, non-slip stool. Keep the floor dry. Clean up any water that spills on the floor as soon as it happens. Remove soap buildup in the tub or shower regularly. Attach bath mats securely with  double-sided non-slip rug tape. Do not have throw rugs and other things on the floor that can make you trip. What can I do in the bedroom? Use night lights. Make sure that you have a light by your bed that is easy to reach. Do not use any sheets or blankets that are too big for your bed. They should not hang down onto the floor. Have a firm chair that has side arms. You can use this for support while you get dressed. Do not have throw rugs and other things on the floor that can make you trip. What can I do in the kitchen? Clean up any spills right away. Avoid walking on wet floors. Keep items that you use a lot in easy-to-reach places. If you need to reach something above you, use a strong step stool that has a grab bar. Keep electrical cords out of the way. Do not use floor polish or wax that makes floors slippery. If you must use wax, use non-skid floor wax. Do not have throw rugs and other things on the floor that can make you trip. What can I do with my stairs? Do not leave any  items on the stairs. Make sure that there are handrails on both sides of the stairs and use them. Fix handrails that are broken or loose. Make sure that handrails are as long as the stairways. Check any carpeting to make sure that it is firmly attached to the stairs. Fix any carpet that is loose or worn. Avoid having throw rugs at the top or bottom of the stairs. If you do have throw rugs, attach them to the floor with carpet tape. Make sure that you have a light switch at the top of the stairs and the bottom of the stairs. If you do not have them, ask someone to add them for you. What else can I do to help prevent falls? Wear shoes that: Do not have high heels. Have rubber bottoms. Are comfortable and fit you well. Are closed at the toe. Do not wear sandals. If you use a stepladder: Make sure that it is fully opened. Do not climb a closed stepladder. Make sure that both sides of the stepladder are locked  into place. Ask someone to hold it for you, if possible. Clearly mark and make sure that you can see: Any grab bars or handrails. First and last steps. Where the edge of each step is. Use tools that help you move around (mobility aids) if they are needed. These include: Canes. Walkers. Scooters. Crutches. Turn on the lights when you go into a dark area. Replace any light bulbs as soon as they burn out. Set up your furniture so you have a clear path. Avoid moving your furniture around. If any of your floors are uneven, fix them. If there are any pets around you, be aware of where they are. Review your medicines with your doctor. Some medicines can make you feel dizzy. This can increase your chance of falling. Ask your doctor what other things that you can do to help prevent falls. This information is not intended to replace advice given to you by your health care provider. Make sure you discuss any questions you have with your health care provider. Document Released: 07/17/2009 Document Revised: 02/26/2016 Document Reviewed: 10/25/2014 Elsevier Interactive Patient Education  2017 [employer]  Opioid Pain Medicine Management Opioid pain medicines are strong medicines that are used to treat bad or very bad pain. When you take them for a short time, they can help you: Sleep better. Do better in physical therapy. Feel better during the first few days after you get hurt. Recover from surgery. Only take these medicines if a doctor says that you can. You should only take them for a short time. This is because opioids can be very addictive. This means that they are hard to stop taking. The longer you take opioids, the harder it may be to stop taking them. What are the risks? Opioids can cause problems (side effects). Taking them for more than 3 days raises your chance of problems, such as: Trouble pooping (constipation). Feeling sick to your stomach (nausea). Vomiting. Feeling very  sleepy. Confusion. Not being able to stop taking the medicine. Breathing problems. Taking opioids for a long time can make it hard for you to do daily tasks. It can also put you at risk for: Car accidents. Depression. Suicide. Heart attack. Taking too much of the medicine (overdose). This can lead to death. What is a pain treatment plan? A pain treatment plan is a plan made by you and your doctor. Work with your doctor to make a plan for treating your  pain. To help you do this: Talk about the goals of your treatment, including: How much pain you might expect to have. How you will manage the pain. Talk about the risks and benefits of taking these medicines for your condition. Remember that a good treatment plan uses more than one approach and lowers the risks of side effects. Tell your doctor about the amount of medicines you take and about any drug or alcohol use. Get your pain medicine prescriptions from only one doctor. Pain can be managed with other treatments. Work with your doctor to find other ways to help your pain, such as: Physical therapy or doing gentle exercises. Counseling. Eating healthy foods. Massage. Meditation. Other pain medicines. How to use opioid pain medicine safely Taking medicine Take your pain medicine exactly as told by your doctor. Take it only when you need it. If your pain is not too bad, you may take less medicine if your doctor allows. If you have no pain, do not take the medicine unless your doctor tells you to take it. If your pain is very bad, do not take more medicine than your doctor told you to take. Call your doctor to know what to do. Write down the times when you take your pain medicine. Look at the times before you take your next dose. Take other over-the-counter or prescription medicines only as told by your doctor. Keeping yourself and others safe  While you are taking opioids: Do not drive, use machines, or power tools. Do not sign  important papers (legal documents). Do not drink alcohol. Do not take sleeping pills. Do not take care of children by yourself. Do not do activities where you need to climb or be in high places, like working on a ladder. Do not go to a lake, river, ocean, swimming pool, or hot tub. Keep your opioids locked up or in a place where children cannot reach them. Do not share your pain medicine with anyone. Stopping your use of opioids If you have been taking opioids for more than a few weeks, you may need to slowly decrease (taper) how much you take until you stop taking them. Doing this can lower your chance of having symptoms.  Symptoms that come from suddenly stopping the use of opioids include: Pain and cramping in your belly (abdomen). Feeling sick to your stomach (nausea).z Sweating. Feeling very sleepy. Feeling restless. Shaking you cannot control (tremors). Cravings for the medicine. Do not try to stop taking them by yourself. Work with your doctor to stop. Your doctor will help you take less until you are not taking the medicine at all. Getting rid of unused pills Do not save any pills that you did not use. Get rid of the pills by: Taking them to a take-back program in your area. Bringing them to a pharmacy that receives unused pills. Flushing them down the toilet. Check the label or package insert of your medicine to see whether this is safe to do. Throwing them in the trash. Check the label or package insert of your medicine to see whether this is safe to do. If it is safe to throw them out: Take the pills out of their container. Put the pills into a container you can seal. Mix the pills with used coffee grounds, food scraps, dirt, or cat litter. Put this in the trash. Follow these instructions at home: Activity Do exercises as told by your doctor. Avoid doing things that make your pain worse. Return to your normal  activities as told by your doctor. Ask your doctor what  activities are safe for you. General instructions You may need to take these actions to prevent or treat constipation: Drink enough fluid to keep your pee (urine) pale yellow. Take over-the-counter or prescription medicines. Eat foods that are high in fiber. These include beans, whole grains, and fresh fruits and vegetables. Limit foods that are high in fat and sugar. These include fried or sweet foods. Keep all follow-up visits. Where to find support If you have been taking opioids for a long time, get help from a local support group or counselor. Ask your doctor about this. Where to find more information Centers for Disease Control and Prevention (CDC): [employer]Exhibition.com.br [place] Food and Drug Administration (FDA): PumpkinSearch.com.ee Get help right away if: You may have taken too much of an opioid (overdosed). Common symptoms of an overdose: Your breathing is slower or more shallow than normal. You have a very slow heartbeat. Your speech is not normal. You vomit or you feel as if you may vomit. The black centers of your eyes (pupils) are smaller than normal. You have other potential symptoms: You feel very confused. You faint. You are very sleepy. You have cold skin. You have blue lips or fingernails. You have thoughts of harming yourself or harming others. These symptoms may be an emergency. Get help right away. Call your local emergency services (911 in the [place] Do not wait to see if the symptoms will go away. Do not drive yourself to the hospital. Get help right away if you feel like you may hurt yourself or others, or have thoughts about taking your own life. Go to your nearest emergency room or: Call your local emergency services (911 in the [place] Call the [facility] at (850) [phone]. Call a suicide crisis helpline, such as the National Suicide Prevention Lifeline at (586)066-3009. This is open 24 hours a day. Text the Crisis Text Line at 731-244-5297. Summary Opioid are  strong medicines that are used to treat bad or very bad pain. A pain treatment plan is a plan made by you and your doctor. Work with your doctor to make a plan for treating your pain. If you think that you or someone else may have taken too much of an opioid, get help right away. This information is not intended to replace advice given to you by your health care provider. Make sure you discuss any questions you have with your health care provider. Document Revised: 12/31/2020 Document Reviewed: 12/31/2020 Elsevier Patient Education  2022 [employer].

## 2021-06-26 ENCOUNTER — Ambulatory Visit: Payer: Medicare HMO | Admitting: Internal Medicine

## 2021-06-26 ENCOUNTER — Other Ambulatory Visit: Payer: Self-pay | Admitting: Internal Medicine

## 2021-07-07 ENCOUNTER — Ambulatory Visit: Payer: Medicare HMO | Admitting: Internal Medicine

## 2021-07-15 ENCOUNTER — Other Ambulatory Visit: Payer: Self-pay

## 2021-07-15 ENCOUNTER — Ambulatory Visit (INDEPENDENT_AMBULATORY_CARE_PROVIDER_SITE_OTHER): Payer: Medicare HMO | Admitting: Internal Medicine

## 2021-07-15 ENCOUNTER — Encounter: Payer: Self-pay | Admitting: Internal Medicine

## 2021-07-15 VITALS — BP 164/72 | HR 64 | Temp 96.8°F | Ht 61.5 in | Wt 171.4 lb

## 2021-07-15 DIAGNOSIS — R079 Chest pain, unspecified: Secondary | ICD-10-CM

## 2021-07-15 DIAGNOSIS — Z23 Encounter for immunization: Secondary | ICD-10-CM

## 2021-07-15 DIAGNOSIS — R7303 Prediabetes: Secondary | ICD-10-CM | POA: Diagnosis not present

## 2021-07-15 DIAGNOSIS — M170 Bilateral primary osteoarthritis of knee: Secondary | ICD-10-CM

## 2021-07-15 DIAGNOSIS — I10 Essential (primary) hypertension: Secondary | ICD-10-CM

## 2021-07-15 DIAGNOSIS — N1832 Chronic kidney disease, stage 3b: Secondary | ICD-10-CM

## 2021-07-15 DIAGNOSIS — E785 Hyperlipidemia, unspecified: Secondary | ICD-10-CM | POA: Diagnosis not present

## 2021-07-15 LAB — COMPREHENSIVE METABOLIC PANEL
ALT: 12 U/L (ref 0–35)
AST: 19 U/L (ref 0–37)
Albumin: 4.2 g/dL (ref 3.5–5.2)
Alkaline Phosphatase: 70 U/L (ref 39–117)
BUN: 22 mg/dL (ref 6–23)
CO2: 30 mEq/L (ref 19–32)
Calcium: 9.4 mg/dL (ref 8.4–10.5)
Chloride: 103 mEq/L (ref 96–112)
Creatinine, Ser: 1.44 mg/dL — ABNORMAL HIGH (ref 0.40–1.20)
GFR: 32.91 mL/min — ABNORMAL LOW (ref >60.00)
Glucose, Bld: 84 mg/dL (ref 70–99)
Potassium: 4.1 mEq/L (ref 3.5–5.1)
Sodium: 142 mEq/L (ref 135–145)
Total Bilirubin: 0.5 mg/dL (ref 0.2–1.2)
Total Protein: 7.3 g/dL (ref 6.0–8.3)

## 2021-07-15 LAB — CBC WITH DIFFERENTIAL/PLATELET
Basophils Absolute: 0 10*3/uL (ref 0.0–0.1)
Basophils Relative: 0.8 % (ref 0.0–3.0)
Eosinophils Absolute: 0.2 10*3/uL (ref 0.0–0.7)
Eosinophils Relative: 3.4 % (ref 0.0–5.0)
HCT: 37.3 % (ref 36.0–46.0)
Hemoglobin: 12.2 g/dL (ref 12.0–15.0)
Lymphocytes Relative: 32.4 % (ref 12.0–46.0)
Lymphs Abs: 1.9 10*3/uL (ref 0.7–4.0)
MCHC: 32.8 g/dL (ref 30.0–36.0)
MCV: 98 fl (ref 78.0–100.0)
Monocytes Absolute: 0.6 10*3/uL (ref 0.1–1.0)
Monocytes Relative: 11.1 % (ref 3.0–12.0)
Neutro Abs: 3 10*3/uL (ref 1.4–7.7)
Neutrophils Relative %: 52.3 % (ref 43.0–77.0)
Platelets: 248 10*3/uL (ref 150.0–400.0)
RBC: 3.81 Mil/uL — ABNORMAL LOW (ref 3.87–5.11)
RDW: 15.4 % (ref 11.5–15.5)
WBC: 5.7 10*3/uL (ref 4.0–10.5)

## 2021-07-15 LAB — MICROALBUMIN / CREATININE URINE RATIO
Creatinine,U: 269 mg/dL
Microalb Creat Ratio: 1.9 mg/g (ref 0.0–30.0)
Microalb, Ur: 5.2 mg/dL — ABNORMAL HIGH (ref 0.0–1.9)

## 2021-07-15 LAB — LIPID PANEL
Cholesterol: 180 mg/dL (ref 0–200)
HDL: 83.7 mg/dL (ref >39.00)
LDL Cholesterol: 80 mg/dL (ref 0–99)
NonHDL: 96.42
Total CHOL/HDL Ratio: 2
Triglycerides: 82 mg/dL (ref 0.0–149.0)
VLDL: 16.4 mg/dL (ref 0.0–40.0)

## 2021-07-15 LAB — HEMOGLOBIN A1C: Hgb A1c MFr Bld: 5.7 % (ref 4.6–6.5)

## 2021-07-15 NOTE — Patient Instructions (Addendum)
I want you to start seeing dr Shela Nevin for your knee pain because he may be able to injection medication that will last longer than 3 months  Continue using tramadol  up to 3 times daily and tylenol  (up to 2000 mg daily in divided doses ) as needed   Your blood pressure is very high today . Please check your blood pressure a few times at home and send me the readings so I can determine if you need to start a medication to lower your blood pressure .

## 2021-07-15 NOTE — Progress Notes (Signed)
Subjective:  Patient ID: Tiffany Benjamin, female    DOB: Feb 11, 1933  Age: 87 y.o. MRN: [id number]  CC: The primary encounter diagnosis was Tricompartment osteoarthritis of both knees. Diagnoses of Hyperlipidemia, unspecified hyperlipidemia type, Chronic kidney disease, stage 3b (HCC), Elevated blood pressure reading in office with white coat syndrome, with diagnosis of hypertension, Prediabetes, Need for immunization against influenza, and Chest pain, unspecified type were also pertinent to this visit.  HPI Tiffany Benjamin presents for  FOLLOW UP ON HYPERTENSION, KNEE PAIN AND DEMENTIA  Chief Complaint  Patient presents with   Follow-up    3 month follow up   This visit occurred during the SARS-CoV-2 public health emergency.  Safety protocols were in place, including screening questions prior to the visit, additional usage of staff PPE, and extensive cleaning of exam room while observing appropriate contact time as indicated for disinfecting solutions.   \  CHRONIC KNEE PAIN :  both knees were injected at last visit in June   and she had complete relief for 3 months.  Her pain has returned and she is using tramadol for pain management and requesting bilateral I/a injections.  Reviewed  need for Orthopedics  for consideration of Synvisc.   HTN: elevated today.  reports  adherence to medication and is using a pill box .  Checks BP once a week,   home readings reportedly 140/80 at home .     Outpatient Medications Prior to Visit  Medication Sig Dispense Refill   amLODipine (NORVASC) 10 MG tablet TAKE 1 TABLET (10 MG TOTAL) BY MOUTH DAILY. IN THE MORNING FOR HYPERTENSION 90 tablet 1   aspirin 81 MG tablet Take 81 mg by mouth daily.     busPIRone (BUSPAR) 10 MG tablet TAKE 1 TABLET BY MOUTH THREE TIMES A DAY 270 tablet 1   cholecalciferol (VITAMIN D3) 25 MCG (1000 UNIT) tablet Take 1,000 Units by mouth daily.     Cyanocobalamin (VITAMIN B-12 PO) Take by mouth daily.     hydrochlorothiazide  (HYDRODIURIL) 25 MG tablet TAKE 1 TABLET (25 MG TOTAL) BY MOUTH DAILY. IN THE MORNING FOR HYPERTENSION 90 tablet 3   metoprolol succinate (TOPROL-XL) 50 MG 24 hr tablet TAKE 1 TABLET (50 MG TOTAL) BY MOUTH AT BEDTIME. FOR HYPERTENSION 90 tablet 2   Multiple Vitamins-Minerals (CENTRUM SILVER PO) Take by mouth daily.     sertraline (ZOLOFT) 25 MG tablet TAKE 1 TABLET (25 MG TOTAL) BY MOUTH DAILY. 90 tablet 1   simvastatin (ZOCOR) 20 MG tablet TAKE 1 TABLET BY MOUTH EVERY DAY IN THE EVENING 90 tablet 1   telmisartan (MICARDIS) 40 MG tablet TAKE 1 TABLET BY MOUTH EVERYDAY AT BEDTIME 90 tablet 1   traMADol (ULTRAM) 50 MG tablet TAKE 1 TABLET (50 MG TOTAL) BY MOUTH EVERY 6 (SIX) HOURS AS NEEDED. MAXIMUM 4 TABLETS DAILY 90 tablet 4   Facility-Administered Medications Prior to Visit  Medication Dose Route Frequency Provider Last Rate Last Admin   cloNIDine (CATAPRES) tablet 0.1 mg  0.1 mg Oral Once Sherlene Shams, MD        Review of Systems;  Patient denies headache, fevers, malaise, unintentional weight loss, skin rash, eye pain, sinus congestion and sinus pain, sore throat, dysphagia,  hemoptysis , cough, dyspnea, wheezing, chest pain, palpitations, orthopnea, edema, abdominal pain, nausea, melena, diarrhea, constipation, flank pain, dysuria, hematuria, urinary  Frequency, nocturia, numbness, tingling, seizures,  Focal weakness, Loss of consciousness,  Tremor, insomnia, depression, anxiety, and suicidal ideation.  Objective:  BP (!) 164/72 (BP Location: Left Arm, Patient Position: Sitting, Cuff Size: Normal)   Pulse 64   Temp (!) 96.8 F (36 C) (Temporal)   Ht 5' 1.5" (1.562 m)   Wt 171 lb 6.4 oz (77.7 kg)   SpO2 97%   BMI 31.86 kg/m   BP Readings from Last 3 Encounters:  07/15/21 (!) 164/72  03/25/21 (!) 142/78  12/19/20 (!) 152/72    Wt Readings from Last 3 Encounters:  07/15/21 171 lb 6.4 oz (77.7 kg)  06/24/21 174 lb (78.9 kg)  03/25/21 174 lb 3.2 oz (79 kg)    General  appearance: alert, cooperative and appears stated age Ears: normal TM's and external ear canals both ears Throat: lips, mucosa, and tongue normal; teeth and gums normal Neck: no adenopathy, no carotid bruit, supple, symmetrical, trachea midline and thyroid not enlarged, symmetric, no tenderness/mass/nodules Back: symmetric, no curvature. ROM normal. No CVA tenderness. Lungs: clear to auscultation bilaterally Heart: regular rate and rhythm, S1, S2 normal, no murmur, click, rub or gallop Abdomen: soft, non-tender; bowel sounds normal; no masses,  no organomegaly Pulses: 2+ and symmetric Skin: Skin color, texture, turgor normal. No rashes or lesions Lymph nodes: Cervical, supraclavicular, and axillary nodes normal.  Lab Results  Component Value Date   HGBA1C 5.7 07/15/2021   HGBA1C 5.7 02/07/2020   HGBA1C 5.9 06/26/2019    Lab Results  Component Value Date   CREATININE 1.44 (H) 07/15/2021   CREATININE 1.22 (H) 12/19/2020   CREATININE 1.52 (H) 08/11/2020    Lab Results  Component Value Date   WBC 5.7 07/15/2021   HGB 12.2 07/15/2021   HCT 37.3 07/15/2021   PLT 248.0 07/15/2021   GLUCOSE 84 07/15/2021   CHOL 180 07/15/2021   TRIG 82.0 07/15/2021   HDL 83.70 07/15/2021   LDLDIRECT 81.0 12/09/2016   LDLCALC 80 07/15/2021   ALT 12 07/15/2021   AST 19 07/15/2021   NA 142 07/15/2021   K 4.1 07/15/2021   CL 103 07/15/2021   CREATININE 1.44 (H) 07/15/2021   BUN 22 07/15/2021   CO2 30 07/15/2021   TSH 0.87 08/11/2020   HGBA1C 5.7 07/15/2021   MICROALBUR 5.2 (H) 07/15/2021    No results found.  Assessment & Plan:   Problem List Items Addressed This Visit       Unprioritized   Hyperlipidemia   Relevant Orders   Lipid panel (Completed)   Elevated blood pressure reading in office with white coat syndrome, with diagnosis of hypertension    She reports home readings have been normal and is using a pill box      Relevant Orders   Microalbumin / creatinine urine ratio  (Completed)   Tricompartment Severe Knee OA, Bilateral - Primary    Continue tylenol and tramadol for pain control.   Handicapped application for parking space initiated and shower chair DME also written. Last I/A steroid injection was in June 2022 and resulted I 3 months of relief of pain.  Referring to orthopedics       Relevant Orders   Ambulatory referral to Orthopedic Surgery   Chronic kidney disease, stage 3b (HCC)    Renal function is stable and she has no evidence of anemia.  Continue to avoid NSAIDs and other nephrotoxins  Lab Results  Component Value Date   CREATININE 1.44 (H) 07/15/2021   Lab Results  Component Value Date   NA 142 07/15/2021   K 4.1 07/15/2021   CL 103 07/15/2021  CO2 30 07/15/2021         Relevant Orders   Comprehensive metabolic panel (Completed)   CBC with Differential/Platelet (Completed)   Chest pain    Occurred with exertion in the setting of emotional stressors .she had no EKG changes..  She deferred cardiology appt despite referral made        Other Visit Diagnoses     Prediabetes       Relevant Orders   Hemoglobin A1c (Completed)   Need for immunization against influenza       Relevant Orders   Flu Vaccine QUAD High Dose(Fluad) (Completed)     [id number] Follow-up: No follow-ups on file.   Sherlene Shams, MD

## 2021-07-16 NOTE — Assessment & Plan Note (Signed)
Renal function is stable and she has no evidence of anemia.  Continue to avoid NSAIDs and other nephrotoxins  Lab Results  Component Value Date   CREATININE 1.44 (H) 07/15/2021   Lab Results  Component Value Date   NA 142 07/15/2021   K 4.1 07/15/2021   CL 103 07/15/2021   CO2 30 07/15/2021

## 2021-07-16 NOTE — Assessment & Plan Note (Signed)
She reports home readings have been normal and is using a pill box

## 2021-07-16 NOTE — Assessment & Plan Note (Signed)
Occurred with exertion in the setting of emotional stressors .she had no EKG changes..  She deferred cardiology appt despite referral made

## 2021-07-16 NOTE — Assessment & Plan Note (Signed)
Continue tylenol and tramadol for pain control.   Handicapped application for parking space initiated and shower chair DME also written. Last I/A steroid injection was in June 2022 and resulted I 3 months of relief of pain.  Referring to orthopedics

## 2021-07-27 ENCOUNTER — Telehealth: Payer: Self-pay | Admitting: Internal Medicine

## 2021-07-27 NOTE — Telephone Encounter (Signed)
"  Rejection Reason - Patient did not respond - We have contacted this patient 2 times, left 2 messages and mailed a letter. This patient has not contacted [place] back to schedule. Referral is being closed due to time sensitivity but pt has our info to call and schedule. We will still be happy to assist your office and the patient. Thank you!" Starleen Blue said on Jul 27, 2021 9:09 AM  Msg from [facility]

## 2021-07-28 DIAGNOSIS — M170 Bilateral primary osteoarthritis of knee: Secondary | ICD-10-CM | POA: Diagnosis not present

## 2021-07-28 DIAGNOSIS — M25561 Pain in right knee: Secondary | ICD-10-CM | POA: Diagnosis not present

## 2021-07-28 DIAGNOSIS — M25562 Pain in left knee: Secondary | ICD-10-CM | POA: Diagnosis not present

## 2021-08-03 ENCOUNTER — Telehealth: Payer: Self-pay | Admitting: Internal Medicine

## 2021-08-03 NOTE — Telephone Encounter (Signed)
Patient's daughter calling requesting a companion for her mother. She stating she had talked with the provider regarding her mother getting one.

## 2021-08-03 NOTE — Telephone Encounter (Signed)
Do we need to schedule an appt to discuss?

## 2021-08-07 NOTE — Telephone Encounter (Signed)
LMTCB with pt's daughter, Nicole Cella.

## 2021-08-12 NOTE — Telephone Encounter (Signed)
Pt daughter Tiffany Benjamin called in stating she is return a phone call. Pt would like callback

## 2021-08-13 NOTE — Telephone Encounter (Signed)
Pt's daughter had an appt with Dr. Darrick Huntsman yesterday and was given the information below. Daughter scheduled an appt for pt while in the office.

## 2021-08-21 ENCOUNTER — Ambulatory Visit (INDEPENDENT_AMBULATORY_CARE_PROVIDER_SITE_OTHER): Payer: Medicare HMO | Admitting: Internal Medicine

## 2021-08-21 ENCOUNTER — Encounter: Payer: Self-pay | Admitting: Internal Medicine

## 2021-08-21 ENCOUNTER — Other Ambulatory Visit: Payer: Self-pay

## 2021-08-21 VITALS — BP 150/70 | HR 65 | Temp 96.2°F | Ht 61.5 in | Wt 167.6 lb

## 2021-08-21 DIAGNOSIS — M25562 Pain in left knee: Secondary | ICD-10-CM

## 2021-08-21 DIAGNOSIS — M25561 Pain in right knee: Secondary | ICD-10-CM | POA: Diagnosis not present

## 2021-08-21 DIAGNOSIS — G3184 Mild cognitive impairment, so stated: Secondary | ICD-10-CM

## 2021-08-21 DIAGNOSIS — I10 Essential (primary) hypertension: Secondary | ICD-10-CM

## 2021-08-21 DIAGNOSIS — E785 Hyperlipidemia, unspecified: Secondary | ICD-10-CM | POA: Diagnosis not present

## 2021-08-21 DIAGNOSIS — M170 Bilateral primary osteoarthritis of knee: Secondary | ICD-10-CM | POA: Diagnosis not present

## 2021-08-21 DIAGNOSIS — G8929 Other chronic pain: Secondary | ICD-10-CM

## 2021-08-21 MED ORDER — ZOSTER VAC RECOMB ADJUVANTED 50 MCG/0.5ML IM SUSR: 0.5000 mL | Freq: Once | INTRAMUSCULAR | 1 refills | Status: AC

## 2021-08-21 NOTE — Progress Notes (Signed)
Pre visit review using our clinic review tool, if applicable. No additional management support is needed unless otherwise documented below in the visit note. 

## 2021-08-21 NOTE — Progress Notes (Signed)
Subjective:  Patient ID: Tiffany Benjamin, female    DOB: 1933/06/04  Age: 87 y.o. MRN: [id number]  CC: The primary encounter diagnosis was Chronic pain of both knees. Diagnoses of Mild cognitive impairment with memory loss, Elevated blood pressure reading in office with white coat syndrome, with diagnosis of hypertension, Hyperlipidemia, unspecified hyperlipidemia type, and Primary osteoarthritis of both knees were also pertinent to this visit.  HPI Tiffany Benjamin presents for  follow up on hypertension, knee pain and progressive memory loss .  She is accompanied by her daughter Maryruth Hancock.  Chief Complaint  Patient presents with   Follow-up    Memory loss   This visit occurred during the SARS-CoV-2 public health emergency.  Safety protocols were in place, including screening questions prior to the visit, additional usage of staff PPE, and extensive cleaning of exam room while observing appropriate contact time as indicated for disinfecting solutions.   Patient is taking her medications as prescribed and notes no adverse effects.  Home BP readings have been done about once per week and are  generally in the range of [id number] systolic / 70 to 80 diastolic.Marland Kitchen  She is avoiding added salt in her diet and walking regularly about 3 times per week for exercise  . Home BP rea"dings are done daily and are 130 to 140/70   MCI:  she continues to drive herself to church, the grocery store and other places  during the day only.  No traffic accidents or tickets.  No longer cooking for herself,  daughter supplying meals .  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  Marland Kitchen  Has been losing her  balance ,  tripped on a wrinkle in the carpet and fell down without injuring herself  .  Outpatient Medications Prior to Visit  Medication Sig Dispense Refill   amLODipine (NORVASC) 10 MG tablet TAKE 1 TABLET (10 MG TOTAL) BY MOUTH DAILY. IN THE MORNING FOR HYPERTENSION 90 tablet 1   aspirin 81 MG tablet Take 81 mg by mouth daily.     busPIRone (BUSPAR) 10 MG tablet TAKE 1 TABLET BY MOUTH THREE TIMES A DAY 270 tablet 1   cholecalciferol (VITAMIN D3) 25 MCG (1000 UNIT) tablet Take 1,000 Units by mouth daily.     Cyanocobalamin (VITAMIN B-12 PO) Take by mouth daily.     hydrochlorothiazide (HYDRODIURIL) 25 MG tablet TAKE 1 TABLET (25 MG TOTAL) BY MOUTH DAILY. IN THE MORNING FOR HYPERTENSION 90 tablet 3   metoprolol succinate (TOPROL-XL) 50 MG 24 hr tablet TAKE 1 TABLET (50 MG TOTAL) BY MOUTH AT BEDTIME. FOR HYPERTENSION 90 tablet 2   Multiple Vitamins-Minerals (CENTRUM SILVER PO) Take by mouth daily.     sertraline (ZOLOFT) 25 MG tablet TAKE 1 TABLET (25 MG TOTAL) BY MOUTH DAILY. 90 tablet 1   simvastatin (ZOCOR) 20 MG tablet TAKE 1 TABLET BY MOUTH EVERY DAY IN THE EVENING 90 tablet 1   telmisartan (MICARDIS) 40 MG tablet TAKE 1 TABLET BY MOUTH EVERYDAY AT BEDTIME 90 tablet 1   traMADol (ULTRAM) 50 MG tablet TAKE 1 TABLET (50 MG TOTAL) BY MOUTH EVERY 6 (SIX) HOURS AS NEEDED. MAXIMUM 4 TABLETS DAILY 90 tablet 4   Facility-Administered Medications Prior  to Visit  Medication Dose Route Frequency Provider Last Rate Last Admin   cloNIDine (CATAPRES) tablet 0.1 mg  0.1 mg Oral Once Sherlene Shams, MD        Review of Systems;  Patient denies headache, fevers, malaise, unintentional weight loss, skin rash, eye pain, sinus congestion and sinus pain, sore throat, dysphagia,  hemoptysis , cough, dyspnea, wheezing, chest pain, palpitations, orthopnea, edema, abdominal pain, nausea, melena, diarrhea, constipation, flank pain, dysuria, hematuria, urinary  Frequency, nocturia, numbness, tingling, seizures,  Focal weakness, Loss of consciousness,  Tremor, insomnia, depression, anxiety, and  suicidal ideation.      Objective:  BP (!) 150/70   Pulse 65   Temp (!) 96.2 F (35.7 C) (Skin)   Ht 5' 1.5" (1.562 m)   Wt 167 lb 9.6 oz (76 kg)   SpO2 98%   BMI 31.15 kg/m   BP Readings from Last 3 Encounters:  08/21/21 (!) 150/70  07/15/21 (!) 164/72  03/25/21 (!) 142/78    Wt Readings from Last 3 Encounters:  08/21/21 167 lb 9.6 oz (76 kg)  07/15/21 171 lb 6.4 oz (77.7 kg)  06/24/21 174 lb (78.9 kg)    General appearance: alert, cooperative and appears stated age Ears: normal TM's and external ear canals both ears Throat: lips, mucosa, and tongue normal; teeth and gums normal Neck: no adenopathy, no carotid bruit, supple, symmetrical, trachea midline and thyroid not enlarged, symmetric, no tenderness/mass/nodules Back: symmetric, no curvature. ROM normal. No CVA tenderness. Lungs: clear to auscultation bilaterally Heart: regular rate and rhythm, S1, S2 normal, no murmur, click, rub or gallop Abdomen: soft, non-tender; bowel sounds normal; no masses,  no organomegaly Pulses: 2+ and symmetric Skin: Skin color, texture, turgor normal. No rashes or lesions Lymph nodes: Cervical, supraclavicular, and axillary nodes normal.  Lab Results  Component Value Date   HGBA1C 5.7 07/15/2021   HGBA1C 5.7 02/07/2020   HGBA1C 5.9 06/26/2019    Lab Results  Component Value Date   CREATININE 1.44 (H) 07/15/2021   CREATININE 1.22 (H) 12/19/2020   CREATININE 1.52 (H) 08/11/2020    Lab Results  Component Value Date   WBC 5.7 07/15/2021   HGB 12.2 07/15/2021   HCT 37.3 07/15/2021   PLT 248.0 07/15/2021   GLUCOSE 84 07/15/2021   CHOL 180 07/15/2021   TRIG 82.0 07/15/2021   HDL 83.70 07/15/2021   LDLDIRECT 81.0 12/09/2016   LDLCALC 80 07/15/2021   ALT 12 07/15/2021   AST 19 07/15/2021   NA 142 07/15/2021   K 4.1 07/15/2021   CL 103 07/15/2021   CREATININE 1.44 (H) 07/15/2021   BUN 22 07/15/2021   CO2 30 07/15/2021   TSH 0.87 08/11/2020   HGBA1C 5.7 07/15/2021    MICROALBUR 5.2 (H) 07/15/2021    No results found.  Assessment & Plan:   Problem List Items Addressed This Visit     Elevated blood pressure reading in office with white coat syndrome, with diagnosis of hypertension    She is adamant that her home readings are normal for her age and that  she is taking her medications as directed.  No changes today       Hyperlipidemia    Managed with simvastatin.  she has no side effects and liver enzymes have been normal  . No changes today  Lab Results  Component Value Date   CHOL 180 07/15/2021   HDL 83.70 07/15/2021   LDLCALC 80 07/15/2021   LDLDIRECT 81.0 12/09/2016   TRIG  82.0 07/15/2021   CHOLHDL 2 07/15/2021   Lab Results  Component Value Date   ALT 12 07/15/2021   AST 19 07/15/2021   ALKPHOS 70 07/15/2021   BILITOT 0.5 07/15/2021   Lab Results  Component Value Date   LDLCALC 80 07/15/2021              Knee pain, bilateral - Primary   Relevant Orders   AMB Referral to [place]   Mild cognitive impairment with memory loss    Reviewed her relative social isolation and recent development of paranoid reactions to misplacement of things .  Recommending increased socialization.  CCM consult for social work input       Relevant Orders   AMB Referral to [place] [facility]   Osteoarthritis of knees, bilateral    Continue tylenol and tramadol for pain control.   Handicapped application for parking space initiated and shower chair DME also written. She was referred to orthopedics and recently had injections with good relief        I am having Tiffany Benjamin start on Zoster Vaccine Adjuvanted. I am also having her maintain her aspirin, Cyanocobalamin (VITAMIN B-12 PO), Multiple Vitamins-Minerals (CENTRUM SILVER PO), amLODipine, busPIRone, cholecalciferol, simvastatin, telmisartan, metoprolol succinate, traMADol, sertraline, and hydrochlorothiazide. We will continue to administer cloNIDine.  Meds  ordered this encounter  Medications   Zoster Vaccine Adjuvanted [facility]) injection    Sig: Inject 0.5 mLs into the muscle once for 1 dose.    Dispense:  1 each    Refill:  1    There are no discontinued medications.  Follow-up: No follow-ups on file.   Crecencio Mc, MD

## 2021-08-21 NOTE — Patient Instructions (Signed)
You can get your COVID Booster after the holidays  Your Shingles vaccine will be FREE after January 1

## 2021-08-22 NOTE — Assessment & Plan Note (Signed)
Managed with simvastatin.  she has no side effects and liver enzymes have been normal  . No changes today  Lab Results  Component Value Date   CHOL 180 07/15/2021   HDL 83.70 07/15/2021   LDLCALC 80 07/15/2021   LDLDIRECT 81.0 12/09/2016   TRIG 82.0 07/15/2021   CHOLHDL 2 07/15/2021   Lab Results  Component Value Date   ALT 12 07/15/2021   AST 19 07/15/2021   ALKPHOS 70 07/15/2021   BILITOT 0.5 07/15/2021   Lab Results  Component Value Date   LDLCALC 80 07/15/2021

## 2021-08-22 NOTE — Assessment & Plan Note (Signed)
Continue tylenol and tramadol for pain control.   Handicapped application for parking space initiated and shower chair DME also written. She was referred to orthopedics and recently had injections with good relief

## 2021-08-22 NOTE — Assessment & Plan Note (Signed)
Reviewed her relative social isolation and recent development of paranoid reactions to misplacement of things .  Recommending increased socialization.  CCM consult for social work input

## 2021-08-22 NOTE — Assessment & Plan Note (Signed)
She is adamant that her home readings are normal for her age and that  she is taking her medications as directed.  No changes today  

## 2021-08-24 ENCOUNTER — Telehealth: Payer: Self-pay

## 2021-08-24 NOTE — Chronic Care Management (AMB) (Signed)
  Chronic Care Management   Outreach Note  08/24/2021 Name: Tiffany Benjamin MRN: [id number] DOB: 1933-03-15  Tiffany Benjamin is a 87 y.o. year old female who is a primary care patient of Darrick Huntsman, Mar Daring, MD. I reached out to Tiffany Benjamin by phone today in response to a referral sent by Tiffany Benjamin's primary care provider.  An unsuccessful telephone outreach was attempted today. The patient was referred to the case management team for assistance with care management and care coordination.   Follow Up Plan: A HIPAA compliant phone message was left for the patient providing contact information and requesting a return call.  The care management team will reach out to the patient again over the next 5 days.  If patient returns call to provider office, please advise to call Embedded Care Management Care Guide Tiffany Benjamin  at [phone]  Tiffany Benjamin, RMA Care Guide, Embedded Care Coordination [facility]  [place], [place] [address] Direct Dial: [phone] [email or web address]: [email or web address]

## 2021-08-31 NOTE — Chronic Care Management (AMB) (Signed)
  Chronic Care Management   Note  08/31/2021 Name: Tiffany Benjamin MRN: [id number] DOB: [birth year]  Tiffany Benjamin is a 87 y.o. year old female who is a primary care patient of Derrel Nip, Aris Everts, MD. I reached out to Tiffany Benjamin by phone today in response to a referral sent by Ms. Janifer Adie Hourihan's PCP.  Ms. Mahan was given information about Chronic Care Management services today including:  CCM service includes personalized support from designated clinical staff supervised by her physician, including individualized plan of care and coordination with other care providers 24/7 contact phone numbers for assistance for urgent and routine care needs. Service will only be billed when office clinical staff spend 20 minutes or more in a month to coordinate care. Only one practitioner may furnish and bill the service in a calendar month. The patient may stop CCM services at any time (effective at the end of the month) by phone call to the office staff. The patient is responsible for co-pay (up to 20% after annual deductible is met) if co-pay is required by the individual health plan.   Patient agreed to services and verbal consent obtained.   Follow up plan: Telephone appointment with care management team member scheduled for:09/10/2021  Noreene Larsson, [place], [place],  [address] Direct Dial: [phone] [email or web address] Website: [email or web address]

## 2021-09-09 ENCOUNTER — Ambulatory Visit: Payer: Medicare HMO | Admitting: *Deleted

## 2021-09-09 DIAGNOSIS — E785 Hyperlipidemia, unspecified: Secondary | ICD-10-CM

## 2021-09-09 DIAGNOSIS — G8929 Other chronic pain: Secondary | ICD-10-CM

## 2021-09-09 DIAGNOSIS — G3184 Mild cognitive impairment, so stated: Secondary | ICD-10-CM

## 2021-09-09 DIAGNOSIS — N1832 Chronic kidney disease, stage 3b: Secondary | ICD-10-CM

## 2021-09-09 DIAGNOSIS — M6281 Muscle weakness (generalized): Secondary | ICD-10-CM

## 2021-09-09 DIAGNOSIS — E6609 Other obesity due to excess calories: Secondary | ICD-10-CM

## 2021-09-09 DIAGNOSIS — F411 Generalized anxiety disorder: Secondary | ICD-10-CM

## 2021-09-09 DIAGNOSIS — M1990 Unspecified osteoarthritis, unspecified site: Secondary | ICD-10-CM

## 2021-09-09 DIAGNOSIS — M170 Bilateral primary osteoarthritis of knee: Secondary | ICD-10-CM

## 2021-09-10 ENCOUNTER — Encounter: Payer: Self-pay | Admitting: *Deleted

## 2021-09-10 ENCOUNTER — Telehealth: Payer: Medicare HMO

## 2021-09-10 NOTE — Patient Instructions (Signed)
Visit Information   Thank you for taking time to visit with me today. Please don't hesitate to contact me if I can be of assistance to you before our next scheduled telephone appointment.  Patient Goals/Self-Care Activities:  Daughter has agreed to work with LCSW on a bi-weekly basis, in an effort to establish in-home care services, as well as apply for Adult Medicaid. Daughter has been encouraged to review the following list of resources and applications: ~ Medicaid Tips and Application ~ Personal Care Services Instructions, Application and Agency Provider List ~ [place] Instructions and Application ~ [place] and [place] ~ Adult Day Care Programs Daughter will contact LCSW directly [phone]) if she has questions, needs assistance with application completion and/or submission, or if additional social work needs are identified between now and our next scheduled telephone outreach call.  Our next appointment is by telephone on 09/23/2021 at 1:45 pm.  Please call the care guide team at [phone] if you need to cancel or reschedule your appointment.   If you are experiencing a Mental Health or [place] or need someone to talk to, please call the Suicide and Crisis Lifeline: 988 call the [place] National Suicide Prevention Lifeline: (631)706-6474 or TTY: 539 067 4427 TTY 581 345 2903) to talk to a trained counselor call 1-800-273-TALK (toll free, 24 hour hotline) go to [facility] [place] (508)[phone]) call the [facility]: [phone] call 911   Following is a copy of your full care plan:  Care Plan : LCSW Plan of Care  Updates made by Francis Gaines, LCSW since 09/10/2021 12:00 AM     Problem: Protect My Health through [facility].   Priority: High     Goal: Protect My Health through [facility].    Start Date: 09/09/2021  Expected End Date: 12/08/2021  This Visit's Progress: On track  Priority: High  Note:   Current Barriers:   Patient with Hypertension, Mild Cognitive Impairment with Memory Loss, Bilateral Osteoarthritis of Knees, Stage III Chronic Kidney Disease, Obesity and Generalized Anxiety Disorder, needs Support, Education, Resources, Referrals, Advocacy, and Care Coordination to resolve unmet personal care needs in the home. Patient is unable to self-administer medications as prescribed, or consistently perform ADL's/IADL's independently. Lacks knowledge of available community agencies and resources. Clinical Goals:  Patient's daughter will work with LCSW, in an effort to coordinate in-home care services, as well as apply for Adult Medicaid. Patient will have in-home care services in place, either through [place], with [employer], or through [employer], with the [place].    Over the next 60 days, it will be determined as to whether or not patient is eligible to receive Adult Medicaid, through the [place]. Patient will attend all scheduled medical appointments, as evidenced by daughter's report and care team review of appointment completion in electronic medical record. Patient will demonstrate improved health management independence, as evidenced by having in-home care services in place. Clinical Interventions: Patient's daughter interviewed and appropriate assessments performed. Collaboration with Primary Care Physician, Dr. Deborra Medina regarding development and update of comprehensive plan of care as evidenced by provider attestation and co-signature. Inter-disciplinary care team collaboration (see longitudinal plan of care). Interventions performed:  Problem Solving/Task-Centered Solutions Identified, Quality of Sleep Assessed and Sleep  Hygiene Techniques Promoted, Caregiver Stress Acknowledged and Consideration of In-Home  Care Services Encouraged. Mailed patient's daughter the following list of resources and applications:  ~ Medicaid Tips and Application  ~ Geophysicist/field seismologist, Optometrist List  ~ In-Home Aide Program Services Instructions and Application  ~ [place]  ~ Adult Day Care Programs Discussed plans with patient's daughter for ongoing care management follow-up and provided direct contact information for care management team. Assisted patient's daughter with obtaining information about health plan benefits through [facility]. Provided education to patient's daughter regarding level of care options. Assessed needs, level of care concerns, basic eligibility and provided education on Personal Care Services Application process, In-Home Aide Program Services Application process, and Adult Medicaid Application process. Identified resources and durable medical equipment needed in the home to improve safety and promote independence. Patient Goals/Self-Care Activities:  Daughter has agreed to work with LCSW on a bi-weekly basis, in an effort to establish in-home care services, as well as apply for Adult Medicaid. Daughter has been encouraged to review the following list of resources and applications: ~ Medicaid Tips and Application ~ Personal Care Services Instructions, Application and Agency Provider List ~ [place] Instructions and Application ~ [place] and [place] ~ Adult Day Care Programs Daughter will contact LCSW directly [phone]) if she has questions, needs assistance with application completion and/or submission, or if additional social work needs are identified between now and our next scheduled telephone outreach call.  Follow-Up:  09/23/2021 at 1:45  pm       Consent to CCM Services: Ms. Montemurro was given information about Chronic Care Management services including:  CCM service includes personalized support from designated clinical staff supervised by her physician, including individualized plan of care and coordination with other care providers 24/7 contact phone numbers for assistance for urgent and routine care needs. Service will only be billed when office clinical staff spend 20 minutes or more in a month to coordinate care. Only one practitioner may furnish and bill the service in a calendar month. The patient may stop CCM services at any time (effective at the end of the month) by phone call to the office staff. The patient will be responsible for cost sharing (co-pay) of up to 20% of the service fee (after annual deductible is met).  Patient agreed to services and verbal consent obtained.   Patient verbalizes understanding of instructions provided today and agrees to view in [employer].   Telephone follow up appointment with care management team member scheduled for:  09/23/2021 at 1:45 pm.  Nat Christen LCSW Licensed Clinical Social Worker [place]  [phone]

## 2021-09-10 NOTE — Chronic Care Management (AMB) (Signed)
Chronic Care Management    Clinical Social Work Note  09/10/2021 Name: Tiffany Benjamin MRN: [id number] DOB: May 12, 1933  Tiffany Benjamin is a 87 y.o. year old female who is a primary care patient of Tullo, Aris Everts, MD. The CCM team was consulted to assist the patient with chronic disease management and/or care coordination needs related to: [employer], Level of Care Concerns, and Caregiver Stress.   Engaged with patient's daughter by telephone for initial visit in response to provider referral for social work chronic care management and care coordination services.   Consent to Services:  The patient was given information about Chronic Care Management services, agreed to services, and gave verbal consent prior to initiation of services.  Please see initial visit note for detailed documentation.   Patient agreed to services and consent obtained.   Assessment: Review of patient past medical history, allergies, medications, and health status, including review of relevant consultants reports was performed today as part of a comprehensive evaluation and provision of chronic care management and care coordination services.     SDOH (Social Determinants of Health) assessments and interventions performed:  SDOH Interventions    Flowsheet Row Most Recent Value  SDOH Interventions   Food Insecurity Interventions Intervention Not Indicated, Other (Comment)  [Verified by Daughter - Tiffany Benjamin]  Financial Strain Interventions Intervention Not Indicated, Other (Comment)  [Verified by Daughter - Tiffany Benjamin]  Housing Interventions Intervention Not Indicated, Other (Comment)  [Verified by Daughter - Tiffany Benjamin]  Intimate Partner Violence Interventions Intervention Not Indicated, Other (Comment)  [Verified by Daughter - Tiffany Benjamin]  Physical Activity Interventions Intervention Not Indicated, Other (Comments)  [Verified by Daughter - Tiffany Benjamin]  Stress Interventions Intervention Not  Indicated, Other (Comment)  [Verified by Daughter - Tiffany Benjamin]  Social Connections Interventions Intervention Not Indicated, Other (Comment)  [Verified by Daughter - Tiffany Benjamin]  Transportation Interventions Intervention Not Indicated, Other (Comment)  [Verified by Daughter - Tiffany Benjamin]        Advanced Directives Status: See Care Plan for related entries.  CCM Care Plan  No Known Allergies  Outpatient Encounter Medications as of 09/09/2021  Medication Sig   amLODipine (NORVASC) 10 MG tablet TAKE 1 TABLET (10 MG TOTAL) BY MOUTH DAILY. IN THE MORNING FOR HYPERTENSION   aspirin 81 MG tablet Take 81 mg by mouth daily.   busPIRone (BUSPAR) 10 MG tablet TAKE 1 TABLET BY MOUTH THREE TIMES A DAY   cholecalciferol (VITAMIN D3) 25 MCG (1000 UNIT) tablet Take 1,000 Units by mouth daily.   Cyanocobalamin (VITAMIN B-12 PO) Take by mouth daily.   hydrochlorothiazide (HYDRODIURIL) 25 MG tablet TAKE 1 TABLET (25 MG TOTAL) BY MOUTH DAILY. IN THE MORNING FOR HYPERTENSION   metoprolol succinate (TOPROL-XL) 50 MG 24 hr tablet TAKE 1 TABLET (50 MG TOTAL) BY MOUTH AT BEDTIME. FOR HYPERTENSION   Multiple Vitamins-Minerals (CENTRUM SILVER PO) Take by mouth daily.   sertraline (ZOLOFT) 25 MG tablet TAKE 1 TABLET (25 MG TOTAL) BY MOUTH DAILY.   simvastatin (ZOCOR) 20 MG tablet TAKE 1 TABLET BY MOUTH EVERY DAY IN THE EVENING   telmisartan (MICARDIS) 40 MG tablet TAKE 1 TABLET BY MOUTH EVERYDAY AT BEDTIME   traMADol (ULTRAM) 50 MG tablet TAKE 1 TABLET (50 MG TOTAL) BY MOUTH EVERY 6 (SIX) HOURS AS NEEDED. MAXIMUM 4 TABLETS DAILY   Facility-Administered Encounter Medications as of 09/09/2021  Medication   cloNIDine (CATAPRES) tablet 0.1 mg    Patient Active Problem List   Diagnosis Date  Noted   Chest pain 12/20/2020   Fatigue 08/12/2020   Chronic kidney disease, stage 3b (HCC) 06/29/2020   Osteoarthritis of knees, bilateral 02/22/2020   Proximal muscle weakness 06/26/2019   Generalized anxiety  disorder 07/29/2018   Mild cognitive impairment with memory loss 06/10/2018   Encounter for preventive health examination 03/02/2016   Obesity 02/22/2015   Tricompartment Severe Knee OA, Bilateral 03/05/2014   Knee pain, bilateral 10/04/2013   Medicare annual wellness visit, subsequent 04/02/2013   Arthritis    Elevated blood pressure reading in office with white coat syndrome, with diagnosis of hypertension    Hyperlipidemia    History of colonic polyps     Conditions to be addressed/monitored: Cognitive Impairment and Memory Loss.  Limited Social Support, Level of Care Concerns, ADL/IADL Limitations, Social Isolation, Limited Access to Caregiver, Cognitive Deficits, Memory Deficits, and Lacks Knowledge of [employer].  Care Plan : LCSW Plan of Care  Updates made by Karolee Stamps, LCSW since 09/10/2021 12:00 AM     Problem: Protect My Health through [facility].   Priority: High     Goal: Protect My Health through [facility].   Start Date: 09/09/2021  Expected End Date: 12/08/2021  This Visit's Progress: On track  Priority: High  Note:   Current Barriers:   Patient with Hypertension, Mild Cognitive Impairment with Memory Loss, Bilateral Osteoarthritis of Knees, Stage III Chronic Kidney Disease, Obesity and Generalized Anxiety Disorder, needs Support, Education, Resources, Referrals, Advocacy, and Care Coordination to resolve unmet personal care needs in the home. Patient is unable to self-administer medications as prescribed, or consistently perform ADL's/IADL's independently. Lacks knowledge of available community agencies and resources. Clinical Goals:  Patient's daughter will work with LCSW, in an effort to coordinate in-home care services, as well as apply for Adult Medicaid. Patient will have in-home care services in place, either through Personal Care Services, with [employer], or through [employer], with  the [employer] of [employer] and [employer].    Over the next 60 days, it will be determined as to whether or not patient is eligible to receive Adult Medicaid, through the [facility]. Patient will attend all scheduled medical appointments, as evidenced by daughter's report and care team review of appointment completion in electronic medical record. Patient will demonstrate improved health management independence, as evidenced by having in-home care services in place. Clinical Interventions: Patient's daughter interviewed and appropriate assessments performed. Collaboration with Primary Care Physician, Dr. Duncan Dull regarding development and update of comprehensive plan of care as evidenced by provider attestation and co-signature. Inter-disciplinary care team collaboration (see longitudinal plan of care). Interventions performed:  Problem Solving/Task-Centered Solutions Identified, Quality of Sleep Assessed and Sleep Hygiene Techniques Promoted, Caregiver Stress Acknowledged and Consideration of In-Home Care Services Encouraged. Mailed patient's daughter the following list of resources and applications:  ~ Medicaid Tips and Application  ~ Financial planner, Engineer, maintenance List  ~ In-Home Aide Program Services Instructions and Application  ~ Home Health Care Agencies and In-Home Care & Respite Agencies  ~ Adult Day Care Programs Discussed plans with patient's daughter for ongoing care management follow-up and provided direct contact information for care management team. Assisted patient's daughter with obtaining information about health plan benefits through [facility]. Provided education to patient's daughter regarding level of care options. Assessed needs, level of care concerns, basic eligibility and provided education on Personal Care Services Application process, In-Home  Aide Program Services  Application process, and Adult Medicaid Application process. Identified resources and durable medical equipment needed in the home to improve safety and promote independence. Patient Goals/Self-Care Activities:  Daughter has agreed to work with LCSW on a bi-weekly basis, in an effort to establish in-home care services, as well as apply for Adult Medicaid. Daughter has been encouraged to review the following list of resources and applications: ~ Medicaid Tips and Application ~ Personal Care Services Instructions, Application and Agency Provider List ~ [place] Instructions and Application ~ [place] and [place] ~ Adult Day Care Programs Daughter will contact LCSW directly [phone]) if she has questions, needs assistance with application completion and/or submission, or if additional social work needs are identified between now and our next scheduled telephone outreach call.  Follow-Up:  09/23/2021 at 1:45 pm     Nat Christen LCSW Licensed Clinical Social Worker [place]  [phone]

## 2021-09-23 ENCOUNTER — Ambulatory Visit (INDEPENDENT_AMBULATORY_CARE_PROVIDER_SITE_OTHER): Payer: Medicare HMO | Admitting: *Deleted

## 2021-09-23 DIAGNOSIS — G3184 Mild cognitive impairment, so stated: Secondary | ICD-10-CM

## 2021-09-23 DIAGNOSIS — M1990 Unspecified osteoarthritis, unspecified site: Secondary | ICD-10-CM

## 2021-09-23 DIAGNOSIS — E785 Hyperlipidemia, unspecified: Secondary | ICD-10-CM

## 2021-09-23 DIAGNOSIS — N1832 Chronic kidney disease, stage 3b: Secondary | ICD-10-CM

## 2021-09-23 DIAGNOSIS — F411 Generalized anxiety disorder: Secondary | ICD-10-CM

## 2021-09-23 DIAGNOSIS — E6609 Other obesity due to excess calories: Secondary | ICD-10-CM

## 2021-09-23 DIAGNOSIS — M170 Bilateral primary osteoarthritis of knee: Secondary | ICD-10-CM

## 2021-09-23 DIAGNOSIS — M25561 Pain in right knee: Secondary | ICD-10-CM

## 2021-09-24 NOTE — Chronic Care Management (AMB) (Signed)
Chronic Care Management    Clinical Social Work Note  09/24/2021 Name: Tiffany Benjamin MRN: [id number] DOB: 12-26-1932  Tiffany Benjamin is a 87 y.o. year old female who is a primary care patient of Tullo, Aris Everts, MD. The CCM team was consulted to assist the patient with chronic disease management and/or care coordination needs related to: [employer], Level of Care Concerns, and Caregiver Stress.   Engaged with patient by telephone for follow up visit in response to provider referral for social work chronic care management and care coordination services.   Consent to Services:  The patient was given information about Chronic Care Management services, agreed to services, and gave verbal consent prior to initiation of services.  Please see initial visit note for detailed documentation.   Patient agreed to services and consent obtained.   Assessment: Review of patient past medical history, allergies, medications, and health status, including review of relevant consultants reports was performed today as part of a comprehensive evaluation and provision of chronic care management and care coordination services.     SDOH (Social Determinants of Health) assessments and interventions performed:    Advanced Directives Status: Not addressed in this encounter.  CCM Care Plan  No Known Allergies  Outpatient Encounter Medications as of 09/23/2021  Medication Sig   amLODipine (NORVASC) 10 MG tablet TAKE 1 TABLET (10 MG TOTAL) BY MOUTH DAILY. IN THE MORNING FOR HYPERTENSION   aspirin 81 MG tablet Take 81 mg by mouth daily.   busPIRone (BUSPAR) 10 MG tablet TAKE 1 TABLET BY MOUTH THREE TIMES A DAY   cholecalciferol (VITAMIN D3) 25 MCG (1000 UNIT) tablet Take 1,000 Units by mouth daily.   Cyanocobalamin (VITAMIN B-12 PO) Take by mouth daily.   hydrochlorothiazide (HYDRODIURIL) 25 MG tablet TAKE 1 TABLET (25 MG TOTAL) BY MOUTH DAILY. IN THE MORNING FOR HYPERTENSION   metoprolol succinate  (TOPROL-XL) 50 MG 24 hr tablet TAKE 1 TABLET (50 MG TOTAL) BY MOUTH AT BEDTIME. FOR HYPERTENSION   Multiple Vitamins-Minerals (CENTRUM SILVER PO) Take by mouth daily.   sertraline (ZOLOFT) 25 MG tablet TAKE 1 TABLET (25 MG TOTAL) BY MOUTH DAILY.   simvastatin (ZOCOR) 20 MG tablet TAKE 1 TABLET BY MOUTH EVERY DAY IN THE EVENING   telmisartan (MICARDIS) 40 MG tablet TAKE 1 TABLET BY MOUTH EVERYDAY AT BEDTIME   traMADol (ULTRAM) 50 MG tablet TAKE 1 TABLET (50 MG TOTAL) BY MOUTH EVERY 6 (SIX) HOURS AS NEEDED. MAXIMUM 4 TABLETS DAILY   Facility-Administered Encounter Medications as of 09/23/2021  Medication   cloNIDine (CATAPRES) tablet 0.1 mg    Patient Active Problem List   Diagnosis Date Noted   Chest pain 12/20/2020   Fatigue 08/12/2020   Chronic kidney disease, stage 3b ([place]) 06/29/2020   Osteoarthritis of knees, bilateral 02/22/2020   Proximal muscle weakness 06/26/2019   Generalized anxiety disorder 07/29/2018   Mild cognitive impairment with memory loss 06/10/2018   Encounter for preventive health examination 03/02/2016   Obesity 02/22/2015   Tricompartment Severe Knee OA, Bilateral 03/05/2014   Knee pain, bilateral 10/04/2013   Medicare annual wellness visit, subsequent 04/02/2013   Arthritis    Elevated blood pressure reading in office with white coat syndrome, with diagnosis of hypertension    Hyperlipidemia    History of colonic polyps     Conditions to be addressed/monitored: HTN and Anxiety.  Limited Social Support, Level of Care Concerns, ADL/IADL Limitations, Social Isolation, Limited Access to Caregiver, Cognitive Deficits, Memory Deficits, and Lacks Knowledge  of [employer].  Care Plan : LCSW Plan of Care  Updates made by Karolee Stamps, LCSW since 09/24/2021 12:00 AM     Problem: Protect My Health through [facility].   Priority: High     Goal: Protect My Health through [facility].   Start Date: 09/09/2021  Expected End Date:  12/08/2021  This Visit's Progress: On track  Recent Progress: On track  Priority: High  Note:   Current Barriers:   Patient with Hypertension, Mild Cognitive Impairment with Memory Loss, Bilateral Osteoarthritis of Knees, Stage III Chronic Kidney Disease, Obesity and Generalized Anxiety Disorder, needs Support, Education, Resources, Referrals, Advocacy, and Care Coordination to resolve unmet personal care needs in the home. Patient is unable to self-administer medications as prescribed, or consistently perform ADL's/IADL's independently. Lacks knowledge of available community agencies and resources. Clinical Goals:  Patient and patient's daughter will work with LCSW, in an effort to coordinate in-home care services, as well as apply for Adult Medicaid. Patient will have in-home care services in place, either through Personal Care Services, with [employer], or through [employer], with the [employer] of [employer] and [employer].    Over the next 60 days, it will be determined as to whether or not patient is eligible to receive Adult Medicaid, through the [facility]. Patient will attend all scheduled medical appointments, as evidenced by daughter's report and care team review of appointment completion in electronic medical record. Patient will demonstrate improved health management independence, as evidenced by having in-home care services in place. Clinical Interventions: Collaboration with Primary Care Physician, Dr. Duncan Dull regarding development and update of comprehensive plan of care as evidenced by provider attestation and co-signature. Inter-disciplinary care team collaboration (see longitudinal plan of care). Interventions performed:  Problem Solving/Task-Centered Solutions Identified, Reviewed [employer], and Consideration of [employer] Encouraged. Patient Goals/Self-Care  Activities:  You and your daughter have agreed to continue to work with LCSW on a bi-weekly basis, in an effort to establish in-home care services, as well as apply for Adult Medicaid. Re-mailed the following list of resources and applications, after you reported not receiving the first mailing: ~ Medicaid Tips and Application ~ Personal Care Services Instructions, Application and Agency Provider List ~ In-Home Aide Program Services Instructions and Application ~ Home Health Care Agencies and In-Home Care & Respite Agencies ~ Adult Day Care Programs Please review with daughter, the following list of resources and applications: ~ Medicaid Tips and Application ~ Financial planner, Engineer, maintenance List ~ In-Home Aide Program Services Instructions and Application ~ Home Health Care Agencies and In-Home Care & Respite Agencies ~ Adult Day Care Programs Daughter will contact LCSW directly (# [phone]) if she has questions, needs assistance with application completion and/or submission, or if additional social work needs are identified between now and our next scheduled telephone outreach call.  Follow-Up:  10/23/2021 at 10:45 am     Danford Bad LCSW Licensed Clinical Social Worker [facility]   [phone]

## 2021-09-24 NOTE — Patient Instructions (Signed)
Visit Information  Thank you for taking time to visit with me today. Please don't hesitate to contact me if I can be of assistance to you before our next scheduled telephone appointment.  Following are the goals we discussed today:   Patient Goals/Self-Care Activities:  You and your daughter have agreed to continue to work with LCSW on a bi-weekly basis, in an effort to establish in-home care services, as well as apply for Adult Medicaid. Re-mailed the following list of resources and applications, after you reported not receiving the first mailing: ~ Medicaid Tips and Application ~ Personal Care Services Instructions, Application and Agency Provider List ~ In-Home Aide Program Services Instructions and Application ~ Home Health Care Agencies and In-Home Care & Respite Agencies ~ Adult Day Care Programs Please review with daughter, the following list of resources and applications: ~ Medicaid Tips and Application ~ Financial planner, Engineer, maintenance List ~ In-Home Aide Program Services Instructions and Application ~ Home Health Care Agencies and In-Home Care & Respite Agencies ~ Adult Day Care Programs Daughter will contact LCSW directly (# [phone]) if she has questions, needs assistance with application completion and/or submission, or if additional social work needs are identified between now and our next scheduled telephone outreach call.   Follow-Up:  10/23/2021 at 10:45 am  Please call the care guide team at [phone] if you need to cancel or reschedule your appointment.   If you are experiencing a Mental Health or Behavioral Health Crisis or need someone to talk to, please call the Suicide and Crisis Lifeline: 988 call the [place] National Suicide Prevention Lifeline: 430-607-5424 or TTY: 534-611-7261 TTY 657-308-0018) to talk to a trained counselor call 1-800-273-TALK (toll free, 24 hour hotline) go to [facility]  Urgent Care [address], [place] [phone]) call the [facility] Crisis Line: [phone] call 911   Patient verbalizes understanding of instructions provided today and agrees to view in MyChart.   Danford Bad LCSW Licensed Clinical Social Worker [facility]  [phone]

## 2021-10-03 DIAGNOSIS — N1832 Chronic kidney disease, stage 3b: Secondary | ICD-10-CM

## 2021-10-03 DIAGNOSIS — E785 Hyperlipidemia, unspecified: Secondary | ICD-10-CM

## 2021-10-03 DIAGNOSIS — M1990 Unspecified osteoarthritis, unspecified site: Secondary | ICD-10-CM

## 2021-10-03 DIAGNOSIS — M170 Bilateral primary osteoarthritis of knee: Secondary | ICD-10-CM

## 2021-10-23 ENCOUNTER — Telehealth: Payer: Medicare HMO | Admitting: *Deleted

## 2021-10-23 ENCOUNTER — Telehealth: Payer: Self-pay | Admitting: *Deleted

## 2021-10-23 NOTE — Telephone Encounter (Signed)
°  Care Management   Follow Up Note   10/23/2021 Name: Tiffany Benjamin MRN: [id number] DOB: May 17, 1933   Referred by: Crecencio Mc, MD  Reason for referral : Chronic Care Management Needs in Patient with Hypertension, Mild Cognitive Impairment with Memory Loss, Bilateral Osteoarthritis of Knees, Stage III Chronic Kidney Disease, Obesity and Generalized Anxiety Disorder.  An unsuccessful telephone outreach was attempted today. The patient was referred to the case management team for assistance with care management and care coordination. CSW wanted to outreach to patient and patient's daughter, to discuss clinical social work follow-up plans, as well as to explain that she will be receiving a scheduled call from the newly assigned CSW.  HIPAA compliant messages left on voicemail, for both patient and daughter, as CSW awaits a return call.  Patient will be able to go into MyChart and see newly assigned CSW, [employer], in her appointment desk.  Follow-Up Plan:  Request placed with Scheduling Care Guide to reschedule patient's telephone follow-up outreach call with [facility] [place].  Nat Christen LCSW Licensed Clinical Social Worker [place]  [phone]

## 2021-10-26 ENCOUNTER — Telehealth: Payer: Self-pay

## 2021-10-26 NOTE — Chronic Care Management (AMB) (Signed)
°  Care Management   Note  10/26/2021 Name: Tiffany Benjamin MRN: [id number] DOB: [birth year]  Tiffany Benjamin is a 88 y.o. year old female who is a primary care patient of Darrick Huntsman, Mar Daring, MD and is actively engaged with the care management team. I reached out to Tiffany Benjamin by phone today to assist with re-scheduling a follow up visit with the Licensed Clinical Social Worker  Follow up plan: Unsuccessful telephone outreach attempt made. A HIPAA compliant phone message was left for the patient providing contact information and requesting a return call.  The care management team will reach out to the patient again over the next 7 days.  If patient returns call to provider office, please advise to call Embedded Care Management Care Guide Penne Lash  at [phone]  Penne Lash, RMA Care Guide, Embedded Care Coordination [facility]  [place], [place] [address] Direct Dial: [phone] [email or web address] Website: [email or web address]

## 2021-11-02 ENCOUNTER — Other Ambulatory Visit: Payer: Self-pay

## 2021-11-02 ENCOUNTER — Encounter: Payer: Self-pay | Admitting: Internal Medicine

## 2021-11-02 ENCOUNTER — Ambulatory Visit (INDEPENDENT_AMBULATORY_CARE_PROVIDER_SITE_OTHER): Payer: Medicare HMO | Admitting: Internal Medicine

## 2021-11-02 DIAGNOSIS — G3184 Mild cognitive impairment, so stated: Secondary | ICD-10-CM

## 2021-11-02 DIAGNOSIS — M170 Bilateral primary osteoarthritis of knee: Secondary | ICD-10-CM | POA: Diagnosis not present

## 2021-11-02 DIAGNOSIS — I10 Essential (primary) hypertension: Secondary | ICD-10-CM

## 2021-11-02 NOTE — Patient Instructions (Addendum)
Continue checking your blood pressure once a week and Notify me for BP readings that are persistently higher than 150/80  Please call Danford Bad, she is the licensed Child psychotherapist who has been trying to contact you to talk about how she can help you get assistance at home as well as apply for medicaid .  You can reach [place] at [phone]

## 2021-11-02 NOTE — Progress Notes (Signed)
Subjective:  Patient ID: Tiffany Benjamin, female    DOB: 1933-05-04  Age: 88 y.o. MRN: [id number]  CC: Diagnoses of Elevated blood pressure reading in office with white coat syndrome, with diagnosis of hypertension, Tricompartment osteoarthritis of both knees, and Mild cognitive impairment with memory loss were pertinent to this visit.   This visit occurred during the SARS-CoV-2 public health emergency.  Safety protocols were in place, including screening questions prior to the visit, additional usage of staff PPE, and extensive cleaning of exam room while observing appropriate contact time as indicated for disinfecting solutions.    HPI Tiffany Benjamin presents for follow up on hypertension, anxiety  aggravated by mild cognitive impairment,  DJD knee and  ckd .  She is accompanied by her son.   She feels generally well today.  She continues to have bilateral knee pain aggravated by prolonged standing and managed with tylenol, prn tramadol and periodic I/A corticosteroid injections,  done most recently in October by Orthopedic.  She continues to live independently with her adult children living in close proximity and providing supportive care.  She is wearing a Building services engineer.  She denies any history of falls.    She continues to drive during daylight hours to well known locations in town [employer], grocery store, salon) but no longer drives at night,  she does not cook anymore, but prepares breakfast and lunch for herself.  Her children provide dinner.     HTN:  Hypertension: patient checks blood pressure nce weekly at home.  Readings have been for the most part > 150/80 at rest . Patient is following a reduced salt diet most days and is taking medications as prescribed.  She is using a pill box to manage medications   Outpatient Medications Prior to Visit  Medication Sig Dispense Refill   amLODipine (NORVASC) 10 MG tablet TAKE 1 TABLET (10 MG TOTAL) BY MOUTH DAILY. IN THE MORNING FOR  HYPERTENSION 90 tablet 1   aspirin 81 MG tablet Take 81 mg by mouth daily.     busPIRone (BUSPAR) 10 MG tablet TAKE 1 TABLET BY MOUTH THREE TIMES A DAY 270 tablet 1   cholecalciferol (VITAMIN D3) 25 MCG (1000 UNIT) tablet Take 1,000 Units by mouth daily.     Cyanocobalamin (VITAMIN B-12 PO) Take by mouth daily.     hydrochlorothiazide (HYDRODIURIL) 25 MG tablet TAKE 1 TABLET (25 MG TOTAL) BY MOUTH DAILY. IN THE MORNING FOR HYPERTENSION 90 tablet 3   metoprolol succinate (TOPROL-XL) 50 MG 24 hr tablet TAKE 1 TABLET (50 MG TOTAL) BY MOUTH AT BEDTIME. FOR HYPERTENSION 90 tablet 2   Multiple Vitamins-Minerals (CENTRUM SILVER PO) Take by mouth daily.     sertraline (ZOLOFT) 25 MG tablet TAKE 1 TABLET (25 MG TOTAL) BY MOUTH DAILY. 90 tablet 1   simvastatin (ZOCOR) 20 MG tablet TAKE 1 TABLET BY MOUTH EVERY DAY IN THE EVENING 90 tablet 1   telmisartan (MICARDIS) 40 MG tablet TAKE 1 TABLET BY MOUTH EVERYDAY AT BEDTIME 90 tablet 1   traMADol (ULTRAM) 50 MG tablet TAKE 1 TABLET (50 MG TOTAL) BY MOUTH EVERY 6 (SIX) HOURS AS NEEDED. MAXIMUM 4 TABLETS DAILY 90 tablet 4   Facility-Administered Medications Prior to Visit  Medication Dose Route Frequency Provider Last Rate Last Admin   cloNIDine (CATAPRES) tablet 0.1 mg  0.1 mg Oral Once Crecencio Mc, MD        Review of Systems;  Patient denies headache, fevers, malaise, unintentional weight loss,  skin rash, eye pain, sinus congestion and sinus pain, sore throat, dysphagia,  hemoptysis , cough, dyspnea, wheezing, chest pain, palpitations, orthopnea, edema, abdominal pain, nausea, melena, diarrhea, constipation, flank pain, dysuria, hematuria, urinary  Frequency, nocturia, numbness, tingling, seizures,  Focal weakness, Loss of consciousness,  Tremor, insomnia, depression, anxiety, and suicidal ideation.      Objective:  BP (!) 146/80 (BP Location: Left Arm, Patient Position: Sitting, Cuff Size: Normal)    Pulse 65    Temp 98.1 F (36.7 C) (Oral)    Wt  169 lb 4 oz (76.8 kg)    BMI 31.46 kg/m   BP Readings from Last 3 Encounters:  11/02/21 (!) 146/80  08/21/21 (!) 150/70  07/15/21 (!) 164/72    Wt Readings from Last 3 Encounters:  11/02/21 169 lb 4 oz (76.8 kg)  08/21/21 167 lb 9.6 oz (76 kg)  07/15/21 171 lb 6.4 oz (77.7 kg)    General appearance: alert, cooperative and appears stated age Ears: normal TM's and external ear canals both ears Throat: lips, mucosa, and tongue normal; teeth and gums normal Neck: no adenopathy, no carotid bruit, supple, symmetrical, trachea midline and thyroid not enlarged, symmetric, no tenderness/mass/nodules Back: symmetric, no curvature. ROM normal. No CVA tenderness. Lungs: clear to auscultation bilaterally Heart: regular rate and rhythm, S1, S2 normal, no murmur, click, rub or gallop Abdomen: soft, non-tender; bowel sounds normal; no masses,  no organomegaly Pulses: 2+ and symmetric Skin: Skin color, texture, turgor normal. No rashes or lesions Lymph nodes: Cervical, supraclavicular, and axillary nodes normal.  Lab Results  Component Value Date   HGBA1C 5.7 07/15/2021   HGBA1C 5.7 02/07/2020   HGBA1C 5.9 06/26/2019    Lab Results  Component Value Date   CREATININE 1.44 (H) 07/15/2021   CREATININE 1.22 (H) 12/19/2020   CREATININE 1.52 (H) 08/11/2020    Lab Results  Component Value Date   WBC 5.7 07/15/2021   HGB 12.2 07/15/2021   HCT 37.3 07/15/2021   PLT 248.0 07/15/2021   GLUCOSE 84 07/15/2021   CHOL 180 07/15/2021   TRIG 82.0 07/15/2021   HDL 83.70 07/15/2021   LDLDIRECT 81.0 12/09/2016   LDLCALC 80 07/15/2021   ALT 12 07/15/2021   AST 19 07/15/2021   NA 142 07/15/2021   K 4.1 07/15/2021   CL 103 07/15/2021   CREATININE 1.44 (H) 07/15/2021   BUN 22 07/15/2021   CO2 30 07/15/2021   TSH 0.87 08/11/2020   HGBA1C 5.7 07/15/2021   MICROALBUR 5.2 (H) 07/15/2021    No results found.  Assessment & Plan:   Problem List Items Addressed This Visit     Elevated blood  pressure reading in office with white coat syndrome, with diagnosis of hypertension    She is adamant that her home readings are normal for her age and that  she is taking her medications as directed.  No changes today       Tricompartment Severe Knee OA, Bilateral    Continue tylenol and tramadol for pain control.   Handicapped application for parking space initiated and shower chair DME also writtenpreviously,  She was referred to orthopedics and recently had injections with good relief       Mild cognitive impairment with memory loss    Reviewed her relative social isolation and recent development of paranoid reactions to misplacement of things .  Recommending increased socialization.  CCM consult for social work input has been done on prior occasions  But no contact was made.  Patient's son was given the contact information for the social worker        I spent 30 minutes dedicated to the care of this patient on the date of this encounter to include pre-visit review of patient's medical history,  most recent imaging studies, Face-to-face time with the patient , and post visit ordering of testing and therapeutics.    Follow-up: Return in about 3 months (around 01/31/2022).   Crecencio Mc, MD

## 2021-11-03 ENCOUNTER — Encounter: Payer: Self-pay | Admitting: Internal Medicine

## 2021-11-03 NOTE — Assessment & Plan Note (Signed)
Reviewed her relative social isolation and recent development of paranoid reactions to misplacement of things .  Recommending increased socialization.  CCM consult for social work input has been done on prior occasions  But no contact was made.  Patient's son was given the contact information for the social worker

## 2021-11-03 NOTE — Assessment & Plan Note (Signed)
Continue tylenol and tramadol for pain control.   Handicapped application for parking space initiated and shower chair DME also writtenpreviously,  She was referred to orthopedics and recently had injections with good relief

## 2021-11-03 NOTE — Chronic Care Management (AMB) (Signed)
°  Care Management   Note  11/03/2021 Name: Tiffany Benjamin MRN: [id number] DOB: 23-Mar-1933  [name] is a 88 y.o. year old female who is a primary care patient of Derrel Nip, Aris Everts, MD and is actively engaged with the care management team. I reached out to [name] by phone today to assist with re-scheduling a follow up visit with the Licensed Clinical Social Worker  Follow up plan: Telephone appointment with care management team member scheduled for:11/04/2021  Noreene Larsson, [address], [place], [place] [address] Direct Dial: [phone] [email or web address]

## 2021-11-03 NOTE — Assessment & Plan Note (Signed)
She is adamant that her home readings are normal for her age and that  she is taking her medications as directed.  No changes today  

## 2021-11-04 ENCOUNTER — Ambulatory Visit (INDEPENDENT_AMBULATORY_CARE_PROVIDER_SITE_OTHER): Payer: Medicare HMO | Admitting: *Deleted

## 2021-11-04 DIAGNOSIS — G3184 Mild cognitive impairment, so stated: Secondary | ICD-10-CM

## 2021-11-04 DIAGNOSIS — E785 Hyperlipidemia, unspecified: Secondary | ICD-10-CM

## 2021-11-04 DIAGNOSIS — F411 Generalized anxiety disorder: Secondary | ICD-10-CM

## 2021-11-04 NOTE — Chronic Care Management (AMB) (Signed)
Chronic Care Management    Clinical Social Work Note  11/05/2021 Name: Tiffany Benjamin MRN: [id number] DOB: 1932-12-22  Tiffany Benjamin is a 88 y.o. year old female who is a primary care patient of Tullo, Aris Everts, MD. The CCM team was consulted to assist the patient with chronic disease management and/or care coordination needs related to: [employer] .   Engaged with patient by telephone for follow up visit in response to provider referral for social work chronic care management and care coordination services.   Consent to Services:  The patient was given information about Chronic Care Management services, agreed to services, and gave verbal consent prior to initiation of services.  Please see initial visit note for detailed documentation.   Patient agreed to services and consent obtained.   Assessment: Review of patient past medical history, allergies, medications, and health status, including review of relevant consultants reports was performed today as part of a comprehensive evaluation and provision of chronic care management and care coordination services.     SDOH (Social Determinants of Health) assessments and interventions performed:    Advanced Directives Status: Not addressed in this encounter.  CCM Care Plan  No Known Allergies  Outpatient Encounter Medications as of 11/04/2021  Medication Sig   amLODipine (NORVASC) 10 MG tablet TAKE 1 TABLET (10 MG TOTAL) BY MOUTH DAILY. IN THE MORNING FOR HYPERTENSION   aspirin 81 MG tablet Take 81 mg by mouth daily.   busPIRone (BUSPAR) 10 MG tablet TAKE 1 TABLET BY MOUTH THREE TIMES A DAY   cholecalciferol (VITAMIN D3) 25 MCG (1000 UNIT) tablet Take 1,000 Units by mouth daily.   Cyanocobalamin (VITAMIN B-12 PO) Take by mouth daily.   hydrochlorothiazide (HYDRODIURIL) 25 MG tablet TAKE 1 TABLET (25 MG TOTAL) BY MOUTH DAILY. IN THE MORNING FOR HYPERTENSION   metoprolol succinate (TOPROL-XL) 50 MG 24 hr tablet TAKE 1 TABLET (50  MG TOTAL) BY MOUTH AT BEDTIME. FOR HYPERTENSION   Multiple Vitamins-Minerals (CENTRUM SILVER PO) Take by mouth daily.   sertraline (ZOLOFT) 25 MG tablet TAKE 1 TABLET (25 MG TOTAL) BY MOUTH DAILY.   simvastatin (ZOCOR) 20 MG tablet TAKE 1 TABLET BY MOUTH EVERY DAY IN THE EVENING   telmisartan (MICARDIS) 40 MG tablet TAKE 1 TABLET BY MOUTH EVERYDAY AT BEDTIME   traMADol (ULTRAM) 50 MG tablet TAKE 1 TABLET (50 MG TOTAL) BY MOUTH EVERY 6 (SIX) HOURS AS NEEDED. MAXIMUM 4 TABLETS DAILY   Facility-Administered Encounter Medications as of 11/04/2021  Medication   cloNIDine (CATAPRES) tablet 0.1 mg    Patient Active Problem List   Diagnosis Date Noted   Chest pain 12/20/2020   Fatigue 08/12/2020   Chronic kidney disease, stage 3b ([place]) 06/29/2020   Osteoarthritis of knees, bilateral 02/22/2020   Proximal muscle weakness 06/26/2019   Generalized anxiety disorder 07/29/2018   Mild cognitive impairment with memory loss 06/10/2018   Encounter for preventive health examination 03/02/2016   Obesity 02/22/2015   Tricompartment Severe Knee OA, Bilateral 03/05/2014   Knee pain, bilateral 10/04/2013   Medicare annual wellness visit, subsequent 04/02/2013   Arthritis    Elevated blood pressure reading in office with white coat syndrome, with diagnosis of hypertension    Hyperlipidemia    History of colonic polyps     Conditions to be addressed/monitored:  HTN and Anxiety.  Limited Social Support, Level of Care Concerns, ADL/IADL Limitations, Social Isolation, Limited Access to Caregiver, Cognitive Deficits, Memory Deficits, and Surveyor, quantity Resources Care Plan :  LCSW Plan of Care  Updates made by Vern Claude, LCSW since 11/05/2021 12:00 AM     Problem: Protect My Health through [facility].   Priority: High     Goal: Protect My Health through [facility].   Start Date: 09/09/2021  Expected End Date: 12/08/2021  Recent Progress: On track  Priority: High   Note:   Current Barriers:   Patient with Hypertension, Mild Cognitive Impairment with Memory Loss, Bilateral Osteoarthritis of Knees, Stage III Chronic Kidney Disease, Obesity and Generalized Anxiety Disorder, needs Support, Education, Resources, Referrals, Advocacy, and Care Coordination to resolve unmet personal care needs in the home. Patient is unable to self-administer medications as prescribed, or consistently perform ADL's/IADL's independently. Lacks knowledge of available community agencies and resources. Clinical Goals:  Patient and patient's daughter will work with LCSW, in an effort to coordinate in-home care services, as well as apply for Adult Medicaid. Patient will have in-home care services in place, either through [facility], with [employer], or through [employer], with the [facility].    Over the next 60 days, it will be determined as to whether or not patient is eligible to receive Adult Medicaid, through the [place]. Patient will attend all scheduled medical appointments, as evidenced by daughter's report and care team review of appointment completion in electronic medical record. Patient will demonstrate improved health management independence, as evidenced by having in-home care services in place. Clinical Interventions: Collaboration with Primary Care Physician, Dr. Deborra Medina regarding development and update of comprehensive plan of care as evidenced by provider attestation and co-signature. Inter-disciplinary care team collaboration (see longitudinal plan of care). Interventions performed:  Problem Solving/Task-Centered Solutions Identified, Reviewed [employer], and Consideration of [employer] Encouraged. Patient Goals/Self-Care Activities:  You and your daughter have agreed to continue to work with LCSW on a bi-weekly  basis, in an effort to establish in-home care services, as well as apply for Adult Medicaid. Re-mailed the following list of resources and applications, after you reported not receiving the first mailing: ~ Medicaid Tips and Application ~ [place] Instructions, Application and Agency Provider List ~ [place] Instructions and Application ~ [place] ~ Adult Day Care Programs Please review with daughter, the following list of resources and applications: ~ Medicaid Tips and Application ~ Geophysicist/field seismologist, Optometrist List ~ [place] Instructions and Application ~ [place] and [place] ~ Adult Day Care Programs Update:  11/04/21 Phone call to patient and daughter-confirmed that they have decided against applying for Medicaid-based on eligibility requirements it is not likely that she would qualify. Confirmed that they will not be able to pay out of pocket for in home care and are interested in the possibility of a Day Program and Meals on Wheels. [facility] discussed, this social worker will complete referral and will inquire about possibility of available grant programs for payments. Meals on Wheel referral to also be made. 11/05/21 Update -[facility]-Spoke with Arnell Asal, confirmed that the [facility] has a long waiting list, however there are availabilities in their [facility] waiting list confirmed to be much shorter) Coordinator suggested a tour before starting the enrollment process-contact number provided to patient's daughter [phone] to schedule tour [facility] [place] contacted -  voicemail left for a return call to complete referral for Meals on Wheels.         Follow Up Plan: SW will follow up with patient by phone over the next 14 business  days.      [address] [place] [phone]

## 2021-11-04 NOTE — Patient Instructions (Addendum)
Visit Information  Thank you for taking time to visit with me today. Please don't hesitate to contact me if I can be of assistance to you before our next scheduled telephone appointment.  Following are the goals we discussed today:  (You and your daughter have agreed to continue to work with LCSW on a bi-weekly basis, in an effort to follow up with referrals for local Day Programs and Meals on Wheels.  Plan to follow up with Medicaid application declined due to eligibility requirements. Please contact the [facility] to schedule a tour for possible weekly attendance [phone] Daughter will contact LCSW directly (# [phone]) if she has questions, needs assistance with application completion and/or submission, or if additional social work needs are identified between now and our next scheduled telephone outreach call.  Our next appointment is by telephone on 11/18/21 at  Please call the care guide team at [phone] if you need to cancel or reschedule your appointment.   If you are experiencing a Mental Health or Behavioral Health Crisis or need someone to talk to, please call the Suicide and Crisis Lifeline: 988   Patient verbalizes understanding of instructions and care plan provided today and agrees to view in MyChart. Active MyChart status confirmed with patient.    Telephone follow up appointment with care management team member scheduled for: 11/18/21  Verna Czech, LCSW [employer] [phone]

## 2021-11-09 ENCOUNTER — Telehealth: Payer: Self-pay

## 2021-11-09 MED ORDER — HYDROCHLOROTHIAZIDE 25 MG PO TABS: 25.0000 mg | ORAL_TABLET | Freq: Every day | ORAL | 1 refills | Status: DC

## 2021-11-09 MED ORDER — METOPROLOL SUCCINATE ER 50 MG PO TB24: 50.0000 mg | ORAL_TABLET | Freq: Every day | ORAL | 1 refills | Status: DC

## 2021-11-09 MED ORDER — AMLODIPINE BESYLATE 10 MG PO TABS: 10.0000 mg | ORAL_TABLET | Freq: Every day | ORAL | 1 refills | Status: DC

## 2021-11-09 MED ORDER — SIMVASTATIN 20 MG PO TABS: 20.0000 mg | ORAL_TABLET | Freq: Every day | ORAL | 1 refills | Status: DC

## 2021-11-09 MED ORDER — BUSPIRONE HCL 10 MG PO TABS: 10.0000 mg | ORAL_TABLET | Freq: Three times a day (TID) | ORAL | 1 refills | Status: DC

## 2021-11-09 MED ORDER — SERTRALINE HCL 25 MG PO TABS: 25.0000 mg | ORAL_TABLET | Freq: Every day | ORAL | 1 refills | Status: DC

## 2021-11-09 MED ORDER — TELMISARTAN 40 MG PO TABS: ORAL_TABLET | ORAL | 1 refills | Status: DC

## 2021-11-09 NOTE — Telephone Encounter (Signed)
All medication have bee refilled.

## 2021-11-09 NOTE — Telephone Encounter (Signed)
Pt called in requesting for refill on medication (aspirin 81 MG tablet), (busPIRone (BUSPAR) 10 MG tablet), (cholecalciferol (VITAMIN D3) 25 MCG (1000 UNIT) tablet),(Cyanocobalamin (VITAMIN B-12 PO), (metoprolol succinate (TOPROL-XL) 50 MG 24 hr tablet),(Multiple Vitamins-Minerals (CENTRUM SILVER PO),(sertraline (ZOLOFT) 25 MG tablet), (telmisartan (MICARDIS) 40 MG tablet), and (traMADol (ULTRAM) 50 MG tablet). Pt requesting callback

## 2021-11-18 ENCOUNTER — Ambulatory Visit: Payer: Medicare HMO | Admitting: *Deleted

## 2021-11-18 DIAGNOSIS — G3184 Mild cognitive impairment, so stated: Secondary | ICD-10-CM

## 2021-11-18 DIAGNOSIS — F411 Generalized anxiety disorder: Secondary | ICD-10-CM

## 2021-11-18 DIAGNOSIS — I10 Essential (primary) hypertension: Secondary | ICD-10-CM

## 2021-11-18 NOTE — Patient Instructions (Signed)
Visit Information  Thank you for taking time to visit with me today. Please don't hesitate to contact me if I can be of assistance to you before our next scheduled telephone appointment.  Following are the goals we discussed today:  You and your daughter have agreed to continue to work with LCSW on a bi-weekly basis, in an effort to follow up with referrals for local Day Programs and Meals on Wheels.  Plan to follow up with Medicaid application declined due to eligibility requirements. Daughter will contact LCSW directly (# [phone] she has questions, needs assistance with application completion and/or submission, or if additional social work needs are identified between now and our next scheduled telephone outreach call.   Our next appointment is by telephone on 12/02/21 at 10:45  Please call the care guide team at [phone] if you need to cancel or reschedule your appointment.   If you are experiencing a Mental Health or Behavioral Health Crisis or need someone to talk to, please call the Suicide and Crisis Lifeline: 988   Patient verbalizes understanding of instructions and care plan provided today and agrees to view in MyChart. Active MyChart status confirmed with patient.    Telephone follow up appointment with care management team member scheduled for: 12/02/21  [employer], LCSW [employer] [phone]

## 2021-11-18 NOTE — Chronic Care Management (AMB) (Signed)
Chronic Care Management    Clinical Social Work Note  11/18/2021 Name: Tiffany Benjamin MRN: [id number] DOB: [birth year]  Tiffany Benjamin is a 88 y.o. year old female who is a primary care patient of Tullo, Aris Everts, MD. The CCM team was consulted to assist the patient with chronic disease management and/or care coordination needs related to: [employer] .   Collaboration with patient's daughter  for follow up visit in response to provider referral for social work chronic care management and care coordination services.   Consent to Services:  The patient was given information about Chronic Care Management services, agreed to services, and gave verbal consent prior to initiation of services.  Please see initial visit note for detailed documentation.   Patient agreed to services and consent obtained.   Assessment: Review of patient past medical history, allergies, medications, and health status, including review of relevant consultants reports was performed today as part of a comprehensive evaluation and provision of chronic care management and care coordination services.     SDOH (Social Determinants of Health) assessments and interventions performed:    Advanced Directives Status: Not addressed in this encounter.  CCM Care Plan  No Known Allergies  Outpatient Encounter Medications as of 11/18/2021  Medication Sig   amLODipine (NORVASC) 10 MG tablet Take 1 tablet (10 mg total) by mouth daily. IN THE MORNING FOR HYPERTENSION   aspirin 81 MG tablet Take 81 mg by mouth daily.   busPIRone (BUSPAR) 10 MG tablet Take 1 tablet (10 mg total) by mouth 3 (three) times daily.   cholecalciferol (VITAMIN D3) 25 MCG (1000 UNIT) tablet Take 1,000 Units by mouth daily.   Cyanocobalamin (VITAMIN B-12 PO) Take by mouth daily.   hydrochlorothiazide (HYDRODIURIL) 25 MG tablet Take 1 tablet (25 mg total) by mouth daily. In the morning for hypertension   metoprolol succinate (TOPROL-XL) 50 MG 24 hr  tablet Take 1 tablet (50 mg total) by mouth at bedtime. FOR HYPERTENSION   Multiple Vitamins-Minerals (CENTRUM SILVER PO) Take by mouth daily.   sertraline (ZOLOFT) 25 MG tablet Take 1 tablet (25 mg total) by mouth daily.   simvastatin (ZOCOR) 20 MG tablet Take 1 tablet (20 mg total) by mouth daily.   telmisartan (MICARDIS) 40 MG tablet TAKE 1 TABLET BY MOUTH EVERYDAY AT BEDTIME   traMADol (ULTRAM) 50 MG tablet TAKE 1 TABLET (50 MG TOTAL) BY MOUTH EVERY 6 (SIX) HOURS AS NEEDED. MAXIMUM 4 TABLETS DAILY   Facility-Administered Encounter Medications as of 11/18/2021  Medication   cloNIDine (CATAPRES) tablet 0.1 mg    Patient Active Problem List   Diagnosis Date Noted   Chest pain 12/20/2020   Fatigue 08/12/2020   Chronic kidney disease, stage 3b ([place]) 06/29/2020   Osteoarthritis of knees, bilateral 02/22/2020   Proximal muscle weakness 06/26/2019   Generalized anxiety disorder 07/29/2018   Mild cognitive impairment with memory loss 06/10/2018   Encounter for preventive health examination 03/02/2016   Obesity 02/22/2015   Tricompartment Severe Knee OA, Bilateral 03/05/2014   Knee pain, bilateral 10/04/2013   Medicare annual wellness visit, subsequent 04/02/2013   Arthritis    Elevated blood pressure reading in office with white coat syndrome, with diagnosis of hypertension    Hyperlipidemia    History of colonic polyps     Conditions to be addressed/monitored:  HTN and Anxiety.  Limited Social Support, Level of Care Concerns, ADL/IADL Limitations, Social Isolation, Limited Access to Caregiver, Cognitive Deficits, Memory Deficits, and Lacks Knowledge of [employer]  Care Plan : LCSW Plan of Care  Updates made by Tiffany Claude, LCSW since 11/18/2021 12:00 AM     Problem: Protect My Health through Tiffany Benjamin.   Priority: High     Goal: Protect My Health through Tiffany Benjamin.   Start Date: 09/09/2021  Expected End Date: 12/08/2021  Recent Progress: On  track  Priority: High  Note:   Current Barriers:   Patient with Hypertension, Mild Cognitive Impairment with Memory Loss, Bilateral Osteoarthritis of Knees, Stage III Chronic Kidney Disease, Obesity and Generalized Anxiety Disorder, needs Support, Education, Resources, Referrals, Advocacy, and Care Coordination to resolve unmet personal care needs in the home. Patient is unable to self-administer medications as prescribed, or consistently perform ADL's/IADL's independently. Lacks knowledge of available community agencies and resources. Clinical Goals:  Patient and patient's daughter will work with LCSW, in an effort to coordinate in-home care services, as well as apply for Adult Medicaid. Patient will have in-home care services in place, either through [place], with [employer], or through [employer], with the [place].    Over the next 60 days, it will be determined as to whether or not patient is eligible to receive Adult Medicaid, through the [place]. Patient will attend all scheduled medical appointments, as evidenced by daughter's report and care team review of appointment completion in electronic medical record. Patient will demonstrate improved health management independence, as evidenced by having in-home care services in place. Clinical Interventions: Collaboration with Primary Care Physician, Dr. Deborra Medina regarding development and update of comprehensive plan of care as evidenced by provider attestation and co-signature. Inter-disciplinary care team collaboration (see longitudinal plan of care). Interventions performed:  Problem Solving/Task-Centered Solutions Identified, Reviewed [employer], and Consideration of [employer] Encouraged. Patient Goals/Self-Care Activities:  You and your daughter have agreed to continue to work with  LCSW on a bi-weekly basis, in an effort to establish in-home care services, as well as apply for Adult Medicaid. Re-mailed the following list of resources and applications, after you reported not receiving the first mailing: ~ Medicaid Tips and Application ~ [place] Instructions, Application and Agency Provider List ~ [place] Instructions and Application ~ [place] ~ Adult Day Care Programs Please review with daughter, the following list of resources and applications: ~ Medicaid Tips and Application ~ Geophysicist/field seismologist, Optometrist List ~ [place] Instructions and Application ~ [place] and [place] ~ Adult Day Care Programs Update:  11/04/21 Phone call to patient and daughter-confirmed that they have decided against applying for Medicaid-based on eligibility requirements it is not likely that she would qualify. Confirmed that they will not be able to pay out of pocket for in home care and are interested in the possibility of a Day Program and Meals on Wheels. [facility] discussed, this social worker will complete referral and will inquire about possibility of available grant programs for payments. Meals on Wheel referral to also be made. 11/05/21 Update -[facility]-Spoke with Arnell Asal, confirmed that the [facility] has a long waiting list, however there are availabilities in their [facility] waiting list confirmed to be much shorter) Coordinator suggested a tour before starting the enrollment process-contact number provided to patient's daughter [phone] to schedule tour [facility] [place] contacted -voicemail left for a return call to complete referral for Meals on Wheels. 11/18/21 Update Per patient's daughter, patient went to visit the Day Program at  [place] and is very interested in attending. Patient will plan to attend 2x per week, per coordinator grant is available. Patient's provider will need to completed and return a medical examination form before patient can attend. Request sent to patient's provider Follow up phone call to [facility] to follow up on Meals on Wheels request. VM left requesting a return call.         Follow Up Plan: SW will follow up with patient by phone over the next 14 business days      [employer], [place] [phone]

## 2021-11-20 DIAGNOSIS — Z0279 Encounter for issue of other medical certificate: Secondary | ICD-10-CM

## 2021-11-30 ENCOUNTER — Telehealth: Payer: Self-pay | Admitting: Internal Medicine

## 2021-12-01 DIAGNOSIS — I10 Essential (primary) hypertension: Secondary | ICD-10-CM

## 2021-12-01 DIAGNOSIS — E785 Hyperlipidemia, unspecified: Secondary | ICD-10-CM

## 2021-12-02 ENCOUNTER — Ambulatory Visit (INDEPENDENT_AMBULATORY_CARE_PROVIDER_SITE_OTHER): Payer: Medicare HMO | Admitting: *Deleted

## 2021-12-02 DIAGNOSIS — F411 Generalized anxiety disorder: Secondary | ICD-10-CM

## 2021-12-02 DIAGNOSIS — I10 Essential (primary) hypertension: Secondary | ICD-10-CM

## 2021-12-02 DIAGNOSIS — G3184 Mild cognitive impairment, so stated: Secondary | ICD-10-CM

## 2021-12-02 NOTE — Patient Instructions (Signed)
Visit Information ? ?Thank you for taking time to visit with me today. Please don't hesitate to contact me if I can be of assistance to you before our next scheduled telephone appointment. ? ?Following are the goals we discussed today:  ?You and your daughter have agreed to continue to work with LCSW on a bi-weekly basis, in an effort to follow up with referrals for local Day Programs and Meals on Wheels.  Plan to follow up with Medicaid application declined due to eligibility requirements. ?Daughter will contact LCSW directly (# [phone] she has questions, needs assistance with application completion and/or submission, or if additional social work needs are identified between now and our next scheduled telephone outreach call.  ? ?Our next appointment is by telephone on 12/16/21 at 10am ? ?Please call the care guide team at [phone] if you need to cancel or reschedule your appointment.  ? ?If you are experiencing a Mental Health or Behavioral Health Crisis or need someone to talk to, please call the Suicide and Crisis Lifeline: 988  ? ?Patient verbalizes understanding of instructions and care plan provided today and agrees to view in MyChart. Active MyChart status confirmed with patient.   ? ?Telephone follow up appointment with care management team member scheduled for: 12/16/21 ? ?Ailish Prospero, LCSW ?[facility] [employer] ?[phone] ? ?

## 2021-12-02 NOTE — Chronic Care Management (AMB) (Signed)
Chronic Care Management    Clinical Social Work Note  12/02/2021 Name: Tiffany Benjamin MRN: [id number] DOB: 1933/06/29  Tiffany Benjamin is a 88 y.o. year old female who is a primary care patient of Tullo, Aris Everts, MD. The CCM team was consulted to assist the patient with chronic disease management and/or care coordination needs related to: [employer] .   Collaboration with patient and patient's daughter  for follow up visit in response to provider referral for social work chronic care management and care coordination services.   Consent to Services:  The patient was given information about Chronic Care Management services, agreed to services, and gave verbal consent prior to initiation of services.  Please see initial visit note for detailed documentation.   Patient agreed to services and consent obtained.   Assessment: Review of patient past medical history, allergies, medications, and health status, including review of relevant consultants reports was performed today as part of a comprehensive evaluation and provision of chronic care management and care coordination services.     SDOH (Social Determinants of Health) assessments and interventions performed:    Advanced Directives Status: Not addressed in this encounter.  CCM Care Plan  No Known Allergies  Outpatient Encounter Medications as of 12/02/2021  Medication Sig   amLODipine (NORVASC) 10 MG tablet Take 1 tablet (10 mg total) by mouth daily. IN THE MORNING FOR HYPERTENSION   aspirin 81 MG tablet Take 81 mg by mouth daily.   busPIRone (BUSPAR) 10 MG tablet Take 1 tablet (10 mg total) by mouth 3 (three) times daily.   cholecalciferol (VITAMIN D3) 25 MCG (1000 UNIT) tablet Take 1,000 Units by mouth daily.   Cyanocobalamin (VITAMIN B-12 PO) Take by mouth daily.   hydrochlorothiazide (HYDRODIURIL) 25 MG tablet Take 1 tablet (25 mg total) by mouth daily. In the morning for hypertension   metoprolol succinate (TOPROL-XL) 50  MG 24 hr tablet Take 1 tablet (50 mg total) by mouth at bedtime. FOR HYPERTENSION   Multiple Vitamins-Minerals (CENTRUM SILVER PO) Take by mouth daily.   sertraline (ZOLOFT) 25 MG tablet Take 1 tablet (25 mg total) by mouth daily.   simvastatin (ZOCOR) 20 MG tablet Take 1 tablet (20 mg total) by mouth daily.   telmisartan (MICARDIS) 40 MG tablet TAKE 1 TABLET BY MOUTH EVERYDAY AT BEDTIME   traMADol (ULTRAM) 50 MG tablet TAKE 1 TABLET (50 MG TOTAL) BY MOUTH EVERY 6 (SIX) HOURS AS NEEDED. MAXIMUM 4 TABLETS DAILY   Facility-Administered Encounter Medications as of 12/02/2021  Medication   cloNIDine (CATAPRES) tablet 0.1 mg    Patient Active Problem List   Diagnosis Date Noted   Chest pain 12/20/2020   Fatigue 08/12/2020   Chronic kidney disease, stage 3b ([place]) 06/29/2020   Osteoarthritis of knees, bilateral 02/22/2020   Proximal muscle weakness 06/26/2019   Generalized anxiety disorder 07/29/2018   Mild cognitive impairment with memory loss 06/10/2018   Encounter for preventive health examination 03/02/2016   Obesity 02/22/2015   Tricompartment Severe Knee OA, Bilateral 03/05/2014   Knee pain, bilateral 10/04/2013   Medicare annual wellness visit, subsequent 04/02/2013   Arthritis    Elevated blood pressure reading in office with white coat syndrome, with diagnosis of hypertension    Hyperlipidemia    History of colonic polyps     Conditions to be addressed/monitored:  HTN and Anxiety.  Limited Social Support, Level of Care Concerns, ADL/IADL Limitations, Social Isolation, Limited Access to Caregiver, Cognitive Deficits, Memory Deficits, and Lacks Knowledge of  Community Resources Care Plan : LCSW Plan of Care  Updates made by Vern Claude, LCSW since 12/02/2021 12:00 AM     Problem: Protect My Health through [facility].   Priority: High     Goal: Protect My Health through [facility].   Start Date: 09/09/2021  Expected End Date: 12/08/2021  This Visit's  Progress: On track  Recent Progress: On track  Priority: High  Note:   Current Barriers:   Patient with Hypertension, Mild Cognitive Impairment with Memory Loss, Bilateral Osteoarthritis of Knees, Stage III Chronic Kidney Disease, Obesity and Generalized Anxiety Disorder, needs Support, Education, Resources, Referrals, Advocacy, and Care Coordination to resolve unmet personal care needs in the home. Patient is unable to self-administer medications as prescribed, or consistently perform ADL's/IADL's independently. Lacks knowledge of available community agencies and resources. Clinical Goals:  Patient and patient's daughter will work with LCSW, in an effort to coordinate in-home care services, as well as apply for Adult Medicaid. Patient will have in-home care services in place, either through [place], with [employer], or through [employer], with the [place].    Over the next 60 days, it will be determined as to whether or not patient is eligible to receive Adult Medicaid, through the [place]. Patient will attend all scheduled medical appointments, as evidenced by daughter's report and care team review of appointment completion in electronic medical record. Patient will demonstrate improved health management independence, as evidenced by having in-home care services in place. Clinical Interventions: Collaboration with Primary Care Physician, Dr. Deborra Medina regarding development and update of comprehensive plan of care as evidenced by provider attestation and co-signature. Inter-disciplinary care team collaboration (see longitudinal plan of care). Interventions performed:  Problem Solving/Task-Centered Solutions Identified, Reviewed [employer], and Consideration of [employer] Encouraged. Patient Goals/Self-Care Activities:  You and your  daughter have agreed to continue to work with LCSW on a bi-weekly basis, in an effort to establish in-home care services, as well as apply for Adult Medicaid. Re-mailed the following list of resources and applications, after you reported not receiving the first mailing: ~ Medicaid Tips and Application ~ [place] Instructions, Application and Agency Provider List ~ [place] Instructions and Application ~ [place] ~ Adult Day Care Programs Please review with daughter, the following list of resources and applications: ~ Medicaid Tips and Application ~ Geophysicist/field seismologist, Optometrist List ~ [place] Instructions and Application ~ [place] ~ Adult Day Care Programs Update:  11/04/21 Phone call to patient and daughter-confirmed that they have decided against applying for Medicaid-based on eligibility requirements it is not likely that she would qualify. Confirmed that they will not be able to pay out of pocket for in home care and are interested in the possibility of a Day Program and Meals on Wheels. [facility] discussed, this social worker will complete referral and will inquire about possibility of available grant programs for payments. Meals on Wheel referral to also be made. 11/05/21 Update -[facility]-Spoke with Arnell Asal, confirmed that the [facility] has a long waiting list, however there are availabilities in their [facility] waiting list confirmed to be much shorter) Coordinator suggested a tour before starting the enrollment process-contact number provided to  patient's daughter [phone] to schedule tour [facility] [place] contacted -voicemail left for a return call to complete referral for Meals on Wheels. 11/18/21 Update Per patient's  daughter, patient went to visit the Day Program at [place] and is very interested in attending. Patient will plan to attend 2x per week, per coordinator grant is available. Patient's provider will need to completed and return a medical examination form before patient can attend. Request sent to patient's provider Follow up phone call to [facility] to follow up on Meals on Wheels request. VM left requesting a return call. 12/02/21 Patient and daughter following up on referral to [place]. Adult Day Care Medical Exam faxed to provider to complete and return to the [place] contacted to follow up referral by email and VM-will await follow up call.         Follow Up Plan: SW will follow up with patient by phone over the next 14 business days      [employer], [place] [phone]

## 2021-12-16 ENCOUNTER — Ambulatory Visit: Payer: Medicare HMO | Admitting: *Deleted

## 2021-12-16 DIAGNOSIS — G3184 Mild cognitive impairment, so stated: Secondary | ICD-10-CM

## 2021-12-16 DIAGNOSIS — F411 Generalized anxiety disorder: Secondary | ICD-10-CM

## 2021-12-16 DIAGNOSIS — I10 Essential (primary) hypertension: Secondary | ICD-10-CM

## 2021-12-16 NOTE — Chronic Care Management (AMB) (Signed)
?Chronic Care Management  ? ? Clinical Social Work Note ? ?12/16/2021 ?Name: Tiffany Benjamin MRN: [id number] DOB: 1933/02/17 ? ?Tiffany Benjamin is a 88 y.o. year old female who is a primary care patient of Tullo, Aris Everts, MD. The CCM team was consulted to assist the patient with chronic disease management and/or care coordination needs related to: [employer] .  ? ?Collaboration with patient's daughter  for follow up visit in response to provider referral for social work chronic care management and care coordination services.  ? ?Consent to Services:  ?The patient was given information about Chronic Care Management services, agreed to services, and gave verbal consent prior to initiation of services.  Please see initial visit note for detailed documentation.  ? ?Patient agreed to services and consent obtained.  ? ?Assessment: Review of patient past medical history, allergies, medications, and health status, including review of relevant consultants reports was performed today as part of a comprehensive evaluation and provision of chronic care management and care coordination services.    ? ?SDOH (Social Determinants of Health) assessments and interventions performed:   ? ?Advanced Directives Status: Not addressed in this encounter. ? ?CCM Care Plan ? ?No Known Allergies ? ?Outpatient Encounter Medications as of 12/16/2021  ?Medication Sig  ? amLODipine (NORVASC) 10 MG tablet Take 1 tablet (10 mg total) by mouth daily. IN THE MORNING FOR HYPERTENSION  ? aspirin 81 MG tablet Take 81 mg by mouth daily.  ? busPIRone (BUSPAR) 10 MG tablet Take 1 tablet (10 mg total) by mouth 3 (three) times daily.  ? cholecalciferol (VITAMIN D3) 25 MCG (1000 UNIT) tablet Take 1,000 Units by mouth daily.  ? Cyanocobalamin (VITAMIN B-12 PO) Take by mouth daily.  ? hydrochlorothiazide (HYDRODIURIL) 25 MG tablet Take 1 tablet (25 mg total) by mouth daily. In the morning for hypertension  ? metoprolol succinate (TOPROL-XL) 50 MG 24 hr  tablet Take 1 tablet (50 mg total) by mouth at bedtime. FOR HYPERTENSION  ? Multiple Vitamins-Minerals (CENTRUM SILVER PO) Take by mouth daily.  ? sertraline (ZOLOFT) 25 MG tablet Take 1 tablet (25 mg total) by mouth daily.  ? simvastatin (ZOCOR) 20 MG tablet Take 1 tablet (20 mg total) by mouth daily.  ? telmisartan (MICARDIS) 40 MG tablet TAKE 1 TABLET BY MOUTH EVERYDAY AT BEDTIME  ? traMADol (ULTRAM) 50 MG tablet TAKE 1 TABLET (50 MG TOTAL) BY MOUTH EVERY 6 (SIX) HOURS AS NEEDED. MAXIMUM 4 TABLETS DAILY  ? ?Facility-Administered Encounter Medications as of 12/16/2021  ?Medication  ? cloNIDine (CATAPRES) tablet 0.1 mg  ? ? ?Patient Active Problem List  ? Diagnosis Date Noted  ? Chest pain 12/20/2020  ? Fatigue 08/12/2020  ? Chronic kidney disease, stage 3b ([place]) 06/29/2020  ? Osteoarthritis of knees, bilateral 02/22/2020  ? Proximal muscle weakness 06/26/2019  ? Generalized anxiety disorder 07/29/2018  ? Mild cognitive impairment with memory loss 06/10/2018  ? Encounter for preventive health examination 03/02/2016  ? Obesity 02/22/2015  ? Tricompartment Severe Knee OA, Bilateral 03/05/2014  ? Knee pain, bilateral 10/04/2013  ? Medicare annual wellness visit, subsequent 04/02/2013  ? Arthritis   ? Elevated blood pressure reading in office with white coat syndrome, with diagnosis of hypertension   ? Hyperlipidemia   ? History of colonic polyps   ? ? ?Conditions to be addressed/monitored: ;  ?HTN and Anxiety.  Limited Social Support, Level of Care Concerns, ADL/IADL Limitations, Social Isolation, Limited Access to Caregiver, Cognitive Deficits, Memory Deficits, and Lacks Knowledge of [employer]  Resources ?Care Plan : LCSW Plan of Care  ?Updates made by Vern Claude, LCSW since 12/16/2021 12:00 AM  ?  ? ?Problem: Protect My Health through [facility].   ?Priority: High  ?  ? ?Goal: Protect My Health through [facility].   ?Start Date: 09/09/2021  ?Expected End Date: 12/08/2021  ?This Visit's  Progress: On track  ?Recent Progress: On track  ?Priority: High  ?Note:   ?Current Barriers:   ?Patient with Hypertension, Mild Cognitive Impairment with Memory Loss, Bilateral Osteoarthritis of Knees, Stage III Chronic Kidney Disease, Obesity and Generalized Anxiety Disorder, needs Support, Education, Resources, Referrals, Advocacy, and Care Coordination to resolve unmet personal care needs in the home. ?Patient is unable to self-administer medications as prescribed, or consistently perform ADL's/IADL's independently. ?Lacks knowledge of available community agencies and resources. ?Clinical Goals:  ?Patient and patient's daughter will work with LCSW, in an effort to coordinate in-home care services, as well as apply for Adult Medicaid. ?Patient will  continue active participation in the [facility] 2x per week ?Patient will attend all scheduled medical appointments, as evidenced by daughter's report and care team review of appointment completion in electronic medical record. ?Patient will demonstrate improved health management independence, as evidenced by active participation in the [place] [facility]. ?Clinical Interventions: ? ?Patient's daughter confirmed that patient started the Adult Day Program at [facility]-patient confirmed start date 12/10/21. Transportation coordinated by [employer]   ?Patient's daughter confirmed that patient actively participates and is agreeable to continuing ?Follow up phone call to [facility] [employer] [facility] [place] to follow up on Meals on Wheels referral-referral made again ?Collaboration with Primary Care Physician, Dr. Deborra Medina regarding development and update of comprehensive plan of care as evidenced by provider attestation and co-signature. ?Inter-disciplinary care team collaboration (see longitudinal plan of care). ?Interventions performed:  Problem Solving/Task-Centered Solutions Identified, Reviewed [employer], and  Consideration of [employer] Encouraged. ?Patient Goals/Self-Care Activities:  ?You and your daughter have agreed to continue to work with LCSW on a bi-weekly basis, in an effort to establish enrollment at the [employer] Adult Day Program ?  ?  ? ?Follow Up Plan: SW will follow up with patient by phone over the next 14 business days ?     ?Kodie Kishi, LCSW ?[place] ?[phone] ? ? ? ?

## 2021-12-16 NOTE — Patient Instructions (Signed)
Visit Information ? ?Thank you for taking time to visit with me today. Please don't hesitate to contact me if I can be of assistance to you before our next scheduled telephone appointment. ? ?Following are the goals we discussed today:  ?You and your daughter have agreed to continue to work with LCSW on a bi-weekly basis, in an effort to follow up with referrals for local Day Programs and Meals on Wheels.  Plan to follow up with Medicaid application declined due to eligibility requirements. ?Daughter will contact LCSW directly (# [phone] she has questions, needs assistance with application completion and/or submission, or if additional social work needs are identified between now and our next scheduled telephone outreach call.  ? ? ?Our next appointment is by telephone on 12/30/21 at 10am ? ?Please call the care guide team at [phone] if you need to cancel or reschedule your appointment.  ? ?If you are experiencing a Mental Health or Behavioral Health Crisis or need someone to talk to, please call the Suicide and Crisis Lifeline: 988  ? ?Patient verbalizes understanding of instructions and care plan provided today and agrees to view in MyChart. Active MyChart status confirmed with patient.   ? ?Telephone follow up appointment with care management team member scheduled for: 12/30/21 ? ?Eliese Kerwood, LCSW ?[facility] [employer] ?[phone] ? ?

## 2021-12-30 ENCOUNTER — Ambulatory Visit: Payer: Medicare HMO | Admitting: *Deleted

## 2021-12-30 DIAGNOSIS — I10 Essential (primary) hypertension: Secondary | ICD-10-CM

## 2021-12-30 DIAGNOSIS — G3184 Mild cognitive impairment, so stated: Secondary | ICD-10-CM

## 2021-12-30 DIAGNOSIS — F411 Generalized anxiety disorder: Secondary | ICD-10-CM

## 2021-12-30 NOTE — Patient Instructions (Signed)
Visit Information ? ?Thank you for taking time to visit with me today. Please don't hesitate to contact me if I can be of assistance to you before our next scheduled telephone appointment. ? ?Following are the goals we discussed today:  ?Patient will continue to activley participate in the [facility]  ?Patient's daughter to Psychologist, occupational for follow up with the Meals on Wheels program ?Daughter will contact LCSW directly (# [phone] she has questions, needs assistance with application completion and/or submission, or if additional social work needs are identified between now and our next scheduled telephone outreach call.  ? ?If you are experiencing a Mental Health or Behavioral Health Crisis or need someone to talk to, please call the Suicide and Crisis Lifeline: 988  ? ?Patient verbalizes understanding of instructions and care plan provided today and agrees to view in MyChart. Active MyChart status confirmed with patient.   ? ?No further follow up required: patient's daughter to contact this Child psychotherapist with any additional community resource needs ? ?Cordarrius Coad, LCSW ?[facility] ?[phone] ? ?

## 2021-12-30 NOTE — Chronic Care Management (AMB) (Signed)
?Chronic Care Management  ? ? Clinical Social Work Note ? ?12/30/2021 ?Name: Tiffany Benjamin MRN: [id number] DOB: [birth year] ? ?Tiffany Benjamin is a 88 y.o. year old female who is a primary care patient of Tullo, Aris Everts, MD. The CCM team was consulted to assist the patient with chronic disease management and/or care coordination needs related to: [employer] .  ? ?Collaboration with patient's daughter  for follow up visit in response to provider referral for social work chronic care management and care coordination services.  ? ?Consent to Services:  ?The patient was given information about Chronic Care Management services, agreed to services, and gave verbal consent prior to initiation of services.  Please see initial visit note for detailed documentation.  ? ?Patient agreed to services and consent obtained.  ? ?Assessment: Review of patient past medical history, allergies, medications, and health status, including review of relevant consultants reports was performed today as part of a comprehensive evaluation and provision of chronic care management and care coordination services.    ? ?SDOH (Social Determinants of Health) assessments and interventions performed:   ? ?Advanced Directives Status: Not addressed in this encounter. ? ?CCM Care Plan ? ?No Known Allergies ? ?Outpatient Encounter Medications as of 12/30/2021  ?Medication Sig  ? amLODipine (NORVASC) 10 MG tablet Take 1 tablet (10 mg total) by mouth daily. IN THE MORNING FOR HYPERTENSION  ? aspirin 81 MG tablet Take 81 mg by mouth daily.  ? busPIRone (BUSPAR) 10 MG tablet Take 1 tablet (10 mg total) by mouth 3 (three) times daily.  ? cholecalciferol (VITAMIN D3) 25 MCG (1000 UNIT) tablet Take 1,000 Units by mouth daily.  ? Cyanocobalamin (VITAMIN B-12 PO) Take by mouth daily.  ? hydrochlorothiazide (HYDRODIURIL) 25 MG tablet Take 1 tablet (25 mg total) by mouth daily. In the morning for hypertension  ? metoprolol succinate (TOPROL-XL) 50 MG 24 hr  tablet Take 1 tablet (50 mg total) by mouth at bedtime. FOR HYPERTENSION  ? Multiple Vitamins-Minerals (CENTRUM SILVER PO) Take by mouth daily.  ? sertraline (ZOLOFT) 25 MG tablet Take 1 tablet (25 mg total) by mouth daily.  ? simvastatin (ZOCOR) 20 MG tablet Take 1 tablet (20 mg total) by mouth daily.  ? telmisartan (MICARDIS) 40 MG tablet TAKE 1 TABLET BY MOUTH EVERYDAY AT BEDTIME  ? traMADol (ULTRAM) 50 MG tablet TAKE 1 TABLET (50 MG TOTAL) BY MOUTH EVERY 6 (SIX) HOURS AS NEEDED. MAXIMUM 4 TABLETS DAILY  ? ?Facility-Administered Encounter Medications as of 12/30/2021  ?Medication  ? cloNIDine (CATAPRES) tablet 0.1 mg  ? ? ?Patient Active Problem List  ? Diagnosis Date Noted  ? Chest pain 12/20/2020  ? Fatigue 08/12/2020  ? Chronic kidney disease, stage 3b ([place]) 06/29/2020  ? Osteoarthritis of knees, bilateral 02/22/2020  ? Proximal muscle weakness 06/26/2019  ? Generalized anxiety disorder 07/29/2018  ? Mild cognitive impairment with memory loss 06/10/2018  ? Encounter for preventive health examination 03/02/2016  ? Obesity 02/22/2015  ? Tricompartment Severe Knee OA, Bilateral 03/05/2014  ? Knee pain, bilateral 10/04/2013  ? Medicare annual wellness visit, subsequent 04/02/2013  ? Arthritis   ? Elevated blood pressure reading in office with white coat syndrome, with diagnosis of hypertension   ? Hyperlipidemia   ? History of colonic polyps   ? ? ?Conditions to be addressed/monitored:  ?HTN and Anxiety.  Limited Social Support, Level of Care Concerns, ADL/IADL Limitations, Social Isolation, Limited Access to Caregiver, Cognitive Deficits, Memory Deficits, and Lacks Knowledge of [employer] ?  Care Plan : LCSW Plan of Care  ?Updates made by Vern Claude, LCSW since 12/30/2021 12:00 AM  ?  ? ?Problem: Protect My Health through [facility].   ?Priority: High  ?  ? ?Goal: Protect My Health through [facility].   ?Start Date: 09/09/2021  ?Expected End Date: 12/08/2021  ?This Visit's Progress:  On track  ?Recent Progress: On track  ?Priority: High  ?Note:   ?Current Barriers:   ?Patient with Hypertension, Mild Cognitive Impairment with Memory Loss, Bilateral Osteoarthritis of Knees, Stage III Chronic Kidney Disease, Obesity and Generalized Anxiety Disorder, needs Support, Education, Resources, Referrals, Advocacy, and Care Coordination to resolve unmet personal care needs in the home. ?Patient is unable to self-administer medications as prescribed, or consistently perform ADL's/IADL's independently. ?Lacks knowledge of available community agencies and resources. ?Clinical Goals:  ?Patient will  continue active participation in the [place] [facility] 2x per week ?Patient will attend all scheduled medical appointments, as evidenced by daughter's report and care team review of appointment completion in electronic medical record. ?Patient will demonstrate improved health management independence, as evidenced by active participation in the [place] [facility]. ?Clinical Interventions: ? ?Patient's daughter continues to confirm that patient continues to actively participate in the Adult Day Program at [facility]-patient confirmed start date 12/10/21. Transportation coordinated by [employer]   ?Follow up phone call to [place] to follow up on Meals on Wheels referral-referral made again-patient's daughter provided with the phone number for follow up with referral as well. ?Collaboration with Primary Care Physician, Dr. Deborra Medina regarding development and update of comprehensive plan of care as evidenced by provider attestation and co-signature. ?Inter-disciplinary care team collaboration (see longitudinal plan of care). ?Interventions performed:  Problem Solving/Task-Centered Solutions Identified, Reviewed progress of [employer] provided ?This social worker's contact information provided for any additional resources that may arise ?Patient  Goals/Self-Care Activities:  ?Continue to participate in the [place] [facility] ?Please follow up with referral to Meals on Wheels [phone] ?  ?  ? ?Follow Up Plan:  Patient's daughter will contact this social worker with any additional community resource needs. ?     ?[employer], LCSW ?[place] ?[phone] ? ? ? ?

## 2022-01-01 DIAGNOSIS — I10 Essential (primary) hypertension: Secondary | ICD-10-CM

## 2022-01-15 ENCOUNTER — Ambulatory Visit (INDEPENDENT_AMBULATORY_CARE_PROVIDER_SITE_OTHER): Payer: Medicare HMO | Admitting: Internal Medicine

## 2022-01-15 ENCOUNTER — Encounter: Payer: Self-pay | Admitting: Internal Medicine

## 2022-01-15 DIAGNOSIS — M25562 Pain in left knee: Secondary | ICD-10-CM

## 2022-01-15 DIAGNOSIS — G8929 Other chronic pain: Secondary | ICD-10-CM

## 2022-01-15 DIAGNOSIS — N1832 Chronic kidney disease, stage 3b: Secondary | ICD-10-CM | POA: Diagnosis not present

## 2022-01-15 DIAGNOSIS — M25561 Pain in right knee: Secondary | ICD-10-CM

## 2022-01-15 DIAGNOSIS — M6281 Muscle weakness (generalized): Secondary | ICD-10-CM

## 2022-01-15 MED ORDER — TRAMADOL HCL 50 MG PO TABS: 50.0000 mg | ORAL_TABLET | Freq: Two times a day (BID) | ORAL | 0 refills | Status: DC | PRN

## 2022-01-15 NOTE — Progress Notes (Addendum)
Telephone  Note ? ?This visit type was conducted due to national recommendations for restrictions regarding the COVID-19 pandemic (e.g. social distancing).  This format is felt to be most appropriate for this patient at this time.  All issues noted in this document were discussed and addressed.  No physical exam was performed (except for noted visual exam findings with Video Visits).  ? ?I connected withNAME@ on 01/15/22 at  1:30 PM EDT by r telephone and verified that I am speaking with the correct person using two identifiers. ?Location patient: home ?Location provider: work or home office ?Persons participating in the virtual visit: patient, provider ? ?I discussed the limitations, risks, security and privacy concerns of performing an evaluation and management service by telephone and the availability of in person appointments. I also discussed with the patient that there may be a patient responsible charge related to this service. The patient expressed understanding and agreed to proceed. ? ? ?Reason for visit bilateral knee pain  ? ?HPI: ? ?88 yr old female with bilateral DJD knees , mild cognitive impairment , hypertension and CKD  present via telephone for follow up. ? ?1) Bilateral DJD Knees.:  she states that she is currently Not taking anything for pain . "I just thought it was time to come in for a shot."  Last bilateral injection was Oct 25.  States that knees are feeling weak.  No falls recently.   ? ?2) Mild Cognitive impairment:  patient is now involved in senior activities and per daughter Earlie Server her mood is better.  ? ?3) HTN:  patient is taking amlodipine. Hctz. Metoprolol and telmisartan ; compliance aided by Dorothy's management .  Home readings have ranged from 138/60 to 1 56/80 ? ?ROS: See pertinent positives and negatives per HPI. ? ?Past Medical History:  ?Diagnosis Date  ? Arthritis   ? knees  ? Depression   ? Family history of polyps in the colon   ? History of chicken pox   ? History of  colonic polyps   ? Hyperlipidemia   ? Hypertension   ? Ulcer   ? UTI (urinary tract infection)   ? ? ?Past Surgical History:  ?Procedure Laterality Date  ? BREAST SURGERY Right 2001  ? bernign lumpectomy   ? CATARACT EXTRACTION W/PHACO Left 12/10/2019  ? Procedure: CATARACT EXTRACTION PHACO AND INTRAOCULAR LENS PLACEMENT ([place]) LEFT;  Surgeon: Eulogio Bear, MD;  Location: [place];  Service: Ophthalmology;  Laterality: Left;  CDE 3.55 ?U/S 0:45.9  ? CATARACT EXTRACTION W/PHACO Right 12/31/2019  ? Procedure: CATARACT EXTRACTION PHACO AND INTRAOCULAR LENS PLACEMENT ([place]) RIGHT;  Surgeon: Eulogio Bear, MD;  Location: [place];  Service: Ophthalmology;  Laterality: Right;  4.11 ?0:40.1  ? ? ?Family History  ?Problem Relation Age of Onset  ? Stroke Mother 37  ?     cerebral hemorrhage  ? Hypertension Mother   ? Diabetes Mother   ? Heart disease Father   ? Heart attack Father 71  ? Cancer Sister   ?     pancreatic  ? Cancer Sister 15  ?     retroperitoneal Ca removed, doing fine   ? Hyperlipidemia Son   ? Bone cancer Son   ? Diabetes Son   ? ? ?SOCIAL HX:  reports that she has never smoked. She has never been exposed to tobacco smoke. She has never used smokeless tobacco. She reports current alcohol use of about 5.0 standard drinks per week. She reports that  she does not use drugs.  ? ? ?Current Outpatient Medications:  ?  amLODipine (NORVASC) 10 MG tablet, Take 1 tablet (10 mg total) by mouth daily. IN THE MORNING FOR HYPERTENSION, Disp: 90 tablet, Rfl: 1 ?  aspirin 81 MG tablet, Take 81 mg by mouth daily., Disp: , Rfl:  ?  busPIRone (BUSPAR) 10 MG tablet, Take 1 tablet (10 mg total) by mouth 3 (three) times daily., Disp: 270 tablet, Rfl: 1 ?  cholecalciferol (VITAMIN D3) 25 MCG (1000 UNIT) tablet, Take 1,000 Units by mouth daily., Disp: , Rfl:  ?  Cyanocobalamin (VITAMIN B-12 PO), Take by mouth daily., Disp: , Rfl:  ?  hydrochlorothiazide (HYDRODIURIL) 25 MG tablet, Take 1 tablet (25 mg  total) by mouth daily. In the morning for hypertension, Disp: 90 tablet, Rfl: 1 ?  metoprolol succinate (TOPROL-XL) 50 MG 24 hr tablet, Take 1 tablet (50 mg total) by mouth at bedtime. FOR HYPERTENSION, Disp: 90 tablet, Rfl: 1 ?  Multiple Vitamins-Minerals (CENTRUM SILVER PO), Take by mouth daily., Disp: , Rfl:  ?  sertraline (ZOLOFT) 25 MG tablet, Take 1 tablet (25 mg total) by mouth daily., Disp: 90 tablet, Rfl: 1 ?  simvastatin (ZOCOR) 20 MG tablet, Take 1 tablet (20 mg total) by mouth daily., Disp: 90 tablet, Rfl: 1 ?  telmisartan (MICARDIS) 40 MG tablet, TAKE 1 TABLET BY MOUTH EVERYDAY AT BEDTIME, Disp: 90 tablet, Rfl: 1 ?  traMADol (ULTRAM) 50 MG tablet, Take 1 tablet (50 mg total) by mouth every 12 (twelve) hours as needed for up to 7 days., Disp: 14 tablet, Rfl: 0 ? ?Current Facility-Administered Medications:  ?  cloNIDine (CATAPRES) tablet 0.1 mg, 0.1 mg, Oral, Once, Crecencio Mc, MD ? ?EXAM: ? ? ?General impression: alert, cooperative and articulate.  No signs of being in distress  ?Lungs: speech is fluent sentence length suggests that patient is not short of breath and not punctuated by cough, sneezing or sniffing. [place] Kitchen   ?Psych: affect normal.  speech is articulate and non pressured .  Denies suicidal thoughts  ? ?ASSESSMENT AND PLAN: ? ?Discussed the following assessment and plan: ? ?Chronic kidney disease, stage 3b ([place]) ? ?Chronic pain of both knees ? ?Proximal muscle weakness ? ?Chronic kidney disease, stage 3b ([place]) ?She is able to supply  home readings that are normal to mildly elevated for her age and that  she is taking her medications as directed.  No changes today  ? ?Knee pain, bilateral ?Secondary to advanced DJD of knees bilaterallyAdvised to resume  tylenol and tramadol for pain control.   Return to Dr Sharlet Salina for bilateral I/a injections if needed;  Last procedure was Oct 2022 ? ?Proximal muscle weakness ?She declines PT at this time  ? ?  ?I discussed the assessment and treatment plan  with the patient. The patient was provided an opportunity to ask questions and all were answered. The patient agreed with the plan and demonstrated an understanding of the instructions. ?  ?The patient was advised to call back or seek an in-person evaluation if the symptoms worsen or if the condition fails to improve as anticipated. ? ? ?I spent  18 minutes dedicated to the care of this patient on the date of this encounter to include pre-visit review of   her last orthopedic evaluation in October 2022 ,   her  medical history,  non  Face-to-face time with the patient , and post visit ordering of testing and therapeutics.  ? ? ?Crecencio Mc,  MD   ?

## 2022-01-15 NOTE — Assessment & Plan Note (Signed)
She is able to supply  home readings that are normal to mildly elevated for her age and that  she is taking her medications as directed.  No changes today  ?

## 2022-01-15 NOTE — Assessment & Plan Note (Signed)
She declines PT at this time  ?

## 2022-01-15 NOTE — Assessment & Plan Note (Addendum)
Secondary to advanced DJD of knees bilaterallyAdvised to resume  tylenol and tramadol for pain control.   Return to Dr Okey Dupre for bilateral I/a injections if needed;  Last procedure was Oct 2022 ?

## 2022-01-15 NOTE — Patient Instructions (Signed)
Dorothy, ? ?Please have Nichola start taking 1000 mg tylenol 2 times daily (every 12 hours) ? ?She can add tramadol (which I have refilled)  if needed.  Twice daily  ? ? ?If she continues have "weakness,"  call the Orthopedist office and schedule another visit for injections (her last ones were done by Dr Scherrie Merritts in October 2022)  ?

## 2022-01-27 ENCOUNTER — Ambulatory Visit (INDEPENDENT_AMBULATORY_CARE_PROVIDER_SITE_OTHER): Payer: Medicare HMO | Admitting: Internal Medicine

## 2022-01-27 ENCOUNTER — Encounter: Payer: Self-pay | Admitting: Internal Medicine

## 2022-01-27 DIAGNOSIS — M25561 Pain in right knee: Secondary | ICD-10-CM

## 2022-01-27 DIAGNOSIS — G8929 Other chronic pain: Secondary | ICD-10-CM | POA: Diagnosis not present

## 2022-01-27 DIAGNOSIS — M25562 Pain in left knee: Secondary | ICD-10-CM | POA: Diagnosis not present

## 2022-01-27 NOTE — Progress Notes (Signed)
Telephone  Note ? ?This visit type was conducted due to national recommendations for restrictions regarding the COVID-19 pandemic (e.g. social distancing).  This format is felt to be most appropriate for this patient at this time.  All issues noted in this document were discussed and addressed.  No physical exam was performed (except for noted visual exam findings with Video Visits).  ? ?I connected withNAME@ on 01/27/22 at  4:30 PM EDT by  telephone and verified that I am speaking with the correct person using two identifiers. ?Location patient: home ?Location provider: work or home office ?Persons participating in the virtual visit: patient, provider ? ?I discussed the limitations, risks, security and privacy concerns of performing an evaluation and management service by telephone and the availability of in person appointments. I also discussed with the patient that there may be a patient responsible charge related to this service. The patient expressed understanding and agreed to proceed. ? ? ?Reason for visit: knee pain  ? ?HPI: ? ?88 yr old female with mild cognitive impairement iwht memory loss ,  CKD stage 3  severe tri compartment DJD of both knees presents for chronic complaints of knee pain  .  Last encounter April 14,  tramadol prescribed and advised to contact [facility] to arrange bilateral steroid inejctions .  She was originally referred to [facility] for steroid injections  in 2022 and had her last injections on October 25. She states today that the tramadol helps "some" but she would prefer to return to me for injections because they were less painful than when the orthopedist did them.  ? ?ROS: See pertinent positives and negatives per HPI. ? ?Past Medical History:  ?Diagnosis Date  ? Arthritis   ? knees  ? Depression   ? Family history of polyps in the colon   ? History of chicken pox   ? History of colonic polyps   ? Hyperlipidemia   ? Hypertension   ? Ulcer   ? UTI (urinary tract  infection)   ? ? ?Past Surgical History:  ?Procedure Laterality Date  ? BREAST SURGERY Right 2001  ? bernign lumpectomy   ? CATARACT EXTRACTION W/PHACO Left 12/10/2019  ? Procedure: CATARACT EXTRACTION PHACO AND INTRAOCULAR LENS PLACEMENT ([place] [place]) LEFT;  Surgeon: Eulogio Bear, MD;  Location: [place];  Service: Ophthalmology;  Laterality: Left;  CDE 3.55 ?U/S 0:45.9  ? CATARACT EXTRACTION W/PHACO Right 12/31/2019  ? Procedure: CATARACT EXTRACTION PHACO AND INTRAOCULAR LENS PLACEMENT ([place]) RIGHT;  Surgeon: Eulogio Bear, MD;  Location: [place];  Service: Ophthalmology;  Laterality: Right;  4.11 ?0:40.1  ? ? ?Family History  ?Problem Relation Age of Onset  ? Stroke Mother 53  ?     cerebral hemorrhage  ? Hypertension Mother   ? Diabetes Mother   ? Heart disease Father   ? Heart attack Father 65  ? Cancer Sister   ?     pancreatic  ? Cancer Sister 43  ?     retroperitoneal Ca removed, doing fine   ? Hyperlipidemia Son   ? Bone cancer Son   ? Diabetes Son   ? ? ?SOCIAL HX:  reports that she has never smoked. She has never been exposed to tobacco smoke. She has never used smokeless tobacco. She reports current alcohol use of about 5.0 standard drinks per week. She reports that she does not use drugs.  ? ? ?Current Outpatient Medications:  ?  amLODipine (NORVASC) 10 MG tablet, Take  1 tablet (10 mg total) by mouth daily. IN THE MORNING FOR HYPERTENSION, Disp: 90 tablet, Rfl: 1 ?  aspirin 81 MG tablet, Take 81 mg by mouth daily., Disp: , Rfl:  ?  busPIRone (BUSPAR) 10 MG tablet, Take 1 tablet (10 mg total) by mouth 3 (three) times daily., Disp: 270 tablet, Rfl: 1 ?  cholecalciferol (VITAMIN D3) 25 MCG (1000 UNIT) tablet, Take 1,000 Units by mouth daily., Disp: , Rfl:  ?  Cyanocobalamin (VITAMIN B-12 PO), Take by mouth daily., Disp: , Rfl:  ?  hydrochlorothiazide (HYDRODIURIL) 25 MG tablet, Take 1 tablet (25 mg total) by mouth daily. In the morning for hypertension, Disp: 90 tablet, Rfl: 1 ?   metoprolol succinate (TOPROL-XL) 50 MG 24 hr tablet, Take 1 tablet (50 mg total) by mouth at bedtime. FOR HYPERTENSION, Disp: 90 tablet, Rfl: 1 ?  Multiple Vitamins-Minerals (CENTRUM SILVER PO), Take by mouth daily., Disp: , Rfl:  ?  sertraline (ZOLOFT) 25 MG tablet, Take 1 tablet (25 mg total) by mouth daily., Disp: 90 tablet, Rfl: 1 ?  simvastatin (ZOCOR) 20 MG tablet, Take 1 tablet (20 mg total) by mouth daily., Disp: 90 tablet, Rfl: 1 ?  telmisartan (MICARDIS) 40 MG tablet, TAKE 1 TABLET BY MOUTH EVERYDAY AT BEDTIME, Disp: 90 tablet, Rfl: 1 ? ?Current Facility-Administered Medications:  ?  cloNIDine (CATAPRES) tablet 0.1 mg, 0.1 mg, Oral, Once, Crecencio Mc, MD ? ?EXAM: ? ?VITALS per patient if applicable: ? ?GENERAL: alert, oriented, appears well and in no acute distress ? ?HEENT: atraumatic, conjunttiva clear, no obvious abnormalities on inspection of external nose and ears ? ?NECK: normal movements of the head and neck ? ?LUNGS: on inspection no signs of respiratory distress, breathing rate appears normal, no obvious gross SOB, gasping or wheezing ? ?CV: no obvious cyanosis ? ?MS: moves all visible extremities without noticeable abnormality ? ?PSYCH/NEURO: pleasant and cooperative, no obvious depression or anxiety, speech and thought processing grossly intact ? ?ASSESSMENT AND PLAN: ? ?Discussed the following assessment and plan: ? ?Chronic pain of both knees ? ?Knee pain, bilateral ?Not controlled with tramadol.   She will return for bilateral I/A steroid injections;  Her last ones were done by Orthopedics in October 2023  ? ?  ?I discussed the assessment and treatment plan with the patient. The patient was provided an opportunity to ask questions and all were answered. The patient agreed with the plan and demonstrated an understanding of the instructions. ?  ?The patient was advised to call back or seek an in-person evaluation if the symptoms worsen or if the condition fails to improve as  anticipated. ? ? ?I spent 20 minutes dedicated to the care of this patient on the date of this encounter to include pre-visit review of her medical history,  Fnon ace-to-face time with the patient , and post visit ordering of testing and therapeutics.  ? ? ?Crecencio Mc, MD   ?

## 2022-01-28 NOTE — Assessment & Plan Note (Signed)
Not controlled with tramadol.   She will return for bilateral I/A steroid injections;  Her last ones were done by Orthopedics in October 2023  ?

## 2022-05-09 ENCOUNTER — Other Ambulatory Visit: Payer: Self-pay | Admitting: Internal Medicine

## 2022-05-26 ENCOUNTER — Ambulatory Visit (INDEPENDENT_AMBULATORY_CARE_PROVIDER_SITE_OTHER): Payer: Medicare HMO | Admitting: Internal Medicine

## 2022-05-26 ENCOUNTER — Encounter: Payer: Self-pay | Admitting: Internal Medicine

## 2022-05-26 VITALS — BP 182/82 | HR 66 | Temp 98.2°F | Ht 61.5 in | Wt 161.6 lb

## 2022-05-26 DIAGNOSIS — M25562 Pain in left knee: Secondary | ICD-10-CM

## 2022-05-26 DIAGNOSIS — I10 Essential (primary) hypertension: Secondary | ICD-10-CM

## 2022-05-26 DIAGNOSIS — G8929 Other chronic pain: Secondary | ICD-10-CM

## 2022-05-26 DIAGNOSIS — N1832 Chronic kidney disease, stage 3b: Secondary | ICD-10-CM

## 2022-05-26 DIAGNOSIS — M25561 Pain in right knee: Secondary | ICD-10-CM

## 2022-05-26 MED ORDER — LIDOCAINE HCL 1 % IJ SOLN
4.0000 mL | Freq: Once | INTRAMUSCULAR | Status: AC
  Administered 2022-05-26: 4 mL

## 2022-05-26 MED ORDER — TRIAMCINOLONE ACETONIDE 40 MG/ML IJ SUSP
40.0000 mg | Freq: Once | INTRAMUSCULAR | Status: AC
  Administered 2022-05-26: 40 mg via INTRA_ARTICULAR

## 2022-05-26 MED ORDER — METHYLPREDNISOLONE ACETATE 40 MG/ML IJ SUSP: 40.0000 mg | Freq: Once | INTRAMUSCULAR | Status: DC

## 2022-05-26 NOTE — Patient Instructions (Signed)
Please schedule Tiffany Benjamin for a non fasting lab appt sometime in the next month

## 2022-05-26 NOTE — Progress Notes (Unsigned)
Subjective:  Patient ID: Tiffany Benjamin, female    DOB: 1932/12/18  Age: 88 y.o. MRN: [id number]  CC: The primary encounter diagnosis was Chronic kidney disease, stage 3b (HCC). Diagnoses of Chronic pain of both knees and Elevated blood pressure reading in office with white coat syndrome, with diagnosis of hypertension were also pertinent to this visit.   HPI Tiffany Benjamin presents for follow up on hypertension and management of bilateral knee pain .  Chief Complaint  Patient presents with   knee injections   1) Bilateral knee pain:  she is taking tylenol 1000 mg once daily.  No recent falls.  Her pain is activity limiting.  She is requesting I/a steroid injections  2) HTN:  Patient is taking her medications as prescribed and notes no adverse effects.  Home BP readings have been done about once per week and are  generally < 130/80 .  She is avoiding added salt in her diet .  She is not  walking regularly k for exercise  . Ue to knee pain but is capable of performing  ADL's   Outpatient Medications Prior to Visit  Medication Sig Dispense Refill   amLODipine (NORVASC) 10 MG tablet TAKE 1 TABLET (10 MG TOTAL) BY MOUTH DAILY. IN THE MORNING FOR HYPERTENSION 90 tablet 1   aspirin 81 MG tablet Take 81 mg by mouth daily.     busPIRone (BUSPAR) 10 MG tablet TAKE 1 TABLET BY MOUTH THREE TIMES A DAY 270 tablet 1   cholecalciferol (VITAMIN D3) 25 MCG (1000 UNIT) tablet Take 1,000 Units by mouth daily.     Cyanocobalamin (VITAMIN B-12 PO) Take by mouth daily.     hydrochlorothiazide (HYDRODIURIL) 25 MG tablet Take 1 tablet (25 mg total) by mouth daily. In the morning for hypertension 90 tablet 1   metoprolol succinate (TOPROL-XL) 50 MG 24 hr tablet TAKE 1 TABLET (50 MG TOTAL) BY MOUTH AT BEDTIME. FOR HYPERTENSION 90 tablet 1   Multiple Vitamins-Minerals (CENTRUM SILVER PO) Take by mouth daily.     sertraline (ZOLOFT) 25 MG tablet TAKE 1 TABLET (25 MG TOTAL) BY MOUTH DAILY. 90 tablet 1    simvastatin (ZOCOR) 20 MG tablet Take 1 tablet (20 mg total) by mouth daily. 90 tablet 1   telmisartan (MICARDIS) 40 MG tablet TAKE 1 TABLET BY MOUTH EVERYDAY AT BEDTIME 90 tablet 1   Facility-Administered Medications Prior to Visit  Medication Dose Route Frequency Provider Last Rate Last Admin   cloNIDine (CATAPRES) tablet 0.1 mg  0.1 mg Oral Once Sherlene Shams, MD        Review of Systems;  Patient denies headache, fevers, malaise, unintentional weight loss, skin rash, eye pain, sinus congestion and sinus pain, sore throat, dysphagia,  hemoptysis , cough, dyspnea, wheezing, chest pain, palpitations, orthopnea, edema, abdominal pain, nausea, melena, diarrhea, constipation, flank pain, dysuria, hematuria, urinary  Frequency, nocturia, numbness, tingling, seizures,  Focal weakness, Loss of consciousness,  Tremor, insomnia, depression, anxiety, and suicidal ideation.      Objective:  BP (!) 182/82 (BP Location: Left Arm, Patient Position: Sitting, Cuff Size: Normal)   Pulse 66   Temp 98.2 F (36.8 C) (Oral)   Ht 5' 1.5" (1.562 m)   Wt 161 lb 9.6 oz (73.3 kg)   SpO2 98%   BMI 30.04 kg/m   BP Readings from Last 3 Encounters:  05/26/22 (!) 182/82  01/27/22 140/80  01/15/22 (!) 156/80    Wt Readings from Last 3 Encounters:  05/26/22 161 lb 9.6 oz (73.3 kg)  01/27/22 169 lb (76.7 kg)  01/15/22 169 lb (76.7 kg)    General appearance: alert, cooperative and appears stated age Neck: no adenopathy, no carotid bruit, supple, symmetrical, trachea midline and thyroid not enlarged, symmetric, no tenderness/mass/nodules Back: symmetric, no curvature. ROM normal. No CVA tenderness. Lungs: clear to auscultation bilaterally Heart: regular rate and rhythm, S1, S2 normal, no murmur, click, rub or gallop Abdomen: soft, non-tender; bowel sounds normal; no masses,  no organomegaly Pulses: 2+ and symmetric Skin: Skin color, texture, turgor normal. No rashes or lesions Lymph nodes: Cervical,  supraclavicular, and axillary nodes normal. MSK: bilateral knees without erythema and effusions   Lab Results  Component Value Date   HGBA1C 5.7 07/15/2021   HGBA1C 5.7 02/07/2020   HGBA1C 5.9 06/26/2019    Lab Results  Component Value Date   CREATININE 1.44 (H) 07/15/2021   CREATININE 1.22 (H) 12/19/2020   CREATININE 1.52 (H) 08/11/2020    Lab Results  Component Value Date   WBC 5.7 07/15/2021   HGB 12.2 07/15/2021   HCT 37.3 07/15/2021   PLT 248.0 07/15/2021   GLUCOSE 84 07/15/2021   CHOL 180 07/15/2021   TRIG 82.0 07/15/2021   HDL 83.70 07/15/2021   LDLDIRECT 81.0 12/09/2016   LDLCALC 80 07/15/2021   ALT 12 07/15/2021   AST 19 07/15/2021   NA 142 07/15/2021   K 4.1 07/15/2021   CL 103 07/15/2021   CREATININE 1.44 (H) 07/15/2021   BUN 22 07/15/2021   CO2 30 07/15/2021   TSH 0.87 08/11/2020   HGBA1C 5.7 07/15/2021   MICROALBUR 5.2 (H) 07/15/2021    No results found.  Assessment & Plan:   Problem List Items Addressed This Visit     Chronic kidney disease, stage 3b (HCC) - Primary   Relevant Orders   Comprehensive metabolic panel   CBC with Differential/Platelet   Elevated blood pressure reading in office with white coat syndrome, with diagnosis of hypertension    She is adamant that her home readings are normal for her age and that  she is taking her medications as directed.  No changes today       Knee pain, bilateral    Secondary to severe tricompartmental OA .  Advised to increase use of tylenol to 1000 mg bid.Patient has requested a steroid injection for relief of pain not controlled with oral medications.    Informed consent for joint injection obtained after discussion of the risks of infection and bleeding were reviewed  .  Area was cleaned with betadine.  Medial side of left knee  was injected with 40 mg Kenalog and 4 ml of 1% Xylocaine with a 22 gauge needle under sterile conditions.   Procedure was tolerated well and knee was feeling less painful by  the time she left the office.  he was advised to rest the knee for 48 hours , apply ice packs every 6 hours for 15 minutes, advised to call if he develops signs of infection (REDNESS, PAIN,  WARMTH)   After obtaining informed consent for  an I/A injection of the right knee,  The right knee was cleaned with betadine and alcohol.  Topical anesthetic was sprayed on medial side of patella and 40 mg Kenalog mixed with 4 ml 1% lidocaine was injected without difficulty into the bursa,  Patient tolerated the procedure without complications or bleeding.  Patient was advised to apply ice for 15 minutes every few hours for the first  24 hours and to avoid strenuous activity for up to 7 days          Follow-up: Return in about 6 months (around 11/26/2022).   Crecencio Mc, MD

## 2022-05-27 NOTE — Assessment & Plan Note (Signed)
Secondary to severe tricompartmental OA .  Advised to increase use of tylenol to 1000 mg bid.Patient has requested a steroid injection for relief of pain not controlled with oral medications.    Informed consent for joint injection obtained after discussion of the risks of infection and bleeding were reviewed  .  Area was cleaned with betadine.  Medial side of left knee  was injected with 40 mg Kenalog and 4 ml of 1% Xylocaine with a 22 gauge needle under sterile conditions.   Procedure was tolerated well and knee was feeling less painful by the time she left the office.  he was advised to rest the knee for 48 hours , apply ice packs every 6 hours for 15 minutes, advised to call if he develops signs of infection (REDNESS, PAIN,  WARMTH)   After obtaining informed consent for  an I/A injection of the right knee,  The right knee was cleaned with betadine and alcohol.  Topical anesthetic was sprayed on medial side of patella and 40 mg Kenalog mixed with 4 ml 1% lidocaine was injected without difficulty into the bursa,  Patient tolerated the procedure without complications or bleeding.  Patient was advised to apply ice for 15 minutes every few hours for the first 24 hours and to avoid strenuous activity for up to 7 days

## 2022-05-27 NOTE — Assessment & Plan Note (Signed)
She is adamant that her home readings are normal for her age and that  she is taking her medications as directed.  No changes today

## 2022-06-02 ENCOUNTER — Other Ambulatory Visit (INDEPENDENT_AMBULATORY_CARE_PROVIDER_SITE_OTHER): Payer: Self-pay

## 2022-06-02 DIAGNOSIS — N1832 Chronic kidney disease, stage 3b: Secondary | ICD-10-CM

## 2022-06-02 LAB — COMPREHENSIVE METABOLIC PANEL
ALT: 21 U/L (ref 0–35)
AST: 18 U/L (ref 0–37)
Albumin: 4.3 g/dL (ref 3.5–5.2)
Alkaline Phosphatase: 75 U/L (ref 39–117)
BUN: 27 mg/dL — ABNORMAL HIGH (ref 6–23)
CO2: 28 mEq/L (ref 19–32)
Calcium: 9.3 mg/dL (ref 8.4–10.5)
Chloride: 104 mEq/L (ref 96–112)
Creatinine, Ser: 1.29 mg/dL — ABNORMAL HIGH (ref 0.40–1.20)
GFR: 37.32 mL/min — ABNORMAL LOW (ref >60.00)
Glucose, Bld: 79 mg/dL (ref 70–99)
Potassium: 4.4 mEq/L (ref 3.5–5.1)
Sodium: 142 mEq/L (ref 135–145)
Total Bilirubin: 0.7 mg/dL (ref 0.2–1.2)
Total Protein: 7.5 g/dL (ref 6.0–8.3)

## 2022-06-02 LAB — CBC WITH DIFFERENTIAL/PLATELET
Basophils Absolute: 0 10*3/uL (ref 0.0–0.1)
Basophils Relative: 0.5 % (ref 0.0–3.0)
Eosinophils Absolute: 0.1 10*3/uL (ref 0.0–0.7)
Eosinophils Relative: 1.3 % (ref 0.0–5.0)
HCT: 38.4 % (ref 36.0–46.0)
Hemoglobin: 12.8 g/dL (ref 12.0–15.0)
Lymphocytes Relative: 30.5 % (ref 12.0–46.0)
Lymphs Abs: 2.2 10*3/uL (ref 0.7–4.0)
MCHC: 33.4 g/dL (ref 30.0–36.0)
MCV: 96.9 fl (ref 78.0–100.0)
Monocytes Absolute: 0.7 10*3/uL (ref 0.1–1.0)
Monocytes Relative: 9.8 % (ref 3.0–12.0)
Neutro Abs: 4.2 10*3/uL (ref 1.4–7.7)
Neutrophils Relative %: 57.9 % (ref 43.0–77.0)
Platelets: 250 10*3/uL (ref 150.0–400.0)
RBC: 3.96 Mil/uL (ref 3.87–5.11)
RDW: 16.3 % — ABNORMAL HIGH (ref 11.5–15.5)
WBC: 7.3 10*3/uL (ref 4.0–10.5)

## 2022-08-07 ENCOUNTER — Other Ambulatory Visit: Payer: Self-pay | Admitting: Internal Medicine

## 2022-08-09 NOTE — Telephone Encounter (Signed)
Refilled: 01/15/2022 Last OV: 05/26/2022 Next OV: not scheduled

## 2022-08-12 ENCOUNTER — Other Ambulatory Visit: Payer: Self-pay | Admitting: Internal Medicine

## 2022-09-13 ENCOUNTER — Telehealth: Payer: Self-pay | Admitting: Internal Medicine

## 2022-09-13 NOTE — Telephone Encounter (Unsigned)
Pt daughter called stating pt need a check-up done for the memory care place on a monday or wednesday and need paperwork filled out. The next available will be for the provider is in January. Pt daughter stated that is to late

## 2022-09-22 ENCOUNTER — Ambulatory Visit: Payer: Medicare HMO | Admitting: Internal Medicine

## 2022-10-06 ENCOUNTER — Encounter: Payer: Self-pay | Admitting: Internal Medicine

## 2022-10-06 ENCOUNTER — Ambulatory Visit (INDEPENDENT_AMBULATORY_CARE_PROVIDER_SITE_OTHER): Payer: Medicare HMO | Admitting: Internal Medicine

## 2022-10-06 VITALS — BP 144/70 | HR 114 | Temp 97.7°F | Ht 61.5 in | Wt 157.0 lb

## 2022-10-06 DIAGNOSIS — F01A4 Vascular dementia, mild, with anxiety: Secondary | ICD-10-CM

## 2022-10-06 DIAGNOSIS — G8929 Other chronic pain: Secondary | ICD-10-CM | POA: Diagnosis not present

## 2022-10-06 DIAGNOSIS — M25562 Pain in left knee: Secondary | ICD-10-CM | POA: Diagnosis not present

## 2022-10-06 DIAGNOSIS — N1832 Chronic kidney disease, stage 3b: Secondary | ICD-10-CM

## 2022-10-06 DIAGNOSIS — M25561 Pain in right knee: Secondary | ICD-10-CM

## 2022-10-06 DIAGNOSIS — Z23 Encounter for immunization: Secondary | ICD-10-CM | POA: Diagnosis not present

## 2022-10-06 DIAGNOSIS — I10 Essential (primary) hypertension: Secondary | ICD-10-CM | POA: Diagnosis not present

## 2022-10-06 DIAGNOSIS — F0150 Vascular dementia without behavioral disturbance: Secondary | ICD-10-CM | POA: Insufficient documentation

## 2022-10-06 NOTE — Progress Notes (Unsigned)
Subjective:  Patient ID: Tiffany Benjamin, female    DOB: 04-18-1933  Age: 89 y.o. MRN: [id number]  CC: There were no encounter diagnoses.   HPI Tiffany Benjamin presents for  Chief Complaint  Patient presents with   Medical Management of Chronic Issues   1) mild dementia  attending the senior center gets breakfast lunch and a snack.    2) hypertension ;  taking 4 meds   3) obesity:  reducing her  portions size,  wt loss is intended   4) OA knees. Last injection by me in April.   Outpatient Medications Prior to Visit  Medication Sig Dispense Refill   amLODipine (NORVASC) 10 MG tablet TAKE 1 TABLET (10 MG TOTAL) BY MOUTH DAILY. IN THE MORNING FOR HYPERTENSION 90 tablet 1   aspirin 81 MG tablet Take 81 mg by mouth daily.     busPIRone (BUSPAR) 10 MG tablet TAKE 1 TABLET BY MOUTH THREE TIMES A DAY 270 tablet 1   cholecalciferol (VITAMIN D3) 25 MCG (1000 UNIT) tablet Take 1,000 Units by mouth daily.     Cyanocobalamin (VITAMIN B-12 PO) Take by mouth daily.     hydrochlorothiazide (HYDRODIURIL) 25 MG tablet Take 1 tablet (25 mg total) by mouth daily. In the morning for hypertension 90 tablet 1   Multiple Vitamins-Minerals (CENTRUM SILVER PO) Take by mouth daily.     sertraline (ZOLOFT) 25 MG tablet TAKE 1 TABLET (25 MG TOTAL) BY MOUTH DAILY. 90 tablet 1   simvastatin (ZOCOR) 20 MG tablet TAKE 1 TABLET BY MOUTH EVERY DAY 90 tablet 1   telmisartan (MICARDIS) 40 MG tablet TAKE 1 TABLET BY MOUTH EVERYDAY AT BEDTIME 90 tablet 1   traMADol (ULTRAM) 50 MG tablet Take 1 tablet (50 mg total) by mouth daily as needed for moderate pain. 30 tablet 2   metoprolol succinate (TOPROL-XL) 50 MG 24 hr tablet TAKE 1 TABLET (50 MG TOTAL) BY MOUTH AT BEDTIME. FOR HYPERTENSION 90 tablet 1   Facility-Administered Medications Prior to Visit  Medication Dose Route Frequency Provider Last Rate Last Admin   cloNIDine (CATAPRES) tablet 0.1 mg  0.1 mg Oral Once Crecencio Mc, MD        Review of  Systems;  Patient denies headache, fevers, malaise, unintentional weight loss, skin rash, eye pain, sinus congestion and sinus pain, sore throat, dysphagia,  hemoptysis , cough, dyspnea, wheezing, chest pain, palpitations, orthopnea, edema, abdominal pain, nausea, melena, diarrhea, constipation, flank pain, dysuria, hematuria, urinary  Frequency, nocturia, numbness, tingling, seizures,  Focal weakness, Loss of consciousness,  Tremor, insomnia, depression, anxiety, and suicidal ideation.      Objective:  BP (!) 144/70   Pulse (!) 114   Temp 97.7 F (36.5 C) (Oral)   Ht 5' 1.5" (1.562 m)   Wt 157 lb (71.2 kg)   SpO2 99%   BMI 29.18 kg/m   BP Readings from Last 3 Encounters:  10/06/22 (!) 144/70  05/26/22 (!) 182/82  01/27/22 140/80    Wt Readings from Last 3 Encounters:  10/06/22 157 lb (71.2 kg)  05/26/22 161 lb 9.6 oz (73.3 kg)  01/27/22 169 lb (76.7 kg)    Physical Exam  Lab Results  Component Value Date   HGBA1C 5.7 07/15/2021   HGBA1C 5.7 02/07/2020   HGBA1C 5.9 06/26/2019    Lab Results  Component Value Date   CREATININE 1.29 (H) 06/02/2022   CREATININE 1.44 (H) 07/15/2021   CREATININE 1.22 (H) 12/19/2020    Lab Results  Component Value Date   WBC 7.3 06/02/2022   HGB 12.8 06/02/2022   HCT 38.4 06/02/2022   PLT 250.0 06/02/2022   GLUCOSE 79 06/02/2022   CHOL 180 07/15/2021   TRIG 82.0 07/15/2021   HDL 83.70 07/15/2021   LDLDIRECT 81.0 12/09/2016   LDLCALC 80 07/15/2021   ALT 21 06/02/2022   AST 18 06/02/2022   NA 142 06/02/2022   K 4.4 06/02/2022   CL 104 06/02/2022   CREATININE 1.29 (H) 06/02/2022   BUN 27 (H) 06/02/2022   CO2 28 06/02/2022   TSH 0.87 08/11/2020   HGBA1C 5.7 07/15/2021   MICROALBUR 5.2 (H) 07/15/2021    No results found.  Assessment & Plan:  .There are no diagnoses linked to this encounter.   I provided 30 minutes of face-to-face time during this encounter reviewing patient's last visit with me, patient's  most recent  visit with cardiology,  nephrology,  and neurology,  recent surgical and non surgical procedures, previous  labs and imaging studies, counseling on currently addressed issues,  and post visit ordering to diagnostics and therapeutics .   Follow-up: No follow-ups on file.   Crecencio Mc, MD

## 2022-10-06 NOTE — Patient Instructions (Addendum)
Check to see if losartan or olmesartan is lower tier than telmisartan

## 2022-10-07 NOTE — Assessment & Plan Note (Signed)
She is taking her medications as directed.  No changes today

## 2022-10-07 NOTE — Assessment & Plan Note (Signed)
Renal function is at  baseline with avoidance of NSAIDs.  She is on an ARB for control of hypertension,, and statin for control of hyperlipidemia.    Lab Results  Component Value Date   CREATININE 1.29 (H) 06/02/2022   Lab Results  Component Value Date   NA 142 06/02/2022   K 4.4 06/02/2022   CL 104 06/02/2022   CO2 28 06/02/2022

## 2022-10-07 NOTE — Assessment & Plan Note (Signed)
Non progressive.  She is attending a daily senior program for patients with dementia and her demeanor and mood have improved.

## 2022-10-07 NOTE — Assessment & Plan Note (Signed)
Secondary to severe tricompartmental OA .  Advised to  continue  use of tylenol  1000 mg bid ad return for bilateral steroid injections when pain is severe.  Last injection was April 2023

## 2022-10-29 ENCOUNTER — Other Ambulatory Visit: Payer: Self-pay | Admitting: Internal Medicine

## 2022-11-25 ENCOUNTER — Other Ambulatory Visit: Payer: Self-pay | Admitting: Internal Medicine

## 2022-11-25 DIAGNOSIS — F01A4 Vascular dementia, mild, with anxiety: Secondary | ICD-10-CM

## 2022-11-25 MED ORDER — QUETIAPINE FUMARATE 25 MG PO TABS: 25.0000 mg | ORAL_TABLET | Freq: Every day | ORAL | 1 refills | Status: DC

## 2022-11-25 NOTE — Assessment & Plan Note (Signed)
With sundowning reported every evening by daughter Tiffany Benjamin. Adding seroquel starting dose 25 mg before supper

## 2022-11-26 ENCOUNTER — Other Ambulatory Visit: Payer: Self-pay | Admitting: Internal Medicine

## 2022-12-20 ENCOUNTER — Encounter: Payer: Self-pay | Admitting: Internal Medicine

## 2022-12-20 ENCOUNTER — Ambulatory Visit (INDEPENDENT_AMBULATORY_CARE_PROVIDER_SITE_OTHER): Payer: Medicare PPO | Admitting: Internal Medicine

## 2022-12-20 VITALS — BP 148/78 | HR 63 | Temp 98.1°F | Ht 61.5 in | Wt 158.4 lb

## 2022-12-20 DIAGNOSIS — R7303 Prediabetes: Secondary | ICD-10-CM | POA: Diagnosis not present

## 2022-12-20 DIAGNOSIS — F01A11 Vascular dementia, mild, with agitation: Secondary | ICD-10-CM

## 2022-12-20 DIAGNOSIS — R5383 Other fatigue: Secondary | ICD-10-CM

## 2022-12-20 DIAGNOSIS — E785 Hyperlipidemia, unspecified: Secondary | ICD-10-CM

## 2022-12-20 DIAGNOSIS — I10 Essential (primary) hypertension: Secondary | ICD-10-CM | POA: Diagnosis not present

## 2022-12-20 DIAGNOSIS — M170 Bilateral primary osteoarthritis of knee: Secondary | ICD-10-CM | POA: Diagnosis not present

## 2022-12-20 NOTE — Progress Notes (Signed)
Subjective:  Patient ID: Tiffany Benjamin, female    DOB: Apr 28, 1933  Age: 89 y.o. MRN: [id number]  CC: The primary encounter diagnosis was Essential hypertension. Diagnoses of Hyperlipidemia, unspecified hyperlipidemia type, Fatigue, unspecified type, and Prediabetes were also pertinent to this visit.   HPI Tiffany Benjamin presents for  Chief Complaint  Patient presents with  . trouble sleeping and very emotional    FOLLOW UP ON VASCULAR DEMENTIA AND HYPERTENSION.  SHE IS ACCOMPANIED BY HER DAUGHTER DOROTHY SANDS.   HAS NOT STARTED SEROQUEL FOR SUNDOWNING.  NOT SURE IF TAKING BUSPIRONE YET.     Outpatient Medications Prior to Visit  Medication Sig Dispense Refill  . amLODipine (NORVASC) 10 MG tablet TAKE 1 TABLET (10 MG TOTAL) BY MOUTH DAILY. IN THE MORNING FOR HYPERTENSION 90 tablet 1  . aspirin 81 MG tablet Take 81 mg by mouth daily.    . busPIRone (BUSPAR) 10 MG tablet TAKE 1 TABLET BY MOUTH THREE TIMES A DAY 270 tablet 1  . cholecalciferol (VITAMIN D3) 25 MCG (1000 UNIT) tablet Take 1,000 Units by mouth daily.    . Cyanocobalamin (VITAMIN B-12 PO) Take by mouth daily.    . hydrochlorothiazide (HYDRODIURIL) 25 MG tablet Take 1 tablet (25 mg total) by mouth daily. In the morning for hypertension 90 tablet 1  . metoprolol succinate (TOPROL-XL) 50 MG 24 hr tablet TAKE 1 TABLET (50 MG TOTAL) BY MOUTH AT BEDTIME. FOR HYPERTENSION 90 tablet 1  . Multiple Vitamins-Minerals (CENTRUM SILVER PO) Take by mouth daily.    . sertraline (ZOLOFT) 25 MG tablet TAKE 1 TABLET (25 MG TOTAL) BY MOUTH DAILY. 90 tablet 1  . simvastatin (ZOCOR) 20 MG tablet TAKE 1 TABLET BY MOUTH EVERY DAY 90 tablet 1  . telmisartan (MICARDIS) 40 MG tablet TAKE 1 TABLET BY MOUTH EVERYDAY AT BEDTIME 90 tablet 1  . QUEtiapine (SEROQUEL) 25 MG tablet Take 1 tablet (25 mg total) by mouth daily before supper. (Patient not taking: Reported on 12/20/2022) 90 tablet 1   Facility-Administered Medications Prior to Visit   Medication Dose Route Frequency Provider Last Rate Last Admin  . cloNIDine (CATAPRES) tablet 0.1 mg  0.1 mg Oral Once Crecencio Mc, MD        Review of Systems;  Patient denies headache, fevers, malaise, unintentional weight loss, skin rash, eye pain, sinus congestion and sinus pain, sore throat, dysphagia,  hemoptysis , cough, dyspnea, wheezing, chest pain, palpitations, orthopnea, edema, abdominal pain, nausea, melena, diarrhea, constipation, flank pain, dysuria, hematuria, urinary  Frequency, nocturia, numbness, tingling, seizures,  Focal weakness, Loss of consciousness,  Tremor, insomnia, depression, anxiety, and suicidal ideation.      Objective:  BP (!) 148/78   Pulse 63   Temp 98.1 F (36.7 C) (Oral)   Ht 5' 1.5" (1.562 m)   Wt 158 lb 6.4 oz (71.8 kg)   SpO2 98%   BMI 29.44 kg/m   BP Readings from Last 3 Encounters:  12/20/22 (!) 148/78  10/06/22 (!) 144/70  05/26/22 (!) 182/82    Wt Readings from Last 3 Encounters:  12/20/22 158 lb 6.4 oz (71.8 kg)  10/06/22 157 lb (71.2 kg)  05/26/22 161 lb 9.6 oz (73.3 kg)    Physical Exam  Lab Results  Component Value Date   HGBA1C 5.7 07/15/2021   HGBA1C 5.7 02/07/2020   HGBA1C 5.9 06/26/2019    Lab Results  Component Value Date   CREATININE 1.29 (H) 06/02/2022   CREATININE 1.44 (H) 07/15/2021  CREATININE 1.22 (H) 12/19/2020    Lab Results  Component Value Date   WBC 7.3 06/02/2022   HGB 12.8 06/02/2022   HCT 38.4 06/02/2022   PLT 250.0 06/02/2022   GLUCOSE 79 06/02/2022   CHOL 180 07/15/2021   TRIG 82.0 07/15/2021   HDL 83.70 07/15/2021   LDLDIRECT 81.0 12/09/2016   LDLCALC 80 07/15/2021   ALT 21 06/02/2022   AST 18 06/02/2022   NA 142 06/02/2022   K 4.4 06/02/2022   CL 104 06/02/2022   CREATININE 1.29 (H) 06/02/2022   BUN 27 (H) 06/02/2022   CO2 28 06/02/2022   TSH 0.87 08/11/2020   HGBA1C 5.7 07/15/2021   MICROALBUR 5.2 (H) 07/15/2021    No results found.  Assessment & Plan:  .Essential  hypertension  Hyperlipidemia, unspecified hyperlipidemia type  Fatigue, unspecified type  Prediabetes     I provided 30 minutes of face-to-face time during this encounter reviewing patient's last visit with me, patient's  most recent visit with cardiology,  nephrology,  and neurology,  recent surgical and non surgical procedures, previous  labs and imaging studies, counseling on currently addressed issues,  and post visit ordering to diagnostics and therapeutics .   Follow-up: No follow-ups on file.   Crecencio Mc, MD

## 2022-12-20 NOTE — Assessment & Plan Note (Addendum)
With loss of executive function and  sundowning reported every evening by daughter Maryruth Hancock. She has refused to start   seroquel and refuses to relinquish financial control to her daughter.

## 2022-12-20 NOTE — Patient Instructions (Signed)
I will inject your knees on Wednesday.  Please be here by 9:30 AM  You can get your labs after the injections

## 2022-12-21 NOTE — Assessment & Plan Note (Signed)
Continue tylenol and tramadol for pain control.   Return for bilateral knee injections .  Handicapped application for parking space initiated and shower chair DME also written  previously,

## 2022-12-22 ENCOUNTER — Ambulatory Visit (INDEPENDENT_AMBULATORY_CARE_PROVIDER_SITE_OTHER): Payer: Medicare PPO | Admitting: Internal Medicine

## 2022-12-22 ENCOUNTER — Encounter: Payer: Self-pay | Admitting: Internal Medicine

## 2022-12-22 VITALS — BP 154/82 | HR 83 | Temp 98.0°F | Ht 61.5 in | Wt 156.0 lb

## 2022-12-22 DIAGNOSIS — E785 Hyperlipidemia, unspecified: Secondary | ICD-10-CM

## 2022-12-22 DIAGNOSIS — G8929 Other chronic pain: Secondary | ICD-10-CM

## 2022-12-22 DIAGNOSIS — I10 Essential (primary) hypertension: Secondary | ICD-10-CM

## 2022-12-22 DIAGNOSIS — M25561 Pain in right knee: Secondary | ICD-10-CM

## 2022-12-22 DIAGNOSIS — R5383 Other fatigue: Secondary | ICD-10-CM | POA: Diagnosis not present

## 2022-12-22 DIAGNOSIS — R7303 Prediabetes: Secondary | ICD-10-CM

## 2022-12-22 DIAGNOSIS — M25562 Pain in left knee: Secondary | ICD-10-CM | POA: Diagnosis not present

## 2022-12-22 LAB — CBC WITH DIFFERENTIAL/PLATELET
Basophils Absolute: 0 10*3/uL (ref 0.0–0.1)
Basophils Relative: 0.5 % (ref 0.0–3.0)
Eosinophils Absolute: 0.2 10*3/uL (ref 0.0–0.7)
Eosinophils Relative: 3.3 % (ref 0.0–5.0)
HCT: 37.4 % (ref 36.0–46.0)
Hemoglobin: 12.4 g/dL (ref 12.0–15.0)
Lymphocytes Relative: 32.9 % (ref 12.0–46.0)
Lymphs Abs: 1.7 10*3/uL (ref 0.7–4.0)
MCHC: 33.3 g/dL (ref 30.0–36.0)
MCV: 98 fl (ref 78.0–100.0)
Monocytes Absolute: 0.5 10*3/uL (ref 0.1–1.0)
Monocytes Relative: 10.4 % (ref 3.0–12.0)
Neutro Abs: 2.8 10*3/uL (ref 1.4–7.7)
Neutrophils Relative %: 52.9 % (ref 43.0–77.0)
Platelets: 248 10*3/uL (ref 150.0–400.0)
RBC: 3.81 Mil/uL — ABNORMAL LOW (ref 3.87–5.11)
RDW: 16.5 % — ABNORMAL HIGH (ref 11.5–15.5)
WBC: 5.2 10*3/uL (ref 4.0–10.5)

## 2022-12-22 LAB — LIPID PANEL
Cholesterol: 193 mg/dL (ref 0–200)
HDL: 83.4 mg/dL (ref >39.00)
LDL Cholesterol: 95 mg/dL (ref 0–99)
NonHDL: 109.94
Total CHOL/HDL Ratio: 2
Triglycerides: 73 mg/dL (ref 0.0–149.0)
VLDL: 14.6 mg/dL (ref 0.0–40.0)

## 2022-12-22 LAB — COMPREHENSIVE METABOLIC PANEL
ALT: 15 U/L (ref 0–35)
AST: 21 U/L (ref 0–37)
Albumin: 3.9 g/dL (ref 3.5–5.2)
Alkaline Phosphatase: 72 U/L (ref 39–117)
BUN: 20 mg/dL (ref 6–23)
CO2: 26 mEq/L (ref 19–32)
Calcium: 9 mg/dL (ref 8.4–10.5)
Chloride: 106 mEq/L (ref 96–112)
Creatinine, Ser: 1.11 mg/dL (ref 0.40–1.20)
GFR: 44.52 mL/min — ABNORMAL LOW (ref >60.00)
Glucose, Bld: 84 mg/dL (ref 70–99)
Potassium: 4.2 mEq/L (ref 3.5–5.1)
Sodium: 142 mEq/L (ref 135–145)
Total Bilirubin: 0.6 mg/dL (ref 0.2–1.2)
Total Protein: 6.9 g/dL (ref 6.0–8.3)

## 2022-12-22 LAB — LDL CHOLESTEROL, DIRECT: Direct LDL: 87 mg/dL

## 2022-12-22 MED ORDER — LIDOCAINE HCL 1 % IJ SOLN
4.0000 mL | Freq: Once | INTRAMUSCULAR | Status: AC
  Administered 2022-12-22: 4 mL

## 2022-12-22 MED ORDER — TRIAMCINOLONE ACETONIDE 40 MG/ML IJ SUSP
40.0000 mg | Freq: Once | INTRAMUSCULAR | Status: AC
  Administered 2022-12-22: 40 mg via INTRAMUSCULAR

## 2022-12-22 NOTE — Assessment & Plan Note (Signed)
After obtaining informed consent for  an I/A injection of the right  knee,  The right knee was cleaned with betadine and alcohol.  Topical anesthetic was sprayed on medial side of  the fight patella and 1 ml (40 mg)  Kenalog mixed with 4 ml 1% lidocaine was injected without difficulty into the bursa,  Patient tolerated the procedure without complications or bleeding.  Patient was advised to apply ice for 15 minutes every few hours for the first 24 hours and to avoid strenuous activity for up to 7 days     After obtaining informed consent for  an I/A injection of the left knee,  The right knee was cleaned with betadine and alcohol.  Topical anesthetic was sprayed on medial side of left patella and  1 ml  (40 mg ) Kenalog mixed with 4 ml 1% lidocaine was injected without difficulty into the bursa,  Patient tolerated the procedure without complications or bleeding.  Patient was advised to apply ice for 15 minutes every few hours for the first 24 hours and to avoid strenuous activity for up to 7 days

## 2022-12-22 NOTE — Progress Notes (Unsigned)
Subjective:  Patient ID: Tiffany Benjamin, female    DOB: [birth year]  Age: 89 y.o. MRN: [id number]  CC: The primary encounter diagnosis was Chronic pain of both knees. Diagnoses of Hyperlipidemia, unspecified hyperlipidemia type, Essential hypertension, Prediabetes, and Fatigue, unspecified type were also pertinent to this visit.   HPI Tiffany Benjamin presents for  Chief Complaint  Patient presents with   bilateral knee injections   Tiffany Benjamin has advanced DJD of both knees,  managed with tylenol  and periodic   I/A joint injections.  Patient has requested bilateral steroid injections for relief of pain not controlled with oral medications.   Her last injections were done in April 2023.   Informed consent for bilateral joint injection obtained after discussion of the risks of infection and bleeding were reviewed  .       Outpatient Medications Prior to Visit  Medication Sig Dispense Refill   amLODipine (NORVASC) 10 MG tablet TAKE 1 TABLET (10 MG TOTAL) BY MOUTH DAILY. IN THE MORNING FOR HYPERTENSION 90 tablet 1   aspirin 81 MG tablet Take 81 mg by mouth daily.     busPIRone (BUSPAR) 10 MG tablet TAKE 1 TABLET BY MOUTH THREE TIMES A DAY 270 tablet 1   cholecalciferol (VITAMIN D3) 25 MCG (1000 UNIT) tablet Take 1,000 Units by mouth daily.     Cyanocobalamin (VITAMIN B-12 PO) Take by mouth daily.     hydrochlorothiazide (HYDRODIURIL) 25 MG tablet Take 1 tablet (25 mg total) by mouth daily. In the morning for hypertension 90 tablet 1   metoprolol succinate (TOPROL-XL) 50 MG 24 hr tablet TAKE 1 TABLET (50 MG TOTAL) BY MOUTH AT BEDTIME. FOR HYPERTENSION 90 tablet 1   Multiple Vitamins-Minerals (CENTRUM SILVER PO) Take by mouth daily.     sertraline (ZOLOFT) 25 MG tablet TAKE 1 TABLET (25 MG TOTAL) BY MOUTH DAILY. 90 tablet 1   simvastatin (ZOCOR) 20 MG tablet TAKE 1 TABLET BY MOUTH EVERY DAY 90 tablet 1   telmisartan (MICARDIS) 40 MG tablet TAKE 1 TABLET BY MOUTH EVERYDAY AT BEDTIME 90 tablet 1    Facility-Administered Medications Prior to Visit  Medication Dose Route Frequency Provider Last Rate Last Admin   cloNIDine (CATAPRES) tablet 0.1 mg  0.1 mg Oral Once Crecencio Mc, MD        Review of Systems;  Patient denies headache, fevers, malaise, unintentional weight loss, skin rash, eye pain, sinus congestion and sinus pain, sore throat, dysphagia,  hemoptysis , cough, dyspnea, wheezing, chest pain, palpitations, orthopnea, edema, abdominal pain, nausea, melena, diarrhea, constipation, flank pain, dysuria, hematuria, urinary  Frequency, nocturia, numbness, tingling, seizures,  Focal weakness, Loss of consciousness,  Tremor, insomnia, depression, anxiety, and suicidal ideation.      Objective:  BP (!) 154/82   Pulse 83   Temp 98 F (36.7 C) (Oral)   Ht 5' 1.5" (1.562 m)   Wt 156 lb (70.8 kg)   SpO2 98%   BMI 29.00 kg/m   BP Readings from Last 3 Encounters:  12/22/22 (!) 154/82  12/20/22 (!) 148/78  10/06/22 (!) 144/70    Wt Readings from Last 3 Encounters:  12/22/22 156 lb (70.8 kg)  12/20/22 158 lb 6.4 oz (71.8 kg)  10/06/22 157 lb (71.2 kg)    Physical Exam  Not done   Lab Results  Component Value Date   HGBA1C 5.7 07/15/2021   HGBA1C 5.7 02/07/2020   HGBA1C 5.9 06/26/2019    Lab Results  Component Value Date  CREATININE 1.29 (H) 06/02/2022   CREATININE 1.44 (H) 07/15/2021   CREATININE 1.22 (H) 12/19/2020    Lab Results  Component Value Date   WBC 7.3 06/02/2022   HGB 12.8 06/02/2022   HCT 38.4 06/02/2022   PLT 250.0 06/02/2022   GLUCOSE 79 06/02/2022   CHOL 180 07/15/2021   TRIG 82.0 07/15/2021   HDL 83.70 07/15/2021   LDLDIRECT 81.0 12/09/2016   LDLCALC 80 07/15/2021   ALT 21 06/02/2022   AST 18 06/02/2022   NA 142 06/02/2022   K 4.4 06/02/2022   CL 104 06/02/2022   CREATININE 1.29 (H) 06/02/2022   BUN 27 (H) 06/02/2022   CO2 28 06/02/2022   TSH 0.87 08/11/2020   HGBA1C 5.7 07/15/2021   MICROALBUR 5.2 (H) 07/15/2021    No  results found.  Assessment & Plan:  .Chronic pain of both knees Assessment & Plan: After obtaining informed consent for  an I/A injection of the right  knee,  The right knee was cleaned with betadine and alcohol.  Topical anesthetic was sprayed on medial side of  the fight patella and 1 ml (40 mg)  Kenalog mixed with 4 ml 1% lidocaine was injected without difficulty into the bursa,  Patient tolerated the procedure without complications or bleeding.  Patient was advised to apply ice for 15 minutes every few hours for the first 24 hours and to avoid strenuous activity for up to 7 days     After obtaining informed consent for  an I/A injection of the left knee,  The right knee was cleaned with betadine and alcohol.  Topical anesthetic was sprayed on medial side of left patella and  1 ml  (40 mg ) Kenalog mixed with 4 ml 1% lidocaine was injected without difficulty into the bursa,  Patient tolerated the procedure without complications or bleeding.  Patient was advised to apply ice for 15 minutes every few hours for the first 24 hours and to avoid strenuous activity for up to 7 days      Hyperlipidemia, unspecified hyperlipidemia type -     Lipid panel -     LDL cholesterol, direct  Essential hypertension -     Comprehensive metabolic panel  Prediabetes -     Comprehensive metabolic panel  Fatigue, unspecified type -     CBC with Differential/Platelet     I provided 30 minutes of face-to-face time during this encounter reviewing patient's last visit with me, patient's  most recent visit with cardiology,  nephrology,  and neurology,  recent surgical and non surgical procedures, previous  labs and imaging studies, counseling on currently addressed issues,  and post visit ordering to diagnostics and therapeutics .   Follow-up: Return in about 3 months (around 03/24/2023).   Crecencio Mc, MD

## 2022-12-25 ENCOUNTER — Other Ambulatory Visit: Payer: Self-pay | Admitting: Internal Medicine

## 2022-12-27 ENCOUNTER — Other Ambulatory Visit: Payer: Self-pay | Admitting: Internal Medicine

## 2022-12-27 MED ORDER — BUSPIRONE HCL 15 MG PO TABS: 15.0000 mg | ORAL_TABLET | Freq: Three times a day (TID) | ORAL | 1 refills | Status: DC

## 2022-12-28 ENCOUNTER — Other Ambulatory Visit: Payer: Self-pay | Admitting: Internal Medicine

## 2023-03-10 DIAGNOSIS — R059 Cough, unspecified: Secondary | ICD-10-CM | POA: Diagnosis not present

## 2023-03-10 DIAGNOSIS — U071 COVID-19: Secondary | ICD-10-CM | POA: Diagnosis not present

## 2023-03-10 DIAGNOSIS — R509 Fever, unspecified: Secondary | ICD-10-CM | POA: Diagnosis not present

## 2023-03-23 ENCOUNTER — Ambulatory Visit: Payer: Medicare PPO | Admitting: Internal Medicine

## 2023-03-24 ENCOUNTER — Other Ambulatory Visit: Payer: Self-pay | Admitting: Internal Medicine

## 2023-05-11 ENCOUNTER — Ambulatory Visit (INDEPENDENT_AMBULATORY_CARE_PROVIDER_SITE_OTHER): Payer: Medicare PPO | Admitting: Internal Medicine

## 2023-05-11 ENCOUNTER — Encounter: Payer: Self-pay | Admitting: Internal Medicine

## 2023-05-11 VITALS — BP 138/78 | HR 56 | Ht 61.5 in | Wt 151.8 lb

## 2023-05-11 DIAGNOSIS — N1832 Chronic kidney disease, stage 3b: Secondary | ICD-10-CM

## 2023-05-11 DIAGNOSIS — M25562 Pain in left knee: Secondary | ICD-10-CM | POA: Diagnosis not present

## 2023-05-11 DIAGNOSIS — G8929 Other chronic pain: Secondary | ICD-10-CM | POA: Diagnosis not present

## 2023-05-11 DIAGNOSIS — M170 Bilateral primary osteoarthritis of knee: Secondary | ICD-10-CM

## 2023-05-11 DIAGNOSIS — I10 Essential (primary) hypertension: Secondary | ICD-10-CM | POA: Diagnosis not present

## 2023-05-11 DIAGNOSIS — M25561 Pain in right knee: Secondary | ICD-10-CM

## 2023-05-11 DIAGNOSIS — F01A11 Vascular dementia, mild, with agitation: Secondary | ICD-10-CM | POA: Diagnosis not present

## 2023-05-11 MED ORDER — HYDROCHLOROTHIAZIDE 25 MG PO TABS: 25.0000 mg | ORAL_TABLET | Freq: Every day | ORAL | 1 refills | Status: DC

## 2023-05-11 MED ORDER — TRIAMCINOLONE ACETONIDE 40 MG/ML IJ SUSP
40.0000 mg | Freq: Once | INTRAMUSCULAR | Status: AC
Start: 2023-05-11 — End: 2023-05-11
  Administered 2023-05-11: 40 mg

## 2023-05-11 MED ORDER — SIMVASTATIN 20 MG PO TABS: 20.0000 mg | ORAL_TABLET | Freq: Every day | ORAL | 1 refills | Status: DC

## 2023-05-11 MED ORDER — TELMISARTAN 40 MG PO TABS: ORAL_TABLET | ORAL | 1 refills | Status: DC

## 2023-05-11 MED ORDER — LIDOCAINE HCL 1 % IJ SOLN
4.0000 mL | Freq: Once | INTRAMUSCULAR | Status: AC
Start: 2023-05-11 — End: 2023-05-11
  Administered 2023-05-11: 4 mL

## 2023-05-11 MED ORDER — SERTRALINE HCL 25 MG PO TABS: 25.0000 mg | ORAL_TABLET | Freq: Every day | ORAL | 1 refills | Status: DC

## 2023-05-11 MED ORDER — METOPROLOL SUCCINATE ER 50 MG PO TB24: 50.0000 mg | ORAL_TABLET | Freq: Every day | ORAL | 1 refills | Status: DC

## 2023-05-11 MED ORDER — BUSPIRONE HCL 15 MG PO TABS: 15.0000 mg | ORAL_TABLET | Freq: Three times a day (TID) | ORAL | 1 refills | Status: DC

## 2023-05-11 NOTE — Assessment & Plan Note (Signed)
Currently Well controlled  for age on current regimen. Renal function has been stable, no changes today.

## 2023-05-11 NOTE — Assessment & Plan Note (Addendum)
After obtaining informed consent for  an I/A injection of the right  knee,  The right knee was cleaned with betadine and alcohol.  Topical anesthetic was sprayed on medial side of  the fight patella and 1 ml (40 mg)  Kenalog mixed with 4 ml 1% lidocaine was injected without difficulty into the bursa,  Patient tolerated the procedure without complications or bleeding.  Patient was advised to apply ice for 15 minutes every few hours for the first 24 hours and to avoid strenuous activity for up to 7 days     After obtaining informed consent for  an I/A injection of the left knee,  The right knee was cleaned with betadine and alcohol.  Topical anesthetic was sprayed on medial side of left patella and  1 ml  (40 mg ) Kenalog mixed with 4 ml 1% lidocaine was injected without difficulty into the bursa,  Patient tolerated the procedure without complications or bleeding.  Patient was advised to apply ice for 15 minutes every few hours for the first 24 hours and to avoid strenuous activity for up to 7 days   Managed with tylenol, tramadol and periodic I/A injections,  last one April 2024.

## 2023-05-11 NOTE — Progress Notes (Signed)
Subjective:  Patient ID: Tiffany Benjamin, female    DOB: August 13, 1933  Age: 89 y.o. MRN: [id number]  CC: The primary encounter diagnosis was Chronic pain of both knees. Diagnoses of Chronic kidney disease, stage 3b (HCC), Tricompartment osteoarthritis of both knees, Mild vascular dementia with agitation (HCC), and Elevated blood pressure reading in office with white coat syndrome, with diagnosis of hypertension were also pertinent to this visit.   HPI Tiffany Benjamin presents for bilateral knee injections .     1) Knee pain :  She has severe bilateral  DJD managed with tylenol,  prn tramadol and periodic steroid injections.   She has not had any skin infections , redness or warmth to either knee, and is having increased pain .  She had months of relief after her last injection in April 2024.    2) HTN:  Hypertension: patient's daughter  checks blood pressure once weekly at home.  Readings have been for the most part <140/90 at rest . Patient is following a reduced salt diet most days and is taking medications as prescribed   3) Dementia:  Tiffany Benjamin continues to live supervised closely by her daughter Tiffany Benjamin.  She attends a [employer] during the day . She has had agitation and mild paranoia but refuses medication (quetiapine)  Outpatient Medications Prior to Visit  Medication Sig Dispense Refill   amLODipine (NORVASC) 10 MG tablet TAKE 1 TABLET (10 MG TOTAL) BY MOUTH DAILY. IN THE MORNING FOR HYPERTENSION 90 tablet 1   aspirin 81 MG tablet Take 81 mg by mouth daily.     cholecalciferol (VITAMIN D3) 25 MCG (1000 UNIT) tablet Take 1,000 Units by mouth daily.     Cyanocobalamin (VITAMIN B-12 PO) Take by mouth daily.     Multiple Vitamins-Minerals (CENTRUM SILVER PO) Take by mouth daily.     busPIRone (BUSPAR) 15 MG tablet Take 1 tablet (15 mg total) by mouth 3 (three) times daily. 270 tablet 1   hydrochlorothiazide (HYDRODIURIL) 25 MG tablet TAKE 1 TABLET  (25 MG TOTAL) BY MOUTH DAILY. IN THE MORNING FOR HYPERTENSION 90 tablet 1   metoprolol succinate (TOPROL-XL) 50 MG 24 hr tablet TAKE 1 TABLET (50 MG TOTAL) BY MOUTH AT BEDTIME. FOR HYPERTENSION 90 tablet 1   sertraline (ZOLOFT) 25 MG tablet TAKE 1 TABLET (25 MG TOTAL) BY MOUTH DAILY. 90 tablet 1   simvastatin (ZOCOR) 20 MG tablet TAKE 1 TABLET BY MOUTH EVERY DAY 90 tablet 1   telmisartan (MICARDIS) 40 MG tablet TAKE 1 TABLET BY MOUTH EVERYDAY AT BEDTIME 90 tablet 1   Facility-Administered Medications Prior to Visit  Medication Dose Route Frequency Provider Last Rate Last Admin   cloNIDine (CATAPRES) tablet 0.1 mg  0.1 mg Oral Once Sherlene Shams, MD        Review of Systems;  Patient denies headache, fevers, malaise, unintentional weight loss, skin rash, eye pain, sinus congestion and sinus pain, sore throat, dysphagia,  hemoptysis , cough, dyspnea, wheezing, chest pain, palpitations, orthopnea, edema, abdominal pain, nausea, melena, diarrhea, constipation, flank pain, dysuria, hematuria, urinary  Frequency, nocturia, numbness, tingling, seizures,  Focal weakness, Loss of consciousness,  Tremor, insomnia, depression, anxiety, and suicidal ideation.      Objective:  BP 138/78   Pulse (!) 56   Ht 5' 1.5" (1.562 m)   Wt 151 lb 12.8 oz (68.9 kg)   SpO2 97%   BMI 28.22 kg/m   BP Readings from Last 3  Encounters:  05/11/23 138/78  12/22/22 (!) 154/82  12/20/22 (!) 148/78    Wt Readings from Last 3 Encounters:  05/11/23 151 lb 12.8 oz (68.9 kg)  12/22/22 156 lb (70.8 kg)  12/20/22 158 lb 6.4 oz (71.8 kg)    Physical Exam Vitals reviewed.  Constitutional:      General: She is not in acute distress.    Appearance: Normal appearance. She is normal weight. She is not ill-appearing, toxic-appearing or diaphoretic.  HENT:     Head: Normocephalic.  Eyes:     General: No scleral icterus.       Right eye: No discharge.        Left eye: No discharge.     Conjunctiva/sclera:  Conjunctivae normal.  Cardiovascular:     Rate and Rhythm: Normal rate and regular rhythm.     Heart sounds: Normal heart sounds.  Pulmonary:     Effort: Pulmonary effort is normal. No respiratory distress.     Breath sounds: Normal breath sounds.  Musculoskeletal:        General: Normal range of motion.  Skin:    General: Skin is warm and dry.  Neurological:     General: No focal deficit present.     Mental Status: She is alert and oriented to person, place, and time. Mental status is at baseline.  Psychiatric:        Mood and Affect: Mood normal.        Behavior: Behavior normal.        Thought Content: Thought content normal.        Judgment: Judgment normal.    Lab Results  Component Value Date   HGBA1C 5.7 07/15/2021   HGBA1C 5.7 02/07/2020   HGBA1C 5.9 06/26/2019    Lab Results  Component Value Date   CREATININE 1.11 12/22/2022   CREATININE 1.29 (H) 06/02/2022   CREATININE 1.44 (H) 07/15/2021    Lab Results  Component Value Date   WBC 5.2 12/22/2022   HGB 12.4 12/22/2022   HCT 37.4 12/22/2022   PLT 248.0 12/22/2022   GLUCOSE 84 12/22/2022   CHOL 193 12/22/2022   TRIG 73.0 12/22/2022   HDL 83.40 12/22/2022   LDLDIRECT 87.0 12/22/2022   LDLCALC 95 12/22/2022   ALT 15 12/22/2022   AST 21 12/22/2022   NA 142 12/22/2022   K 4.2 12/22/2022   CL 106 12/22/2022   CREATININE 1.11 12/22/2022   BUN 20 12/22/2022   CO2 26 12/22/2022   TSH 0.87 08/11/2020   HGBA1C 5.7 07/15/2021   MICROALBUR 5.2 (H) 07/15/2021    No results found.  Assessment & Plan:  .Chronic pain of both knees Assessment & Plan: After obtaining informed consent for  an I/A injection of the right  knee,  The right knee was cleaned with betadine and alcohol.  Topical anesthetic was sprayed on medial side of  the fight patella and 1 ml (40 mg)  Kenalog mixed with 4 ml 1% lidocaine was injected without difficulty into the bursa,  Patient tolerated the procedure without complications or  bleeding.  Patient was advised to apply ice for 15 minutes every few hours for the first 24 hours and to avoid strenuous activity for up to 7 days     After obtaining informed consent for  an I/A injection of the left knee,  The right knee was cleaned with betadine and alcohol.  Topical anesthetic was sprayed on medial side of left patella and  1 ml  (40  mg ) Kenalog mixed with 4 ml 1% lidocaine was injected without difficulty into the bursa,  Patient tolerated the procedure without complications or bleeding.  Patient was advised to apply ice for 15 minutes every few hours for the first 24 hours and to avoid strenuous activity for up to 7 days   Managed with tylenol, tramadol and periodic I/A injections,  last one April 2024.    Orders: -     Triamcinolone Acetonide -     Triamcinolone Acetonide -     Lidocaine HCl -     Lidocaine HCl  Chronic kidney disease, stage 3b (HCC) Assessment & Plan: Renal function is at  baseline with avoidance of NSAIDs.  She is on an ARB for control of hypertension,, and statin for control of hyperlipidemia.    Lab Results  Component Value Date   CREATININE 1.11 12/22/2022   Lab Results  Component Value Date   NA 142 12/22/2022   K 4.2 12/22/2022   CL 106 12/22/2022   CO2 26 12/22/2022      Tricompartment osteoarthritis of both knees Assessment & Plan: Continue tylenol and tramadol for pain control.   Today she received  bilateral knee  steroid injections .  Handicapped application for parking space initiated and shower chair DME also written  previously,    Mild vascular dementia with agitation [facility]) Assessment & Plan: With loss of executive function and  sundowning reported every evening by daughter Tiffany Benjamin. She has refused to start   seroquel.  She is taking buspar and aattending adult daycare.    Patient  now lacks competency to manage her financial affairs due to progressive memory loss leading to poor insight about management of  financial  resources.  Will advise daughter to petition for guardianship    Elevated blood pressure reading in office with white coat syndrome, with diagnosis of hypertension Assessment & Plan: Currently Well controlled  for age on current regimen. Renal function has been stable, no changes today.    Other orders -     busPIRone HCl; Take 1 tablet (15 mg total) by mouth 3 (three) times daily.  Dispense: 270 tablet; Refill: 1 -     hydroCHLOROthiazide; Take 1 tablet (25 mg total) by mouth daily. In the morning for hypertension  Dispense: 90 tablet; Refill: 1 -     Metoprolol Succinate ER; Take 1 tablet (50 mg total) by mouth at bedtime. FOR HYPERTENSION  Dispense: 90 tablet; Refill: 1 -     Sertraline HCl; Take 1 tablet (25 mg total) by mouth daily.  Dispense: 90 tablet; Refill: 1 -     Simvastatin; Take 1 tablet (20 mg total) by mouth daily.  Dispense: 90 tablet; Refill: 1 -     Telmisartan; TAKE 1 TABLET BY MOUTH EVERYDAY AT BEDTIME  Dispense: 90 tablet; Refill: 1     Follow-up: Return in about 3 months (around 08/11/2023) for hypertension.   Sherlene Shams, MD

## 2023-05-11 NOTE — Assessment & Plan Note (Signed)
With loss of executive function and  sundowning reported every evening by daughter Tiffany Benjamin. She has refused to start   seroquel.  She is taking buspar and aattending adult daycare.    Patient  now lacks competency to manage her financial affairs due to progressive memory loss leading to poor insight about management of  financial resources.  Will advise daughter to petition for guardianship

## 2023-05-11 NOTE — Assessment & Plan Note (Signed)
Continue tylenol and tramadol for pain control.   Today she received  bilateral knee  steroid injections .  Handicapped application for parking space initiated and shower chair DME also written  previously,

## 2023-05-11 NOTE — Assessment & Plan Note (Signed)
Renal function is at  baseline with avoidance of NSAIDs.  She is on an ARB for control of hypertension,, and statin for control of hyperlipidemia.    Lab Results  Component Value Date   CREATININE 1.11 12/22/2022   Lab Results  Component Value Date   NA 142 12/22/2022   K 4.2 12/22/2022   CL 106 12/22/2022   CO2 26 12/22/2022

## 2023-05-25 ENCOUNTER — Telehealth: Payer: Self-pay | Admitting: Internal Medicine

## 2023-05-25 DIAGNOSIS — F01A11 Vascular dementia, mild, with agitation: Secondary | ICD-10-CM

## 2023-05-25 NOTE — Telephone Encounter (Signed)
Patient's daughter just called and asked can Dr. Darrick Huntsman make an appointment for her mom to see a neurologist. Her number is [phone].

## 2023-05-25 NOTE — Telephone Encounter (Signed)
Spoke with pt's daughter and she stated that pt's dementia is worsening. She stated that pt has started acting out at home, being accusatory, not remembering what she just said to someone. Daughter would like to schedule an appointment with you and pt to discuss next steps and see about getting a referral to neurology and maybe some home health. There is no appts available any time soon. Would it be okay to add pt to a Tuesday morning and Thursday afternoon. Daughter states that she has off work on 06/08/2023.

## 2023-05-26 NOTE — Telephone Encounter (Signed)
Spoke with pt's daughter and scheduled pt for 06/08/2023 at 9am.

## 2023-06-08 ENCOUNTER — Ambulatory Visit (INDEPENDENT_AMBULATORY_CARE_PROVIDER_SITE_OTHER): Payer: Medicare PPO | Admitting: Internal Medicine

## 2023-06-08 ENCOUNTER — Encounter: Payer: Self-pay | Admitting: Internal Medicine

## 2023-06-08 VITALS — BP 152/80 | HR 59 | Temp 98.4°F | Ht 61.5 in | Wt 149.8 lb

## 2023-06-08 DIAGNOSIS — E6609 Other obesity due to excess calories: Secondary | ICD-10-CM | POA: Diagnosis not present

## 2023-06-08 DIAGNOSIS — I10 Essential (primary) hypertension: Secondary | ICD-10-CM | POA: Diagnosis not present

## 2023-06-08 DIAGNOSIS — F01A11 Vascular dementia, mild, with agitation: Secondary | ICD-10-CM | POA: Diagnosis not present

## 2023-06-08 DIAGNOSIS — Z6832 Body mass index (BMI) 32.0-32.9, adult: Secondary | ICD-10-CM

## 2023-06-08 DIAGNOSIS — R5383 Other fatigue: Secondary | ICD-10-CM | POA: Diagnosis not present

## 2023-06-08 DIAGNOSIS — E785 Hyperlipidemia, unspecified: Secondary | ICD-10-CM

## 2023-06-08 DIAGNOSIS — R7301 Impaired fasting glucose: Secondary | ICD-10-CM | POA: Diagnosis not present

## 2023-06-08 DIAGNOSIS — N1832 Chronic kidney disease, stage 3b: Secondary | ICD-10-CM | POA: Diagnosis not present

## 2023-06-08 LAB — COMPREHENSIVE METABOLIC PANEL
ALT: 13 U/L (ref 0–35)
AST: 17 U/L (ref 0–37)
Albumin: 3.9 g/dL (ref 3.5–5.2)
Alkaline Phosphatase: 67 U/L (ref 39–117)
BUN: 21 mg/dL (ref 6–23)
CO2: 30 meq/L (ref 19–32)
Calcium: 9.3 mg/dL (ref 8.4–10.5)
Chloride: 104 meq/L (ref 96–112)
Creatinine, Ser: 1.24 mg/dL — ABNORMAL HIGH (ref 0.40–1.20)
GFR: 38.85 mL/min — ABNORMAL LOW (ref >60.00)
Glucose, Bld: 81 mg/dL (ref 70–99)
Potassium: 3.7 meq/L (ref 3.5–5.1)
Sodium: 143 meq/L (ref 135–145)
Total Bilirubin: 0.6 mg/dL (ref 0.2–1.2)
Total Protein: 7.2 g/dL (ref 6.0–8.3)

## 2023-06-08 LAB — TSH: TSH: 0.84 u[IU]/mL (ref 0.35–5.50)

## 2023-06-08 LAB — LIPID PANEL
Cholesterol: 189 mg/dL (ref 0–200)
HDL: 77.2 mg/dL (ref >39.00)
LDL Cholesterol: 95 mg/dL (ref 0–99)
NonHDL: 111.58
Total CHOL/HDL Ratio: 2
Triglycerides: 83 mg/dL (ref 0.0–149.0)
VLDL: 16.6 mg/dL (ref 0.0–40.0)

## 2023-06-08 LAB — MICROALBUMIN / CREATININE URINE RATIO
Creatinine,U: 89.4 mg/dL
Microalb Creat Ratio: 7.2 mg/g (ref 0.0–30.0)
Microalb, Ur: 6.4 mg/dL — ABNORMAL HIGH (ref 0.0–1.9)

## 2023-06-08 LAB — HEMOGLOBIN A1C: Hgb A1c MFr Bld: 5.6 % (ref 4.6–6.5)

## 2023-06-08 LAB — LDL CHOLESTEROL, DIRECT: Direct LDL: 107 mg/dL

## 2023-06-08 MED ORDER — AMLODIPINE BESYLATE 10 MG PO TABS: 10.0000 mg | ORAL_TABLET | Freq: Every day | ORAL | 1 refills | Status: DC

## 2023-06-08 NOTE — Assessment & Plan Note (Signed)
Renal function is at  baseline with avoidance of NSAIDs.  She is on an ARB for control of hypertension,, and statin for control of hyperlipidemia.    Lab Results  Component Value Date   CREATININE 1.24 (H) 06/08/2023   Lab Results  Component Value Date   NA 143 06/08/2023   K 3.7 06/08/2023   CL 104 06/08/2023   CO2 30 06/08/2023

## 2023-06-08 NOTE — Progress Notes (Signed)
Subjective:  Patient ID: Tiffany Benjamin, female    DOB: [birth year]  Age: 89 y.o. MRN: [id number]  CC: The primary encounter diagnosis was Hyperlipidemia, unspecified hyperlipidemia type. Diagnoses of Class 1 obesity due to excess calories without serious comorbidity with body mass index (BMI) of 32.0 to 32.9 in adult, Fatigue, unspecified type, Mild vascular dementia with agitation (HCC), Essential hypertension, Impaired fasting glucose, and Chronic kidney disease, stage 3b (HCC) were also pertinent to this visit.   HPI Tiffany Benjamin presents for  Chief Complaint  Patient presents with   Medical Management of Chronic Issues   Accompanied by daughter  Nicole Cella  1) HTN: Patient is taking her medications as prescribed and notes no adverse effects.  Home BP readings have been done about once per week and are  generally < 130/80 .  She is avoiding added salt in her diet and walking regularly about 3 times per week for exercise  .   2) Vascular dementia with paranoid features:  using a pill box to manage her medications.  Nicole Cella now managing  all of her  financial issues,  has power of attorney,   Memory aggravated by sister Dot who moved in last December  who is bossy ., has an excellent memory,  and  more savvy about "scam " calls     Outpatient Medications Prior to Visit  Medication Sig Dispense Refill   aspirin 81 MG tablet Take 81 mg by mouth daily.     busPIRone (BUSPAR) 15 MG tablet Take 1 tablet (15 mg total) by mouth 3 (three) times daily. 270 tablet 1   cholecalciferol (VITAMIN D3) 25 MCG (1000 UNIT) tablet Take 1,000 Units by mouth daily.     Cyanocobalamin (VITAMIN B-12 PO) Take by mouth daily.     hydrochlorothiazide (HYDRODIURIL) 25 MG tablet Take 1 tablet (25 mg total) by mouth daily. In the morning for hypertension 90 tablet 1   metoprolol succinate (TOPROL-XL) 50 MG 24 hr tablet Take 1 tablet (50 mg total) by mouth at bedtime. FOR HYPERTENSION 90 tablet 1   Multiple  Vitamins-Minerals (CENTRUM SILVER PO) Take by mouth daily.     sertraline (ZOLOFT) 25 MG tablet Take 1 tablet (25 mg total) by mouth daily. 90 tablet 1   simvastatin (ZOCOR) 20 MG tablet Take 1 tablet (20 mg total) by mouth daily. 90 tablet 1   telmisartan (MICARDIS) 40 MG tablet TAKE 1 TABLET BY MOUTH EVERYDAY AT BEDTIME 90 tablet 1   amLODipine (NORVASC) 10 MG tablet TAKE 1 TABLET (10 MG TOTAL) BY MOUTH DAILY. IN THE MORNING FOR HYPERTENSION 90 tablet 1   cloNIDine (CATAPRES) tablet 0.1 mg      No facility-administered medications prior to visit.    Review of Systems;  Patient denies headache, fevers, malaise, unintentional weight loss, skin rash, eye pain, sinus congestion and sinus pain, sore throat, dysphagia,  hemoptysis , cough, dyspnea, wheezing, chest pain, palpitations, orthopnea, edema, abdominal pain, nausea, melena, diarrhea, constipation, flank pain, dysuria, hematuria, urinary  Frequency, nocturia, numbness, tingling, seizures,  Focal weakness, Loss of consciousness,  Tremor, insomnia, depression, anxiety, and suicidal ideation.      Objective:  BP (!) 152/80   Pulse (!) 59   Temp 98.4 F (36.9 C) (Oral)   Ht 5' 1.5" (1.562 m)   Wt 149 lb 12.8 oz (67.9 kg)   SpO2 99%   BMI 27.85 kg/m   BP Readings from Last 3 Encounters:  06/08/23 (!) 152/80  05/11/23 138/78  12/22/22 (!) 154/82    Wt Readings from Last 3 Encounters:  06/08/23 149 lb 12.8 oz (67.9 kg)  05/11/23 151 lb 12.8 oz (68.9 kg)  12/22/22 156 lb (70.8 kg)    Physical Exam Vitals reviewed.  Constitutional:      General: She is not in acute distress.    Appearance: Normal appearance. She is normal weight. She is not ill-appearing, toxic-appearing or diaphoretic.  HENT:     Head: Normocephalic.  Eyes:     General: No scleral icterus.       Right eye: No discharge.        Left eye: No discharge.     Conjunctiva/sclera: Conjunctivae normal.  Cardiovascular:     Rate and Rhythm: Normal rate and  regular rhythm.     Heart sounds: Normal heart sounds.  Pulmonary:     Effort: Pulmonary effort is normal. No respiratory distress.     Breath sounds: Normal breath sounds.  Musculoskeletal:        General: Normal range of motion.  Skin:    General: Skin is warm and dry.  Neurological:     General: No focal deficit present.     Mental Status: She is alert and oriented to person, place, and time. Mental status is at baseline.  Psychiatric:        Mood and Affect: Mood normal.        Behavior: Behavior normal.        Thought Content: Thought content normal.        Judgment: Judgment normal.    Lab Results  Component Value Date   HGBA1C 5.6 06/08/2023   HGBA1C 5.7 07/15/2021   HGBA1C 5.7 02/07/2020    Lab Results  Component Value Date   CREATININE 1.24 (H) 06/08/2023   CREATININE 1.11 12/22/2022   CREATININE 1.29 (H) 06/02/2022    Lab Results  Component Value Date   WBC 5.2 12/22/2022   HGB 12.4 12/22/2022   HCT 37.4 12/22/2022   PLT 248.0 12/22/2022   GLUCOSE 81 06/08/2023   CHOL 189 06/08/2023   TRIG 83.0 06/08/2023   HDL 77.20 06/08/2023   LDLDIRECT 107.0 06/08/2023   LDLCALC 95 06/08/2023   ALT 13 06/08/2023   AST 17 06/08/2023   NA 143 06/08/2023   K 3.7 06/08/2023   CL 104 06/08/2023   CREATININE 1.24 (H) 06/08/2023   BUN 21 06/08/2023   CO2 30 06/08/2023   TSH 0.84 06/08/2023   HGBA1C 5.6 06/08/2023   MICROALBUR 6.4 (H) 06/08/2023    No results found.  Assessment & Plan:  .Hyperlipidemia, unspecified hyperlipidemia type -     Lipid panel -     LDL cholesterol, direct  Class 1 obesity due to excess calories without serious comorbidity with body mass index (BMI) of 32.0 to 32.9 in adult -     Comprehensive metabolic panel  Fatigue, unspecified type -     TSH  Mild vascular dementia with agitation (HCC) Assessment & Plan: With loss of executive function and  sundowning reported every evening by daughter Grier Rocher. She has refused to start    seroquel.  She is taking buspar and aattending adult daycare.    Patient  now lacks competency to manage her financial affairs due to progressive memory loss leading to poor insight about management of  financial resources.  Dorothy  her  daughter has obtained power of attorney. Marland Kitchen  Referring to neurology for management   Orders: -  Ambulatory referral to Neurology  Essential hypertension -     Comprehensive metabolic panel -     Microalbumin / creatinine urine ratio  Impaired fasting glucose -     Hemoglobin A1c  Chronic kidney disease, stage 3b (HCC) Assessment & Plan: Renal function is at  baseline with avoidance of NSAIDs.  She is on an ARB for control of hypertension,, and statin for control of hyperlipidemia.    Lab Results  Component Value Date   CREATININE 1.24 (H) 06/08/2023   Lab Results  Component Value Date   NA 143 06/08/2023   K 3.7 06/08/2023   CL 104 06/08/2023   CO2 30 06/08/2023      Other orders -     amLODIPine Besylate; Take 1 tablet (10 mg total) by mouth daily. IN THE MORNING FOR HYPERTENSION  Dispense: 90 tablet; Refill: 1     Follow-up: Return in about 3 months (around 09/07/2023).   Sherlene Shams, MD

## 2023-06-08 NOTE — Assessment & Plan Note (Signed)
With loss of executive function and  sundowning reported every evening by daughter Tiffany Benjamin. She has refused to start   seroquel.  She is taking buspar and aattending adult daycare.    Patient  now lacks competency to manage her financial affairs due to progressive memory loss leading to poor insight about management of  financial resources.  Tiffany Benjamin  her  daughter has obtained power of attorney. Marland Kitchen  Referring to neurology for management

## 2023-06-08 NOTE — Patient Instructions (Signed)
I have made a referral to [facility] neurology to evaluate your memory

## 2023-06-09 ENCOUNTER — Encounter: Payer: Self-pay | Admitting: Physician Assistant

## 2023-07-02 NOTE — Progress Notes (Incomplete)
Assessment/Plan:     Tiffany Benjamin is a very pleasant 89 y.o. year old RH female with a history of hypertension, hyperlipidemia, impaired fasting glucose, arthritis, \ CKD 3B, seen today for evaluation of memory loss. MoCA today is  /30***.  Workup is in progress, but findings are suspicious for dementia*** Patient needs assistance with some ADLs.  Patient no longer drives.  Memory impairment with agitation  MRI brain without contrast to assess for underlying structural abnormality and assess vascular load  Check B12, TSH Recommend good control of cardiovascular risk factors.   Continue to control mood as per PCP Folllow up in    Subjective:    The patient is accompanied by ***  who supplements the history.    How long did patient have memory difficulties?  For the last***.   Her sister has moved since last December which allowed more stress and friction, with appears to have affected the patient's memory.  Patient has difficulty remembering new information, recent conversations and names of people.  She obtained daycare which is good for social and cognitive stimulation. repeats oneself?  Endorsed Disoriented when walking into a room?  Denies except occasionally not remembering what patient came to the room for ***  Leaving objects in unusual places?   Denies.  Wandering behavior? Denies.   Any personality changes, or depression, anxiety?  Has moments of irritability.*** Hallucinations or paranoia?  Denies.   Seizures? Denies.    Any sleep changes?  Sleeps well***does not sleep well***denies vivid dreams, REM behavior or sleepwalking.   Sleep apnea? Denies.   Any hygiene concerns?  Denies.   Independent of bathing and dressing?  Needs assistance getting dressed.*** Who is in charge of the medications?  Patient is in charge, uses a pillbox*** Who is in charge of the finances?  Daughter is in charge   *** Any changes in appetite?   Denies. ***   Patient have trouble  swallowing?  Denies.   Does the patient cook?  No***  Any headaches?  Denies.   Chronic back pain?  Denies.   Ambulates with difficulty? Needs a walker to ambulate ***   Recent falls or head injuries? Denies.     Vision changes? Denies. Stroke like symptoms?  Denies.   Any tremors?  Denies.   Any anosmia?  Denies.   Any incontinence of urine? Denies.   Any bowel dysfunction? Denies.      Patient lives  ***  History of heavy alcohol intake? Denies.   History of heavy tobacco use? Denies.   Family history of dementia?   ***Denies. Does patient drive?***No longer drives Pertinent labs September 2024 TSH 0.84 or, CMET unremarkable, lipid profile No Known Allergies  Current Outpatient Medications  Medication Instructions   amLODipine (NORVASC) 10 mg, Oral, Daily, IN THE MORNING FOR HYPERTENSION   aspirin 81 mg, Daily   busPIRone (BUSPAR) 15 mg, Oral, 3 times daily   cholecalciferol (VITAMIN D3) 1,000 Units, Oral, Daily   Cyanocobalamin (VITAMIN B-12 PO) Oral, Daily   hydrochlorothiazide (HYDRODIURIL) 25 mg, Oral, Daily, In the morning for hypertension   metoprolol succinate (TOPROL-XL) 50 mg, Oral, Daily at bedtime, FOR HYPERTENSION   Multiple Vitamins-Minerals (CENTRUM SILVER PO) Oral, Daily   sertraline (ZOLOFT) 25 mg, Oral, Daily   simvastatin (ZOCOR) 20 mg, Oral, Daily   telmisartan (MICARDIS) 40 MG tablet TAKE 1 TABLET BY MOUTH EVERYDAY AT BEDTIME     VITALS:  There were no vitals filed for this visit.  PHYSICAL EXAM   HEENT:  Normocephalic, atraumatic. The mucous membranes are moist. The superficial temporal arteries are without ropiness or tenderness. Cardiovascular: Regular rate and rhythm. Lungs: Clear to auscultation bilaterally. Neck: There are no carotid bruits noted bilaterally.  NEUROLOGICAL:     No data to display             03/10/2016    9:16 AM  MMSE - Mini Mental State Exam  Orientation to time 5  Orientation to Place 5  Registration 3   Attention/ Calculation 5  Recall 3  Language- name 2 objects 2  Language- repeat 1  Language- follow 3 step command 3  Language- read & follow direction 1  Write a sentence 1  Copy design 1  Total score 30     Orientation:  Alert and oriented to person, not to place and time***. No aphasia or dysarthria. Fund of knowledge is reduced. Recent and remote memory impaired.  Attention and concentration are reduced.  Able to name objects and unable to repeat phrases. Delayed recall    Cranial nerves: There is good facial symmetry. Extraocular muscles are intact and visual fields are full to confrontational testing. Speech is fluent and clear, no tongue deviation. Hearing is intact to conversational tone.*** Tone: Tone is good throughout. Sensation: Sensation is intact to light touch and pinprick throughout. Vibration is intact at the bilateral big toe. Coordination: The patient has no difficulty with RAM's or FNF bilaterally. Normal finger to nose  Motor: Strength is 5/5 in the bilateral upper and lower extremities. There is no pronator drift. There are no fasciculations noted. DTR's: Deep tendon reflexes are 2/4 .  Plantar responses are downgoing bilaterally. Gait and Station: The patient is able to ambulate with difficulty. Needs a walker to ambulate ***. Gait is cautious and narrow.      Thank you for allowing [place] the opportunity to participate in the care of this nice patient. Please do not hesitate to contact us for any questions or concerns.   Total time spent on today's visit was *** minutes dedicated to this patient today, preparing to see patient, examining the patient, ordering tests and/or medications and counseling the patient, documenting clinical information in the EHR or other health record, independently interpreting results and communicating results to the patient/family, discussing treatment and goals, answering patient's questions and coordinating care.  Cc:  Sherlene Shams,  MD  Marlowe Kays 07/02/2023 3:33 PM

## 2023-07-04 ENCOUNTER — Ambulatory Visit: Payer: Medicare PPO | Admitting: Physician Assistant

## 2023-07-04 ENCOUNTER — Other Ambulatory Visit (INDEPENDENT_AMBULATORY_CARE_PROVIDER_SITE_OTHER): Payer: Medicare PPO

## 2023-07-04 ENCOUNTER — Ambulatory Visit: Payer: Medicare PPO

## 2023-07-04 ENCOUNTER — Encounter: Payer: Self-pay | Admitting: Physician Assistant

## 2023-07-04 VITALS — BP 127/67 | HR 60 | Resp 18 | Ht 62.0 in | Wt 153.0 lb

## 2023-07-04 DIAGNOSIS — R413 Other amnesia: Secondary | ICD-10-CM

## 2023-07-04 LAB — VITAMIN B12: Vitamin B-12: >1501 pg/mL — ABNORMAL HIGH (ref 211–911)

## 2023-07-04 NOTE — Progress Notes (Signed)
B12 is more than enough, can skip a month or so of B12. Thanks

## 2023-07-04 NOTE — Patient Instructions (Addendum)
It was a pleasure to see you today at our office.   Recommendations:  Neurocognitive evaluation at our office   MRI of the brain, the radiology office will call you to arrange you appointment   Check labs today   Make sure to discuss cardiology follow up with Primary for bradycardia  Follow up in 1 month     For psychiatric meds, mood meds: Please have your primary care physician manage these medications.  If you have any severe symptoms of a stroke, or other severe issues such as confusion,severe chills or fever, etc call 911 or go to the ER as you may need to be evaluated further   For guidance regarding [facility] and if placement were needed at the facility, contact Social Worker tel: [phone]  For assessment of decision of mental capacity and competency:  Call Dr. Erick Blinks, geriatric psychiatrist at [phone]  Counseling regarding caregiver distress, including caregiver depression, anxiety and issues regarding community resources, adult day care programs, adult living facilities, or memory care questions:  please contact your  Primary Doctor's Social Worker   Whom to call: Memory  decline, memory medications: Call our office [phone]    [email or web address]   RECOMMENDATIONS FOR ALL PATIENTS WITH MEMORY PROBLEMS: 1. Continue to exercise (Recommend 30 minutes of walking everyday, or 3 hours every week) 2. Increase social interactions - continue going to [place] and enjoy social gatherings with friends and family 3. Eat healthy, avoid fried foods and eat more fruits and vegetables 4. Maintain adequate blood pressure, blood sugar, and blood cholesterol level. Reducing the risk of stroke and cardiovascular disease also helps promoting better memory. 5. Avoid stressful situations. Live a simple life and avoid aggravations. Organize your time and prepare for the next day in anticipation. 6. Sleep  well, avoid any interruptions of sleep and avoid any distractions in the bedroom that may interfere with adequate sleep quality 7. Avoid sugar, avoid sweets as there is a strong link between excessive sugar intake, diabetes, and cognitive impairment We discussed the Mediterranean diet, which has been shown to help patients reduce the risk of progressive memory disorders and reduces cardiovascular risk. This includes eating fish, eat fruits and green leafy vegetables, nuts like almonds and hazelnuts, walnuts, and also use olive oil. Avoid fast foods and fried foods as much as possible. Avoid sweets and sugar as sugar use has been linked to worsening of memory function.  There is always a concern of gradual progression of memory problems. If this is the case, then we may need to adjust level of care according to patient needs. Support, both to the patient and caregiver, should then be put into place.      You have been referred for a neuropsychological evaluation (i.e., evaluation of memory and thinking abilities). Please bring someone with you to this appointment if possible, as it is helpful for the doctor to hear from both you and another adult who knows you well. Please bring eyeglasses and hearing aids if you wear them.    The evaluation will take approximately 3 hours and has two parts:   The first part is a clinical interview with the neuropsychologist (Dr. Milbert Coulter or Dr. Roseanne Reno). During the interview, the neuropsychologist will speak with you and the individual you brought to the appointment.    The second part of the evaluation is testing with the doctor's technician Annabelle Harman or Selena Batten). During the testing, the technician will ask you to remember different types of material,  solve problems, and answer some questionnaires. Your family member will not be present for this portion of the evaluation.   Please note: We must reserve several hours of the neuropsychologist's time and the psychometrician's  time for your evaluation appointment. As such, there is a No-Show fee of $100. If you are unable to attend any of your appointments, please contact our office as soon as possible to reschedule.      DRIVING: Regarding driving, in patients with progressive memory problems, driving will be impaired. We advise to have someone else do the driving if trouble finding directions or if minor accidents are reported. Independent driving assessment is available to determine safety of driving.   If you are interested in the driving assessment, you can contact the following:  [facility] [employer] in [place] [phone]  [facility] [phone]  [facility] [phone]  [facility] [phone] or [phone]   FALL PRECAUTIONS: Be cautious when walking. Scan the area for obstacles that may increase the risk of trips and falls. When getting up in the mornings, sit up at the edge of the bed for a few minutes before getting out of bed. Consider elevating the bed at the head end to avoid drop of blood pressure when getting up. Walk always in a well-lit room (use night lights in the walls). Avoid area rugs or power cords from appliances in the middle of the walkways. Use a walker or a cane if necessary and consider physical therapy for balance exercise. Get your eyesight checked regularly.  FINANCIAL OVERSIGHT: Supervision, especially oversight when making financial decisions or transactions is also recommended.  HOME SAFETY: Consider the safety of the kitchen when operating appliances like stoves, microwave oven, and blender. Consider having supervision and share cooking responsibilities until no longer able to participate in those. Accidents with firearms and other hazards in the house should be identified and addressed as well.   ABILITY TO BE LEFT ALONE: If patient is unable to contact 911 operator, consider using LifeLine, or when the need is there, arrange  for someone to stay with patients. Smoking is a fire hazard, consider supervision or cessation. Risk of wandering should be assessed by caregiver and if detected at any point, supervision and safe proof recommendations should be instituted.  MEDICATION SUPERVISION: Inability to self-administer medication needs to be constantly addressed. Implement a mechanism to ensure safe administration of the medications.      Mediterranean Diet A Mediterranean diet refers to food and lifestyle choices that are based on the traditions of countries located on the [place]. This way of eating has been shown to help prevent certain conditions and improve outcomes for people who have chronic diseases, like kidney disease and heart disease. What are tips for following this plan? Lifestyle  Cook and eat meals together with your family, when possible. Drink enough fluid to keep your urine clear or pale yellow. Be physically active every day. This includes: Aerobic exercise like running or swimming. Leisure activities like gardening, walking, or housework. Get 7-8 hours of sleep each night. If recommended by your health care provider, drink red wine in moderation. This means 1 glass a day for nonpregnant women and 2 glasses a day for men. A glass of wine equals 5 oz (150 mL). Reading food labels  Check the serving size of packaged foods. For foods such as rice and pasta, the serving size refers to the amount of cooked product, not dry. Check the total fat in packaged foods.  Avoid foods that have saturated fat or trans fats. Check the ingredients list for added sugars, such as corn syrup. Shopping  At the grocery store, buy most of your food from the areas near the walls of the store. This includes: Fresh fruits and vegetables (produce). Grains, beans, nuts, and seeds. Some of these may be available in unpackaged forms or large amounts (in bulk). Fresh seafood. Poultry and eggs. Low-fat dairy  products. Buy whole ingredients instead of prepackaged foods. Buy fresh fruits and vegetables in-season from local farmers markets. Buy frozen fruits and vegetables in resealable bags. If you do not have access to quality fresh seafood, buy precooked frozen shrimp or canned fish, such as tuna, salmon, or sardines. Buy small amounts of raw or cooked vegetables, salads, or olives from the deli or salad bar at your store. Stock your pantry so you always have certain foods on hand, such as olive oil, canned tuna, canned tomatoes, rice, pasta, and beans. Cooking  Cook foods with extra-virgin olive oil instead of using butter or other vegetable oils. Have meat as a side dish, and have vegetables or grains as your main dish. This means having meat in small portions or adding small amounts of meat to foods like pasta or stew. Use beans or vegetables instead of meat in common dishes like chili or lasagna. Experiment with different cooking methods. Try roasting or broiling vegetables instead of steaming or sauteing them. Add frozen vegetables to soups, stews, pasta, or rice. Add nuts or seeds for added healthy fat at each meal. You can add these to yogurt, salads, or vegetable dishes. Marinate fish or vegetables using olive oil, lemon juice, garlic, and fresh herbs. Meal planning  Plan to eat 1 vegetarian meal one day each week. Try to work up to 2 vegetarian meals, if possible. Eat seafood 2 or more times a week. Have healthy snacks readily available, such as: Vegetable sticks with hummus. Greek yogurt. Fruit and nut trail mix. Eat balanced meals throughout the week. This includes: Fruit: 2-3 servings a day Vegetables: 4-5 servings a day Low-fat dairy: 2 servings a day Fish, poultry, or lean meat: 1 serving a day Beans and legumes: 2 or more servings a week Nuts and seeds: 1-2 servings a day Whole grains: 6-8 servings a day Extra-virgin olive oil: 3-4 servings a day Limit red meat and sweets  to only a few servings a month What are my food choices? Mediterranean diet Recommended Grains: Whole-grain pasta. Brown rice. Bulgar wheat. Polenta. Couscous. Whole-wheat bread. Orpah Cobb. Vegetables: Artichokes. Beets. Broccoli. Cabbage. Carrots. Eggplant. Green beans. Chard. Kale. Spinach. Onions. Leeks. Peas. Squash. Tomatoes. Peppers. Radishes. Fruits: Apples. Apricots. Avocado. Berries. Bananas. Cherries. Dates. Figs. Grapes. Lemons. Melon. Oranges. Peaches. Plums. Pomegranate. Meats and other protein foods: Beans. Almonds. Sunflower seeds. Pine nuts. Peanuts. Cod. Salmon. Scallops. Shrimp. Tuna. Tilapia. Clams. Oysters. Eggs. Dairy: Low-fat milk. Cheese. Greek yogurt. Beverages: Water. Red wine. Herbal tea. Fats and oils: Extra virgin olive oil. Avocado oil. Grape seed oil. Sweets and desserts: [place] yogurt with honey. Baked apples. Poached pears. Trail mix. Seasoning and other foods: Basil. Cilantro. Coriander. Cumin. Mint. Parsley. Sage. Rosemary. Tarragon. Garlic. Oregano. Thyme. Pepper. Balsalmic vinegar. Tahini. Hummus. Tomato sauce. Olives. Mushrooms. Limit these Grains: Prepackaged pasta or rice dishes. Prepackaged cereal with added sugar. Vegetables: Deep fried potatoes (french fries). Fruits: Fruit canned in syrup. Meats and other protein foods: Beef. Pork. Lamb. Poultry with skin. Hot dogs. Tomasa Blase. Dairy: Ice cream. Sour cream. Whole milk. Beverages: Juice. Sugar-sweetened  soft drinks. Beer. Liquor and spirits. Fats and oils: Butter. Canola oil. Vegetable oil. Beef fat (tallow). Lard. Sweets and desserts: Cookies. Cakes. Pies. Candy. Seasoning and other foods: Mayonnaise. Premade sauces and marinades. The items listed may not be a complete list. Talk with your dietitian about what dietary choices are right for you. Summary The Mediterranean diet includes both food and lifestyle choices. Eat a variety of fresh fruits and vegetables, beans, nuts, seeds, and whole  grains. Limit the amount of red meat and sweets that you eat. Talk with your health care provider about whether it is safe for you to drink red wine in moderation. This means 1 glass a day for nonpregnant women and 2 glasses a day for men. A glass of wine equals 5 oz (150 mL). This information is not intended to replace advice given to you by your health care provider. Make sure you discuss any questions you have with your health care provider. Document Released: 05/13/2016 Document Revised: 06/15/2016 Document Reviewed: 05/13/2016 Elsevier Interactive Patient Education  2017 [employer].   MRI at [facility] [phone] Labs today [address]

## 2023-07-07 LAB — VITAMIN B1: Vitamin B1 (Thiamine): 50 nmol/L — ABNORMAL HIGH (ref 8–30)

## 2023-07-18 ENCOUNTER — Encounter: Payer: Self-pay | Admitting: Physician Assistant

## 2023-07-24 ENCOUNTER — Emergency Department
Admission: EM | Admit: 2023-07-24 | Discharge: 2023-07-24 | Disposition: A | Discharge status: Home / Self Care | Payer: Medicare PPO | Attending: Emergency Medicine | Admitting: Emergency Medicine

## 2023-07-24 ENCOUNTER — Other Ambulatory Visit: Payer: Self-pay

## 2023-07-24 ENCOUNTER — Emergency Department: Payer: Medicare PPO

## 2023-07-24 ENCOUNTER — Ambulatory Visit
Admission: RE | Admit: 2023-07-24 | Discharge: 2023-07-24 | Disposition: A | Discharge status: Home / Self Care | Payer: Medicare PPO | Source: Ambulatory Visit | Attending: Physician Assistant | Admitting: Physician Assistant

## 2023-07-24 ENCOUNTER — Encounter: Payer: Self-pay | Admitting: Emergency Medicine

## 2023-07-24 DIAGNOSIS — W1830XA Fall on same level, unspecified, initial encounter: Secondary | ICD-10-CM | POA: Diagnosis not present

## 2023-07-24 DIAGNOSIS — S0990XA Unspecified injury of head, initial encounter: Secondary | ICD-10-CM

## 2023-07-24 DIAGNOSIS — G44309 Post-traumatic headache, unspecified, not intractable: Secondary | ICD-10-CM | POA: Diagnosis not present

## 2023-07-24 DIAGNOSIS — R413 Other amnesia: Secondary | ICD-10-CM | POA: Diagnosis not present

## 2023-07-24 DIAGNOSIS — S0101XA Laceration without foreign body of scalp, initial encounter: Secondary | ICD-10-CM | POA: Diagnosis not present

## 2023-07-24 DIAGNOSIS — I6782 Cerebral ischemia: Secondary | ICD-10-CM | POA: Diagnosis not present

## 2023-07-24 DIAGNOSIS — G319 Degenerative disease of nervous system, unspecified: Secondary | ICD-10-CM | POA: Diagnosis not present

## 2023-07-24 DIAGNOSIS — M542 Cervicalgia: Secondary | ICD-10-CM | POA: Diagnosis not present

## 2023-07-24 MED ORDER — LIDOCAINE-EPINEPHRINE (PF) 2 %-1:200000 IJ SOLN
20.0000 mL | Freq: Once | INTRAMUSCULAR | Status: AC
  Administered 2023-07-24: 20 mL via INTRADERMAL
  Filled 2023-07-24: qty 20

## 2023-07-24 NOTE — ED Notes (Signed)
This RN to bedside to DC pt. Pt was already gone.

## 2023-07-24 NOTE — ED Provider Notes (Signed)
[facility] Provider Note    Event Date/Time   First MD Initiated Contact with Patient 07/24/23 1626     (approximate)   History   Fall   HPI  Tiffany Benjamin is a 89 y.o. female who presents after a mechanical fall with head injury.  She reports she suffered a laceration to the left side of her head.  No LOC, no other injuries reported.  She is on aspirin.     Physical Exam   Triage Vital Signs: ED Triage Vitals [07/24/23 1458]  Encounter Vitals Group     BP (!) 139/54     Systolic BP Percentile      Diastolic BP Percentile      Pulse Rate (!) 53     Resp 17     Temp (!) 97.5 F (36.4 C)     Temp src      SpO2 98 %     Weight 69.4 kg (153 lb)     Height 1.6 m (5\' 3" )     Head Circumference      Peak Flow      Pain Score 0     Pain Loc      Pain Education      Exclude from Growth Chart     Most recent vital signs: Vitals:   07/24/23 1458  BP: (!) 139/54  Pulse: (!) 53  Resp: 17  Temp: (!) 97.5 F (36.4 C)  SpO2: 98%     General: Awake, no distress.  Pleasant and interactive CV:  Good peripheral perfusion.  Resp:  Normal effort.  Abd:  No distention.  Other:  Proximately 2 cm laceration to the left forehead, bleeding controlled Normal neuroexam   ED Results / Procedures / Treatments   Labs (all labs ordered are listed, but only abnormal results are displayed) Labs Reviewed - No data to display   EKG     RADIOLOGY CT head viewed interpret by me, no acute abnormality    PROCEDURES:  Critical Care performed:   Marland KitchenMarland KitchenLaceration Repair  Date/Time: 07/24/2023 5:14 PM  Performed by: Jene Every, MD Authorized by: Jene Every, MD   Consent:    Consent obtained:  Verbal Anesthesia:    Anesthesia method:  Local infiltration   Local anesthetic:  Lidocaine 2% WITH epi Laceration details:    Location:  Scalp   Scalp location:  Frontal   Length (cm):  2.7 Exploration:    Limited defect created (wound  extended): no   Treatment:    Area cleansed with:  Povidone-iodine   Amount of cleaning:  Standard Skin repair:    Repair method:  Sutures   Suture size:  6-0   Suture material:  Prolene   Suture technique:  Simple interrupted Approximation:    Approximation:  Close Repair type:    Repair type:  Simple Post-procedure details:    Dressing:  Adhesive bandage   Procedure completion:  Tolerated well, no immediate complications    MEDICATIONS ORDERED IN ED: Medications  lidocaine-EPINEPHrine (XYLOCAINE W/EPI) 2 %-1:200000 (PF) injection 20 mL (20 mLs Intradermal Given 07/24/23 1700)     IMPRESSION / MDM / ASSESSMENT AND PLAN / ED COURSE  I reviewed the triage vital signs and the nursing notes. Patient's presentation is most consistent with acute presentation with potential threat to life or bodily function.  Patient presents with head injury as detailed above, differential includes contusion, concussion, ICH, less likely skull fracture.  Laceration will require repair.  Pending imaging    Clinical Course as of 07/24/23 Earlean Polka Jul 24, 2023  1810 CT HEAD WO CONTRAST ( ) [RK]    Clinical Course User Index [RK] Jene Every, MD     FINAL CLINICAL IMPRESSION(S) / ED DIAGNOSES   Final diagnoses:  Injury of head, initial encounter  Laceration of scalp, initial encounter     Rx / DC Orders   ED Discharge Orders     None        Note:  This document was prepared using Dragon voice recognition software and may include unintentional dictation errors.   Jene Every, MD 07/24/23 [phone]

## 2023-07-24 NOTE — ED Notes (Signed)
Pt in scan and they will bring pt to [facility] after scans

## 2023-07-24 NOTE — ED Triage Notes (Signed)
Patient to ED via POV for a fall. States she fell in the grass. Laceration noted to left side of forehead. Bleeding controlled. Denies LOC or blood thinners. AOx4

## 2023-07-29 ENCOUNTER — Telehealth: Payer: Self-pay | Admitting: Physician Assistant

## 2023-07-29 NOTE — Telephone Encounter (Signed)
Mri done 07/24/2023 not resulted note yet

## 2023-07-29 NOTE — Telephone Encounter (Signed)
Pt's daughter called in and left a message wanting to get her results

## 2023-08-17 ENCOUNTER — Ambulatory Visit (INDEPENDENT_AMBULATORY_CARE_PROVIDER_SITE_OTHER): Payer: Medicare PPO | Admitting: Physician Assistant

## 2023-08-17 ENCOUNTER — Encounter: Payer: Self-pay | Admitting: Physician Assistant

## 2023-08-17 VITALS — BP 128/68 | HR 60 | Resp 20 | Ht 62.0 in

## 2023-08-17 DIAGNOSIS — R413 Other amnesia: Secondary | ICD-10-CM | POA: Diagnosis not present

## 2023-08-17 MED ORDER — MEMANTINE HCL 5 MG PO TABS: ORAL_TABLET | ORAL | 11 refills | Status: DC

## 2023-08-17 NOTE — Progress Notes (Signed)
Assessment/Plan:   Memory Impairment with mood disturbance (anxiety), concern for Alzheimer's Disease  Tiffany Benjamin is a very pleasant 89 y.o. RH female  with a history of hypertension, hyperlipidemia, bradycardia,  arthritis, CKD 3B, anxiety seen today in follow up to discuss the MRI of the brain results. These were personally reviewed, remarkable for mild chronic ischemic changes and mild atrophy with mils hippocampal atrophy.There is some volume loss. Radiology reports some chiasma thinning, patient denies any vision changes.  No acute findings. She was last seen on 07/04/23, with [facility] 14/30. She is not on antidementia medications.  On 07/24/23 had a mechanical fall with head injury resulting in laceration on the frontal region, requiring stitches. No LOC. CT head negative for acute findings. She is alert without any worsening of neurological deficits.    This patient is accompanied in the office by her brother, daughter who supplements the history.  Previous records as well as any outside records available were reviewed prior to todays visit.     Follow up in  6 months.  Start Memantine 5 mg twice a day. Side effects discussed (bradycardia) Patient has a Neuropsych testing  in the near future for clarity of diagnosis and disease trajectory   Recommend good control of cardiovascular risk factors Continue to control mood as per PCP    Initial visit 07/04/23 How long did patient have memory difficulties?  For the last year.  Her sister has moved into her home  since last December which allowed more stress and friction," appears to have affected the memory".  Patient has difficulty remembering new information, recent conversations and names of people.  She writes and likes to play solitaire on the phone. appointments in a calendar and writes things down and places it in the fridge door to remember.  She attends daycare  3 times a  for social and cognitive stimulation. repeats oneself?   Endorsed Disoriented when walking into a room?  Denies   Leaving objects in unusual places?   Denies. One time she left the purse in the laundry room.  Wandering behavior? Denies.   Any personality changes, or depression, anxiety?  Has moments of irritability, anxiety. Denies a history of depression.  Hallucinations or paranoia?  Denies. She always carries her pocketbook because my children will take my money.    Seizures? Denies.    Any sleep changes?  Sleeps well, denies vivid dreams, REM behavior or sleepwalking.   Sleep apnea? Denies.   Any hygiene concerns?  Denies.   Independent of bathing and dressing?  Endorsed  Who is in charge of the medications?  Patient is in charge. Daughter who is a Research scientist (life sciences) the medicines.  Who is in charge of the finances?  Daughter is in charge.      Any changes in appetite?   Denies. "I just don't eat enough"  Losing weight.      Patient have trouble swallowing?  Denies.   Does the patient cook?  No   Any headaches?  Denies.   Chronic back pain?  Denies.   Ambulates with difficulty?  Needs a walker to ambulate but has not been using it.  " I use a stationary bike in the garage"  Recent falls or head injuries? "I have fallen and hit my head years ago and  drove myself to the hospital and it wall alright"   Vision changes? Denies. Stroke like symptoms?  Denies.   Any tremors?  Denies.   Any anosmia?  Denies.   Any incontinence of urine? Denies.   Any bowel dysfunction? Denies.      Patient lives with her sister.  History of heavy alcohol intake? She drinks her glass of wine a day.  History of heavy tobacco use? Denies.   Family history of dementia?  Fa had dementia ?type.  Does patient drive? 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     CURRENT MEDICATIONS:  Outpatient Encounter Medications as of 08/17/2023  Medication Sig   amLODipine  (NORVASC) 10 MG tablet Take 1 tablet (10 mg total) by mouth daily. IN THE MORNING FOR HYPERTENSION   aspirin 81 MG tablet Take 81 mg by mouth daily.   busPIRone (BUSPAR) 15 MG tablet Take 1 tablet (15 mg total) by mouth 3 (three) times daily.   cholecalciferol (VITAMIN D3) 25 MCG (1000 UNIT) tablet Take 1,000 Units by mouth daily.   Cyanocobalamin (VITAMIN B-12 PO) Take by mouth daily.   hydrochlorothiazide (HYDRODIURIL) 25 MG tablet Take 1 tablet (25 mg total) by mouth daily. In the morning for hypertension   metoprolol succinate (TOPROL-XL) 50 MG 24 hr tablet Take 1 tablet (50 mg total) by mouth at bedtime. FOR HYPERTENSION   Multiple Vitamins-Minerals (CENTRUM SILVER PO) Take by mouth daily.   sertraline (ZOLOFT) 25 MG tablet Take 1 tablet (25 mg total) by mouth daily.   simvastatin (ZOCOR) 20 MG tablet Take 1 tablet (20 mg total) by mouth daily.   telmisartan (MICARDIS) 40 MG tablet TAKE 1 TABLET BY MOUTH EVERYDAY AT BEDTIME   No facility-administered encounter medications on file as of 08/17/2023.       03/10/2016    9:16 AM  MMSE - Mini Mental State Exam  Orientation to time 5  Orientation to Place 5  Registration 3  Attention/ Calculation 5  Recall 3  Language- name 2 objects 2  Language- repeat 1  Language- follow 3 step command 3  Language- read & follow direction 1  Write a sentence 1  Copy design 1  Total score 30      07/04/2023   12:00 PM  Montreal Cognitive Assessment   Visuospatial/ Executive (0/5) 3  Naming (0/3) 2  Attention: Read list of digits (0/2) 2  Attention: Read list of letters (0/1) 1  Attention: Serial 7 subtraction starting at 100 (0/3) 0  Language: Repeat phrase (0/2) 2  Language : Fluency (0/1) 1  Abstraction (0/2) 2  Delayed Recall (0/5) 1  Orientation (0/6) 0  Total 14  Adjusted Score (based on education) 14   Thank you for allowing [place] the opportunity to participate in the care of this nice patient. Please do not hesitate to contact us for  any questions or concerns.   Total time spent on today's visit was 37 minutes dedicated to this patient today, preparing to see patient, examining the patient, ordering tests and/or medications and counseling the patient, documenting clinical information in the EHR or other health record, independently interpreting results and communicating results to the patient/family, discussing treatment and goals, answering patient's questions and coordinating care.  Cc:  Sherlene Shams, MD  Marlowe Kays 08/17/2023 6:12 AM

## 2023-08-17 NOTE — Patient Instructions (Signed)
It was a pleasure to see you today at our office.   Recommendations:  Neurocognitive evaluation at our office     Start Memantine 5 mg: Take 1 tablet (5 mg at night) for 2 weeks, then increase to 1 tablet (5 mg) twice a day   Make sure to discuss cardiology follow up with Primary for bradycardia  Follow up in 6 months    For psychiatric meds, mood meds: Please have your primary care physician manage these medications.  If you have any severe symptoms of a stroke, or other severe issues such as confusion,severe chills or fever, etc call 911 or go to the ER as you may need to be evaluated further   For guidance regarding [facility] and if placement were needed at the facility, contact Social Worker tel: [phone]  For assessment of decision of mental capacity and competency:  Call Dr. Erick Blinks, geriatric psychiatrist at [phone]  Counseling regarding caregiver distress, including caregiver depression, anxiety and issues regarding community resources, adult day care programs, adult living facilities, or memory care questions:  please contact your  Primary Doctor's Social Worker   Whom to call: Memory  decline, memory medications: Call our office [phone]    [email or web address]   RECOMMENDATIONS FOR ALL PATIENTS WITH MEMORY PROBLEMS: 1. Continue to exercise (Recommend 30 minutes of walking everyday, or 3 hours every week) 2. Increase social interactions - continue going to [place] and enjoy social gatherings with friends and family 3. Eat healthy, avoid fried foods and eat more fruits and vegetables 4. Maintain adequate blood pressure, blood sugar, and blood cholesterol level. Reducing the risk of stroke and cardiovascular disease also helps promoting better memory. 5. Avoid stressful situations. Live a simple life and avoid aggravations. Organize your time and prepare for the next day in  anticipation. 6. Sleep well, avoid any interruptions of sleep and avoid any distractions in the bedroom that may interfere with adequate sleep quality 7. Avoid sugar, avoid sweets as there is a strong link between excessive sugar intake, diabetes, and cognitive impairment We discussed the Mediterranean diet, which has been shown to help patients reduce the risk of progressive memory disorders and reduces cardiovascular risk. This includes eating fish, eat fruits and green leafy vegetables, nuts like almonds and hazelnuts, walnuts, and also use olive oil. Avoid fast foods and fried foods as much as possible. Avoid sweets and sugar as sugar use has been linked to worsening of memory function.  There is always a concern of gradual progression of memory problems. If this is the case, then we may need to adjust level of care according to patient needs. Support, both to the patient and caregiver, should then be put into place.      You have been referred for a neuropsychological evaluation (i.e., evaluation of memory and thinking abilities). Please bring someone with you to this appointment if possible, as it is helpful for the doctor to hear from both you and another adult who knows you well. Please bring eyeglasses and hearing aids if you wear them.    The evaluation will take approximately 3 hours and has two parts:   The first part is a clinical interview with the neuropsychologist (Dr. Milbert Coulter or Dr. Roseanne Reno). During the interview, the neuropsychologist will speak with you and the individual you brought to the appointment.    The second part of the evaluation is testing with the doctor's technician Annabelle Harman or Selena Batten). During the testing, the technician will ask you  to remember different types of material, solve problems, and answer some questionnaires. Your family member will not be present for this portion of the evaluation.   Please note: We must reserve several hours of the neuropsychologist's time and  the psychometrician's time for your evaluation appointment. As such, there is a No-Show fee of $100. If you are unable to attend any of your appointments, please contact our office as soon as possible to reschedule.      DRIVING: Regarding driving, in patients with progressive memory problems, driving will be impaired. We advise to have someone else do the driving if trouble finding directions or if minor accidents are reported. Independent driving assessment is available to determine safety of driving.   If you are interested in the driving assessment, you can contact the following:  [facility] in [place] [phone]  [facility] [phone]  [facility] [phone]  [facility] [phone] or [phone]   FALL PRECAUTIONS: Be cautious when walking. Scan the area for obstacles that may increase the risk of trips and falls. When getting up in the mornings, sit up at the edge of the bed for a few minutes before getting out of bed. Consider elevating the bed at the head end to avoid drop of blood pressure when getting up. Walk always in a well-lit room (use night lights in the walls). Avoid area rugs or power cords from appliances in the middle of the walkways. Use a walker or a cane if necessary and consider physical therapy for balance exercise. Get your eyesight checked regularly.  FINANCIAL OVERSIGHT: Supervision, especially oversight when making financial decisions or transactions is also recommended.  HOME SAFETY: Consider the safety of the kitchen when operating appliances like stoves, microwave oven, and blender. Consider having supervision and share cooking responsibilities until no longer able to participate in those. Accidents with firearms and other hazards in the house should be identified and addressed as well.   ABILITY TO BE LEFT ALONE: If patient is unable to contact 911 operator, consider using LifeLine, or when the  need is there, arrange for someone to stay with patients. Smoking is a fire hazard, consider supervision or cessation. Risk of wandering should be assessed by caregiver and if detected at any point, supervision and safe proof recommendations should be instituted.  MEDICATION SUPERVISION: Inability to self-administer medication needs to be constantly addressed. Implement a mechanism to ensure safe administration of the medications.      Mediterranean Diet A Mediterranean diet refers to food and lifestyle choices that are based on the traditions of countries located on the [place]. This way of eating has been shown to help prevent certain conditions and improve outcomes for people who have chronic diseases, like kidney disease and heart disease. What are tips for following this plan? Lifestyle  Cook and eat meals together with your family, when possible. Drink enough fluid to keep your urine clear or pale yellow. Be physically active every day. This includes: Aerobic exercise like running or swimming. Leisure activities like gardening, walking, or housework. Get 7-8 hours of sleep each night. If recommended by your health care provider, drink red wine in moderation. This means 1 glass a day for nonpregnant women and 2 glasses a day for men. A glass of wine equals 5 oz (150 mL). Reading food labels  Check the serving size of packaged foods. For foods such as rice and pasta, the serving size refers to the amount of cooked product, not dry. Check  the total fat in packaged foods. Avoid foods that have saturated fat or trans fats. Check the ingredients list for added sugars, such as corn syrup. Shopping  At the grocery store, buy most of your food from the areas near the walls of the store. This includes: Fresh fruits and vegetables (produce). Grains, beans, nuts, and seeds. Some of these may be available in unpackaged forms or large amounts (in bulk). Fresh seafood. Poultry and  eggs. Low-fat dairy products. Buy whole ingredients instead of prepackaged foods. Buy fresh fruits and vegetables in-season from local farmers markets. Buy frozen fruits and vegetables in resealable bags. If you do not have access to quality fresh seafood, buy precooked frozen shrimp or canned fish, such as tuna, salmon, or sardines. Buy small amounts of raw or cooked vegetables, salads, or olives from the deli or salad bar at your store. Stock your pantry so you always have certain foods on hand, such as olive oil, canned tuna, canned tomatoes, rice, pasta, and beans. Cooking  Cook foods with extra-virgin olive oil instead of using butter or other vegetable oils. Have meat as a side dish, and have vegetables or grains as your main dish. This means having meat in small portions or adding small amounts of meat to foods like pasta or stew. Use beans or vegetables instead of meat in common dishes like chili or lasagna. Experiment with different cooking methods. Try roasting or broiling vegetables instead of steaming or sauteing them. Add frozen vegetables to soups, stews, pasta, or rice. Add nuts or seeds for added healthy fat at each meal. You can add these to yogurt, salads, or vegetable dishes. Marinate fish or vegetables using olive oil, lemon juice, garlic, and fresh herbs. Meal planning  Plan to eat 1 vegetarian meal one day each week. Try to work up to 2 vegetarian meals, if possible. Eat seafood 2 or more times a week. Have healthy snacks readily available, such as: Vegetable sticks with hummus. Greek yogurt. Fruit and nut trail mix. Eat balanced meals throughout the week. This includes: Fruit: 2-3 servings a day Vegetables: 4-5 servings a day Low-fat dairy: 2 servings a day Fish, poultry, or lean meat: 1 serving a day Beans and legumes: 2 or more servings a week Nuts and seeds: 1-2 servings a day Whole grains: 6-8 servings a day Extra-virgin olive oil: 3-4 servings a day Limit  red meat and sweets to only a few servings a month What are my food choices? Mediterranean diet Recommended Grains: Whole-grain pasta. Brown rice. Bulgar wheat. Polenta. Couscous. Whole-wheat bread. Orpah Cobb. Vegetables: Artichokes. Beets. Broccoli. Cabbage. Carrots. Eggplant. Green beans. Chard. Kale. Spinach. Onions. Leeks. Peas. Squash. Tomatoes. Peppers. Radishes. Fruits: Apples. Apricots. Avocado. Berries. Bananas. Cherries. Dates. Figs. Grapes. Lemons. Melon. Oranges. Peaches. Plums. Pomegranate. Meats and other protein foods: Beans. Almonds. Sunflower seeds. Pine nuts. Peanuts. Cod. Salmon. Scallops. Shrimp. Tuna. Tilapia. Clams. Oysters. Eggs. Dairy: Low-fat milk. Cheese. Greek yogurt. Beverages: Water. Red wine. Herbal tea. Fats and oils: Extra virgin olive oil. Avocado oil. Grape seed oil. Sweets and desserts: [place] yogurt with honey. Baked apples. Poached pears. Trail mix. Seasoning and other foods: Basil. Cilantro. Coriander. Cumin. Mint. Parsley. Sage. Rosemary. Tarragon. Garlic. Oregano. Thyme. Pepper. Balsalmic vinegar. Tahini. Hummus. Tomato sauce. Olives. Mushrooms. Limit these Grains: Prepackaged pasta or rice dishes. Prepackaged cereal with added sugar. Vegetables: Deep fried potatoes (french fries). Fruits: Fruit canned in syrup. Meats and other protein foods: Beef. Pork. Lamb. Poultry with skin. Hot dogs. Tomasa Blase. Dairy: Ice cream. Sour  cream. Whole milk. Beverages: Juice. Sugar-sweetened soft drinks. Beer. Liquor and spirits. Fats and oils: Butter. Canola oil. Vegetable oil. Beef fat (tallow). Lard. Sweets and desserts: Cookies. Cakes. Pies. Candy. Seasoning and other foods: Mayonnaise. Premade sauces and marinades. The items listed may not be a complete list. Talk with your dietitian about what dietary choices are right for you. Summary The Mediterranean diet includes both food and lifestyle choices. Eat a variety of fresh fruits and vegetables, beans, nuts,  seeds, and whole grains. Limit the amount of red meat and sweets that you eat. Talk with your health care provider about whether it is safe for you to drink red wine in moderation. This means 1 glass a day for nonpregnant women and 2 glasses a day for men. A glass of wine equals 5 oz (150 mL). This information is not intended to replace advice given to you by your health care provider. Make sure you discuss any questions you have with your health care provider. Document Released: 05/13/2016 Document Revised: 06/15/2016 Document Reviewed: 05/13/2016 Elsevier Interactive Patient Education  2017 [employer].   MRI at [facility] [phone] Labs today [address]

## 2023-08-30 ENCOUNTER — Encounter: Payer: Self-pay | Admitting: Internal Medicine

## 2023-08-30 ENCOUNTER — Ambulatory Visit: Payer: Medicare PPO | Admitting: Internal Medicine

## 2023-08-30 VITALS — BP 150/76 | HR 103 | Ht 62.0 in | Wt 158.8 lb

## 2023-08-30 DIAGNOSIS — F01A11 Vascular dementia, mild, with agitation: Secondary | ICD-10-CM

## 2023-08-30 DIAGNOSIS — M170 Bilateral primary osteoarthritis of knee: Secondary | ICD-10-CM

## 2023-08-30 DIAGNOSIS — Z23 Encounter for immunization: Secondary | ICD-10-CM | POA: Diagnosis not present

## 2023-08-30 DIAGNOSIS — G8929 Other chronic pain: Secondary | ICD-10-CM | POA: Diagnosis not present

## 2023-08-30 DIAGNOSIS — S0181XS Laceration without foreign body of other part of head, sequela: Secondary | ICD-10-CM

## 2023-08-30 DIAGNOSIS — S0181XA Laceration without foreign body of other part of head, initial encounter: Secondary | ICD-10-CM | POA: Insufficient documentation

## 2023-08-30 HISTORY — DX: Laceration without foreign body of other part of head, initial encounter: S01.81XA

## 2023-08-30 MED ORDER — TRIAMCINOLONE ACETONIDE 40 MG/ML IJ SUSP
40.0000 mg | Freq: Once | INTRAMUSCULAR | Status: AC
  Administered 2023-08-30: 40 mg

## 2023-08-30 MED ORDER — LIDOCAINE HCL 1 % IJ SOLN
4.0000 mL | Freq: Once | INTRAMUSCULAR | Status: AC
  Administered 2023-08-30: 4 mL

## 2023-08-30 NOTE — Progress Notes (Signed)
Subjective:  Patient ID: Tiffany Benjamin, female    DOB: 1933/02/16  Age: 89 y.o. MRN: [id number]  CC: The primary encounter diagnosis was Need for influenza vaccination. Diagnoses of Bilateral chronic knee pain, Mild vascular dementia with agitation (HCC), Tricompartment osteoarthritis of both knees, Chronic pain of both knees, and Facial laceration, sequela were also pertinent to this visit.   HPI Tiffany Benjamin presents for  Chief Complaint  Patient presents with   Cough   Cough has resolved.  She has requested bilateral knee injections as her knee pain has been moderate for the past month.  Last I/S steroid inejction was in ealry August   1) 2 stitches placed by ER physician Oct 20 during ER evaluation post fall.  Still in place  2) neurology eval done including  MRI brain. Namenda  started tolerating 5 mg daily dose .  Her daughter states that she has been more calm since the medication start with plans to increase to bid     Outpatient Medications Prior to Visit  Medication Sig Dispense Refill   amLODipine (NORVASC) 10 MG tablet Take 1 tablet (10 mg total) by mouth daily. IN THE MORNING FOR HYPERTENSION 90 tablet 1   aspirin 81 MG tablet Take 81 mg by mouth daily.     busPIRone (BUSPAR) 15 MG tablet Take 1 tablet (15 mg total) by mouth 3 (three) times daily. 270 tablet 1   cholecalciferol (VITAMIN D3) 25 MCG (1000 UNIT) tablet Take 1,000 Units by mouth daily.     Cyanocobalamin (VITAMIN B-12 PO) Take by mouth daily.     hydrochlorothiazide (HYDRODIURIL) 25 MG tablet Take 1 tablet (25 mg total) by mouth daily. In the morning for hypertension 90 tablet 1   memantine (NAMENDA) 5 MG tablet Take 1 tablet (5 mg at night) for 2 weeks, then increase to 1 tablet (5 mg) twice a day 60 tablet 11   metoprolol succinate (TOPROL-XL) 50 MG 24 hr tablet Take 1 tablet (50 mg total) by mouth at bedtime. FOR HYPERTENSION 90 tablet 1   Multiple Vitamins-Minerals (CENTRUM SILVER PO) Take by mouth  daily.     sertraline (ZOLOFT) 25 MG tablet Take 1 tablet (25 mg total) by mouth daily. 90 tablet 1   simvastatin (ZOCOR) 20 MG tablet Take 1 tablet (20 mg total) by mouth daily. 90 tablet 1   telmisartan (MICARDIS) 40 MG tablet TAKE 1 TABLET BY MOUTH EVERYDAY AT BEDTIME 90 tablet 1   No facility-administered medications prior to visit.    Review of Systems;  Patient denies headache, fevers, malaise, unintentional weight loss, skin rash, eye pain, sinus congestion and sinus pain, sore throat, dysphagia,  hemoptysis , cough, dyspnea, wheezing, chest pain, palpitations, orthopnea, edema, abdominal pain, nausea, melena, diarrhea, constipation, flank pain, dysuria, hematuria, urinary  Frequency, nocturia, numbness, tingling, seizures,  Focal weakness, Loss of consciousness,  Tremor, insomnia, depression, anxiety, and suicidal ideation.      Objective:  BP (!) 150/76   Pulse (!) 103   Ht 5\' 2"  (1.575 m)   Wt 158 lb 12.8 oz (72 kg)   SpO2 99%   BMI 29.04 kg/m   BP Readings from Last 3 Encounters:  08/30/23 (!) 150/76  08/17/23 128/68  07/24/23 (!) 139/54    Wt Readings from Last 3 Encounters:  08/30/23 158 lb 12.8 oz (72 kg)  07/24/23 153 lb (69.4 kg)  07/04/23 153 lb (69.4 kg)    Physical Exam Vitals reviewed.  Constitutional:  General: She is not in acute distress.    Appearance: Normal appearance. She is normal weight. She is not ill-appearing, toxic-appearing or diaphoretic.  HENT:     Head: Normocephalic.  Eyes:     General: No scleral icterus.       Right eye: No discharge.        Left eye: No discharge.     Conjunctiva/sclera: Conjunctivae normal.  Cardiovascular:     Rate and Rhythm: Normal rate and regular rhythm.     Heart sounds: Normal heart sounds.  Pulmonary:     Effort: Pulmonary effort is normal. No respiratory distress.     Breath sounds: Normal breath sounds.  Musculoskeletal:        General: Normal range of motion.  Skin:    General: Skin is  warm and dry.  Neurological:     General: No focal deficit present.     Mental Status: She is alert and oriented to person, place, and time. Mental status is at baseline.  Psychiatric:        Mood and Affect: Mood normal.        Behavior: Behavior normal.        Thought Content: Thought content normal.        Judgment: Judgment normal.    Lab Results  Component Value Date   HGBA1C 5.6 06/08/2023   HGBA1C 5.7 07/15/2021   HGBA1C 5.7 02/07/2020    Lab Results  Component Value Date   CREATININE 1.24 (H) 06/08/2023   CREATININE 1.11 12/22/2022   CREATININE 1.29 (H) 06/02/2022    Lab Results  Component Value Date   WBC 5.2 12/22/2022   HGB 12.4 12/22/2022   HCT 37.4 12/22/2022   PLT 248.0 12/22/2022   GLUCOSE 81 06/08/2023   CHOL 189 06/08/2023   TRIG 83.0 06/08/2023   HDL 77.20 06/08/2023   LDLDIRECT 107.0 06/08/2023   LDLCALC 95 06/08/2023   ALT 13 06/08/2023   AST 17 06/08/2023   NA 143 06/08/2023   K 3.7 06/08/2023   CL 104 06/08/2023   CREATININE 1.24 (H) 06/08/2023   BUN 21 06/08/2023   CO2 30 06/08/2023   TSH 0.84 06/08/2023   HGBA1C 5.6 06/08/2023   MICROALBUR 6.4 (H) 06/08/2023    MR BRAIN WO CONTRAST  Result Date: 08/17/2023 CLINICAL DATA:  Memory impairment EXAM: MRI HEAD WITHOUT CONTRAST TECHNIQUE: Multiplanar, multiecho pulse sequences of the brain and surrounding structures were obtained without intravenous contrast. COMPARISON:  Head CT from the same date FINDINGS: Brain: Age congruent brain volume and morphology. No acute infarction, hemorrhage, hydrocephalus, extra-axial collection or mass lesion. Chronic small vessel ischemia and cerebral volume loss which is mild for age. Thin appearance of the optic chiasm, nonspecific. Vascular: Normal flow voids. Skull and upper cervical spine: Normal marrow signal. Sinuses/Orbits: Negative. IMPRESSION: Age congruent involutional changes. No specific or reversible cause for memory loss. Electronically Signed   By:  Tiburcio Pea M.D.   On: 08/17/2023 06:25   CT HEAD WO CONTRAST ( )  Result Date: 07/24/2023 CLINICAL DATA:  Recent fall with headaches and neck pain, initial encounter EXAM: CT HEAD WITHOUT CONTRAST CT CERVICAL SPINE WITHOUT CONTRAST TECHNIQUE: Multidetector CT imaging of the head and cervical spine was performed following the standard protocol without intravenous contrast. Multiplanar CT image reconstructions of the cervical spine were also generated. RADIATION DOSE REDUCTION: This exam was performed according to the departmental dose-optimization program which includes automated exposure control, adjustment of the mA and/or kV according to  patient size and/or use of iterative reconstruction technique. COMPARISON:  None Available. FINDINGS: CT HEAD FINDINGS Brain: No evidence of acute infarction, hemorrhage, hydrocephalus, extra-axial collection or mass lesion/mass effect. Mild atrophic and chronic white matter ischemic changes are noted. Vascular: No hyperdense vessel or unexpected calcification. Skull: Normal. Negative for fracture or focal lesion. Sinuses/Orbits: No acute finding. Other: None. CT CERVICAL SPINE FINDINGS Alignment: Within normal limits. Skull base and vertebrae: 7 cervical segments are well visualized. Vertebral body height is well maintained. Osteophytic changes are noted from C5-C7. Multilevel facet hypertrophic changes are noted. No acute fracture or acute facet abnormality is noted. The odontoid is within normal limits. Soft tissues and spinal canal: Surrounding soft tissue structures are within normal limits. No focal hematoma is noted. Upper chest: Visualized lung apices are unremarkable. Other: None IMPRESSION: CT of the head: Chronic atrophic and ischemic changes without acute abnormality. CT of the cervical spine: Multilevel degenerative change without acute abnormality. Electronically Signed   By: Alcide Clever M.D.   On: 07/24/2023 18:04   CT Cervical Spine Wo  Contrast  Result Date: 07/24/2023 CLINICAL DATA:  Recent fall with headaches and neck pain, initial encounter EXAM: CT HEAD WITHOUT CONTRAST CT CERVICAL SPINE WITHOUT CONTRAST TECHNIQUE: Multidetector CT imaging of the head and cervical spine was performed following the standard protocol without intravenous contrast. Multiplanar CT image reconstructions of the cervical spine were also generated. RADIATION DOSE REDUCTION: This exam was performed according to the departmental dose-optimization program which includes automated exposure control, adjustment of the mA and/or kV according to patient size and/or use of iterative reconstruction technique. COMPARISON:  None Available. FINDINGS: CT HEAD FINDINGS Brain: No evidence of acute infarction, hemorrhage, hydrocephalus, extra-axial collection or mass lesion/mass effect. Mild atrophic and chronic white matter ischemic changes are noted. Vascular: No hyperdense vessel or unexpected calcification. Skull: Normal. Negative for fracture or focal lesion. Sinuses/Orbits: No acute finding. Other: None. CT CERVICAL SPINE FINDINGS Alignment: Within normal limits. Skull base and vertebrae: 7 cervical segments are well visualized. Vertebral body height is well maintained. Osteophytic changes are noted from C5-C7. Multilevel facet hypertrophic changes are noted. No acute fracture or acute facet abnormality is noted. The odontoid is within normal limits. Soft tissues and spinal canal: Surrounding soft tissue structures are within normal limits. No focal hematoma is noted. Upper chest: Visualized lung apices are unremarkable. Other: None IMPRESSION: CT of the head: Chronic atrophic and ischemic changes without acute abnormality. CT of the cervical spine: Multilevel degenerative change without acute abnormality. Electronically Signed   By: Alcide Clever M.D.   On: 07/24/2023 18:04    Assessment & Plan:  .Need for influenza vaccination -     Flu Vaccine Trivalent High Dose  (Fluad)  Bilateral chronic knee pain -     Triamcinolone Acetonide -     Triamcinolone Acetonide -     Lidocaine HCl -     Lidocaine HCl  Mild vascular dementia with agitation [facility]) Assessment & Plan: S/p neurology evaluation. Improving with Namenda   Tricompartment osteoarthritis of both knees Assessment & Plan: Continue tylenol and tramadol for pain control.   Today she received  bilateral knee  steroid injections .     Chronic pain of both knees Assessment & Plan: After obtaining informed consent for  an I/A injection of the right  knee,  The right knee was cleaned with betadine and alcohol.  Topical anesthetic was sprayed on medial side of  the fight patella and 1 ml (40 mg)  Kenalog mixed with 4 ml 1% lidocaine was injected without difficulty into the bursa,  Patient tolerated the procedure without complications or bleeding.  Patient was advised to apply ice for 15 minutes every few hours for the first 24 hours and to avoid strenuous activity for up to 7 days     After obtaining informed consent for  an I/A injection of the left knee,  The right knee was cleaned with betadine and alcohol.  Topical anesthetic was sprayed on medial side of left patella and  1 ml  (40 mg ) Kenalog mixed with 4 ml 1% lidocaine was injected without difficulty into the bursa,  Patient tolerated the procedure without complications or bleeding.  Patient was advised to apply ice for 15 minutes every few hours for the first 24 hours and to avoid strenuous activity for up to 7 days   Managed with tylenol, tramadol and periodic I/A injections,  last one August  2024.     Facial laceration, sequela Assessment & Plan: Left upper eyebrown. Stitches removed today      I provided 30 minutes of face-to-face time during this encounter reviewing patient's last visit with me, patient's  most recent visit with cardiology,  nephrology,  and neurology,  recent surgical and non surgical procedures, previous  labs and  imaging studies, counseling on currently addressed issues,  and post visit ordering to diagnostics and therapeutics .   Follow-up: Return in about 3 months (around 11/30/2023).   Sherlene Shams, MD

## 2023-08-30 NOTE — Assessment & Plan Note (Addendum)
S/p neurology evaluation. Improving with Namenda

## 2023-08-30 NOTE — Assessment & Plan Note (Signed)
Continue tylenol and tramadol for pain control.   Today she received  bilateral knee  steroid injections .

## 2023-08-30 NOTE — Assessment & Plan Note (Signed)
After obtaining informed consent for  an I/A injection of the right  knee,  The right knee was cleaned with betadine and alcohol.  Topical anesthetic was sprayed on medial side of  the fight patella and 1 ml (40 mg)  Kenalog mixed with 4 ml 1% lidocaine was injected without difficulty into the bursa,  Patient tolerated the procedure without complications or bleeding.  Patient was advised to apply ice for 15 minutes every few hours for the first 24 hours and to avoid strenuous activity for up to 7 days     After obtaining informed consent for  an I/A injection of the left knee,  The right knee was cleaned with betadine and alcohol.  Topical anesthetic was sprayed on medial side of left patella and  1 ml  (40 mg ) Kenalog mixed with 4 ml 1% lidocaine was injected without difficulty into the bursa,  Patient tolerated the procedure without complications or bleeding.  Patient was advised to apply ice for 15 minutes every few hours for the first 24 hours and to avoid strenuous activity for up to 7 days   Managed with tylenol, tramadol and periodic I/A injections,  last one August  2024.

## 2023-08-30 NOTE — Assessment & Plan Note (Signed)
Left upper eyebrown. Stitches removed today

## 2023-09-07 ENCOUNTER — Ambulatory Visit: Payer: Medicare PPO | Admitting: Internal Medicine

## 2023-09-08 ENCOUNTER — Other Ambulatory Visit: Payer: Self-pay | Admitting: Physician Assistant

## 2023-10-06 ENCOUNTER — Telehealth: Payer: Self-pay | Admitting: Internal Medicine

## 2023-10-06 DIAGNOSIS — Z0279 Encounter for issue of other medical certificate: Secondary | ICD-10-CM

## 2023-10-06 NOTE — Telephone Encounter (Signed)
 Patient dropped off document  Health Medical Exam Forms  , to be filled out by provider. Patient requested to send it back via Fax to [phone], please make copy for patient's daughter to pick up within ASAP. Document is located in providers tray at front office.Please advise at Mobile [phone] (mobile)

## 2023-10-07 ENCOUNTER — Ambulatory Visit: Payer: Medicare PPO | Admitting: Psychology

## 2023-10-07 ENCOUNTER — Encounter: Payer: Self-pay | Admitting: Psychology

## 2023-10-07 DIAGNOSIS — I10 Essential (primary) hypertension: Secondary | ICD-10-CM | POA: Insufficient documentation

## 2023-10-07 DIAGNOSIS — G309 Alzheimer's disease, unspecified: Secondary | ICD-10-CM | POA: Diagnosis not present

## 2023-10-07 DIAGNOSIS — F0280 Dementia in other diseases classified elsewhere without behavioral disturbance: Secondary | ICD-10-CM

## 2023-10-07 DIAGNOSIS — R4189 Other symptoms and signs involving cognitive functions and awareness: Secondary | ICD-10-CM

## 2023-10-07 HISTORY — DX: Dementia in other diseases classified elsewhere, unspecified severity, without behavioral disturbance, psychotic disturbance, mood disturbance, and anxiety: F02.80

## 2023-10-07 NOTE — Telephone Encounter (Signed)
 I have placed in red folder for completion.

## 2023-10-07 NOTE — Telephone Encounter (Signed)
 Copied from CRM [id number]. Topic: General - Other >> Oct 07, 2023  9:37 AM Robinson H wrote: Reason for CRM: Patients daughter called to check status of paperwork dropped off so patient can [facility] back to [facility], today is the last day to turn in paperwork please reach out.

## 2023-10-07 NOTE — Progress Notes (Signed)
   Psychometrician Note   Cognitive testing was administered to Tiffany Benjamin by Lonell Jude, B.S. (psychometrist) under the supervision of Dr. Arthea KYM Maryland, Ph.D., licensed psychologist on 10/07/2023. [name] did not appear overtly distressed by the testing session per behavioral observation or responses across self-report questionnaires. Rest breaks were offered.    The battery of tests administered was selected by Dr. Zachary C. Merz, Ph.D. with consideration to [name]'s current level of functioning, the nature of her symptoms, emotional and behavioral responses during interview, level of literacy, observed level of motivation/effort, and the nature of the referral question. This battery was communicated to the psychometrist. Communication between Dr. Arthea KYM Maryland, Ph.D. and the psychometrist was ongoing throughout the evaluation and Dr. Arthea KYM Maryland, Ph.D. was immediately accessible at all times. Dr. Arthea KYM Maryland, Ph.D. provided supervision to the psychometrist on the date of this service to the extent necessary to assure the quality of all services provided.    [name] will return within approximately 1-2 weeks for an interactive feedback session with Dr. Maryland at which time her test performances, clinical impressions, and treatment recommendations will be reviewed in detail. [name] understands she can contact our office should she require our assistance before this time.  A total of 110 minutes of billable time were spent face-to-face with [name] by the psychometrist. This includes both test administration and scoring time. Billing for these services is reflected in the clinical report generated by Dr. Arthea KYM Maryland, Ph.D.  This note reflects time spent with the psychometrician and does not include test scores or any clinical interpretations made by Dr. Maryland. The full report will follow in a separate note.

## 2023-10-07 NOTE — Progress Notes (Signed)
 NEUROPSYCHOLOGICAL EVALUATION [place]. [facility] [place] Department of Neurology  Date of Evaluation: October 07, 2023  Reason for Referral:   Tiffany Benjamin is a 89 y.o. right-handed African-American female referred by Camie Sevin, PA-C, to characterize her current cognitive functioning and assist with diagnostic clarity and treatment planning in the context of subjective cognitive decline.   Assessment and Plan:   Clinical Impression(s): Tiffany Benjamin pattern of performance is suggestive of severe impairment surrounding all aspects of learning and memory. Additional impairments were exhibited across processing speed, cognitive flexibility, and an isolated line orientation task, while performance variability was exhibited across confrontation naming. Performances were appropriate relative to age-matched peers across basic attention, receptive language, verbal fluency, and all other aspects of visuospatial abilities. Functionally, Tiffany Benjamin's family is largely in charge of medication management, financial management, and bill paying. She also attends a senior day center several days per week. Given evidence for cognitive impairment and the likelihood that this is directly interfering with instrumental activities of daily living (ADLs), I believe she best meets diagnostic criteria for a Major Neurocognitive Disorder (dementia) at the present time.  Regarding the cause of her dementia presentation, I have primary concerns surrounding underlying Alzheimer's disease. Tiffany Benjamin did not benefit from repeated exposure to novel information across learning trials, was fully amnestic (i.e., 0% retention) across all memory tasks after a brief delay, and responded poorly across yes/no recognition trials. Taken together, this suggests evidence for rapid forgetting and a pronounced storage impairment, both of which are the hallmark testing patterns of Alzheimer's disease. Further  variability surrounding confrontation naming and weakness surrounding cognitive flexibility would follow fairly typical disease trajectory. Continued medical monitoring will be important moving forward.   Medical records suggest a prior vascular dementia diagnosis. I am unsure where this diagnosis originated from. Tiffany Benjamin has no stroke history and recent neuroimaging suggested mild for age microvascular ischemic changes. Given these variables, I cannot find objective evidence to warrant a vascular dementia diagnosis. Patterns across current cognitive testing also do not favor a vascular dementia presentation.   Recommendations: Tiffany Benjamin has already been prescribed a medication aimed to address memory loss and concerns surrounding Alzheimer's disease (i.e., memantine /Namenda ). She is encouraged to continue taking this medication as prescribed. It is important to highlight that this medication has been shown to slow functional decline in some individuals. There is no current treatment which can stop or reverse cognitive decline when caused by a neurodegenerative illness.   Performance across neurocognitive testing is not a strong predictor of an individual's safety operating a motor vehicle. With that being said, she did exhibit impairment across processing speed and cognitive flexibility. I would recommend that her family pursue a formalized driving evaluation to better inform safety behind the wheel. Should they desire to do this, they could reach out to the following agencies: [employer] in [place]: [phone] [facility]: [phone] [facility]: [phone] [facility]: [phone] or [phone]  It will be important for Tiffany Benjamin to have another person with her when in situations where she may need to process information, weigh the pros and cons of different options, and make decisions, in order to ensure that she fully understands  and recalls all information to be considered.  If not already done, Tiffany Benjamin and her family may want to discuss her wishes regarding durable power of attorney and medical decision making, so that she can have input into these choices. If they require legal assistance  with this, long-term care resource access, or other aspects of estate planning, they could reach out to [employer] at [phone] for a free consultation. Additionally, they may wish to discuss future plans for caretaking and seek out community options for in home/residential care should they become necessary.  Tiffany Benjamin is encouraged to attend to lifestyle factors for brain health (e.g., regular physical exercise, good nutrition habits and consideration of the MIND-DASH diet, regular participation in cognitively-stimulating activities, and general stress management techniques), which are likely to have benefits for both emotional adjustment and cognition. Optimal control of vascular risk factors (including safe cardiovascular exercise and adherence to dietary recommendations) is encouraged. Continued participation in activities which provide mental stimulation and social interaction is also recommended.   Important information should be provided to Tiffany Benjamin in written format in all instances. This information should be placed in a highly frequented and easily visible location within her home to promote recall. External strategies such as written notes in a consistently used memory journal, visual and nonverbal auditory cues such as a calendar on the refrigerator or appointments with alarm, such as on a cell phone, can also help maximize recall.  To address problems with processing speed, she may wish to consider:   -Ensuring that she is alerted when essential material or instructions are being presented   -Adjusting the speed at which new information is presented   -Allowing for more time in comprehending, processing, and  responding in conversation   -Repeating and paraphrasing instructions or conversations aloud  To address problems with fluctuating attention and/or executive dysfunction, she may wish to consider:   -Avoiding external distractions when needing to concentrate   -Limiting exposure to fast paced environments with multiple sensory demands   -Writing down complicated information and using checklists   -Attempting and completing one task at a time (i.e., no multi-tasking)   -Verbalizing aloud each step of a task to maintain focus   -Taking frequent breaks during the completion of steps/tasks to avoid fatigue   -Reducing the amount of information considered at one time   -Scheduling more difficult activities for a time of day where she is usually most alert  Review of Records:   Ms. Lapka was seen by [facility] Jayson Sevin, PA-C) on 07/04/2023 for an evaluation of memory loss. At that time, difficulties were said to be present for the past year. Examples surrounded trouble recalling new information, details of recent conversations, and names of individuals. Functionally, her family is involved in medication and financial management. She attends a senior day center several times per week for social and cognitive stimulation. Performance on a brief cognitive screening instrument (MOCA) was 14/30. Ultimately, Ms. Trueheart was referred for a comprehensive neuropsychological evaluation to characterize her cognitive abilities and to assist with diagnostic clarity and treatment planning.   Neuroimaging: Brain MRI on 08/17/2023 was largely unremarkable and suggested only mild for age microvascular ischemic disease.   Past Medical History:  Diagnosis Date   Arthritis    knees   Chest pain 12/20/2020   Chronic kidney disease, stage 3b 06/29/2020   Elevated blood pressure reading in office with white coat syndrome, with diagnosis of hypertension    Facial laceration 08/30/2023   Family history of  polyps in the colon    Fatigue 08/12/2020   Generalized anxiety disorder 07/29/2018   History of chicken pox    History of colonic polyps    Hyperlipidemia    Hypertension    Knee pain,  bilateral 10/04/2013   Major depressive disorder    Obesity 02/22/2015   Proximal muscle weakness 06/26/2019   Tricompartment osteoarthritis of knee 03/05/2014   Ulcer    UTI (urinary tract infection)     Past Surgical History:  Procedure Laterality Date   BREAST SURGERY Right 2001   bernign lumpectomy    CATARACT EXTRACTION W/PHACO Left 12/10/2019   Procedure: CATARACT EXTRACTION PHACO AND INTRAOCULAR LENS PLACEMENT (IOC) LEFT;  Surgeon: Myrna Adine Anes, MD;  Location: [facility];  Service: Ophthalmology;  Laterality: Left;  CDE 3.55 U/S 0:45.9   CATARACT EXTRACTION W/PHACO Right 12/31/2019   Procedure: CATARACT EXTRACTION PHACO AND INTRAOCULAR LENS PLACEMENT (IOC) RIGHT;  Surgeon: Myrna Adine Anes, MD;  Location: [facility];  Service: Ophthalmology;  Laterality: Right;  4.11 0:40.1    Current Outpatient Medications:    amLODipine  (NORVASC ) 10 MG tablet, Take 1 tablet (10 mg total) by mouth daily. IN THE MORNING FOR HYPERTENSION, Disp: 90 tablet, Rfl: 1   aspirin 81 MG tablet, Take 81 mg by mouth daily., Disp: , Rfl:    busPIRone  (BUSPAR ) 15 MG tablet, Take 1 tablet (15 mg total) by mouth 3 (three) times daily., Disp: 270 tablet, Rfl: 1   cholecalciferol (VITAMIN D3) 25 MCG (1000 UNIT) tablet, Take 1,000 Units by mouth daily., Disp: , Rfl:    Cyanocobalamin  (VITAMIN B-12 PO), Take by mouth daily., Disp: , Rfl:    hydrochlorothiazide  (HYDRODIURIL ) 25 MG tablet, Take 1 tablet (25 mg total) by mouth daily. In the morning for hypertension, Disp: 90 tablet, Rfl: 1   memantine  (NAMENDA ) 5 MG tablet, TAKE 1 TABLET (5 MG AT NIGHT) FOR 2 WEEKS, THEN INCREASE TO 1 TABLET (5 MG) TWICE A DAY, Disp: 180 tablet, Rfl: 2   metoprolol  succinate (TOPROL -XL) 50 MG 24 hr tablet, Take 1 tablet (50 mg  total) by mouth at bedtime. FOR HYPERTENSION, Disp: 90 tablet, Rfl: 1   Multiple Vitamins-Minerals (CENTRUM SILVER PO), Take by mouth daily., Disp: , Rfl:    sertraline  (ZOLOFT ) 25 MG tablet, Take 1 tablet (25 mg total) by mouth daily., Disp: 90 tablet, Rfl: 1   simvastatin  (ZOCOR ) 20 MG tablet, Take 1 tablet (20 mg total) by mouth daily., Disp: 90 tablet, Rfl: 1   telmisartan  (MICARDIS ) 40 MG tablet, TAKE 1 TABLET BY MOUTH EVERYDAY AT BEDTIME, Disp: 90 tablet, Rfl: 1  Clinical Interview:   The following information was obtained during a clinical interview with Ms. Briney and her son prior to cognitive testing.  Cognitive Symptoms: Decreased short-term memory: Denied. Her son alluded to greater memory concerns held by himself and other family members. Examples surrounded concerns for rapid forgetting, especially details of recent conversations and names. Per her son, difficulties have been present for at least the past year or so and do seem to have gradually worsened over time.  Decreased long-term memory: Denied. Decreased attention/concentration: Denied. Reduced processing speed: Denied. Difficulties with executive functions: Denied. Difficulties with emotion regulation: Denied. Difficulties with receptive language: Denied. Difficulties with word finding: Denied. Decreased visuoperceptual ability: Denied.  Difficulties completing ADLs: Endorsed. Tiffany Benjamin receives family assistance with medication management, financial management, and bill paying responsibilities. She continues to drive to very local and familiar locations without reported issue.   Additional Medical History: History of traumatic brain injury/concussion: Denied. History of stroke: Denied. History of seizure activity: Denied. History of known exposure to toxins: Denied. Symptoms of chronic pain: Denied. Experience of frequent headaches/migraines: Denied. Frequent instances of dizziness/vertigo: Denied outside  of  symptoms brought on by blood pressure elevations.   Sensory changes: She utilizes glasses to read and while driving with benefit. Other sensory changes/difficulties (e.g., hearing, taste, smell) were denied.  Balance/coordination difficulties: Denied. She also denied any recent falls.  Other motor difficulties: Denied.  Sleep History: Estimated hours obtained each night: 8 hours.  Difficulties falling asleep: Denied. Difficulties staying asleep: Denied. Feels rested and refreshed upon awakening: Endorsed.  History of snoring: Denied. History of waking up gasping for air: Denied. Witnessed breath cessation while asleep: Denied.  History of vivid dreaming: Denied. Excessive movement while asleep: Denied. Instances of acting out her dreams: Denied.  Psychiatric/Behavioral Health History: Depression: She described her current mood as fine and denied to her knowledge any prior mental health concerns or formal diagnoses. Current or remote suicidal ideation, intent, or plan was denied.  Anxiety: Denied. Mania: Denied. Trauma History: Denied. Visual/auditory hallucinations: Denied. Delusional thoughts: Denied.  Tobacco: Denied. Alcohol: She denied current alcohol consumption as well as a history of problematic alcohol abuse or dependence.  Recreational drugs: Denied.  Family History: Problem Relation Age of Onset   Stroke Mother 8       cerebral hemorrhage   Hypertension Mother    Diabetes Mother    Heart disease Father    Heart attack Father 76   Dementia Father    Cancer Sister        pancreatic   Cancer Sister 53       retroperitoneal Ca removed, doing fine    Hyperlipidemia Son    Bone cancer Son    Diabetes Son    This information was confirmed by Ms. Bransfield.  Academic/Vocational History: Highest level of educational attainment: 14 years. She graduated from high school and completed a two year college program in respiratory therapy. She described herself as an  average student in academic settings. Math was noted as a likely relative weakness. History of developmental delay: Denied. History of grade repetition: Denied. Enrollment in special education courses: Denied. History of LD/ADHD: Denied.  Employment: Retired. 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   Evaluation Results:   Behavioral Observations: Ms. Freese was accompanied by her son, arrived to her appointment on time, and was appropriately dressed and groomed. She appeared alert. She ambulated slowly and with some stabilization provided by her son. Gross motor functioning appeared intact upon informal observation and no abnormal movements (e.g., tremors) were noted. Her affect was generally relaxed and positive. Spontaneous speech was fluent and word finding difficulties were not observed during the clinical interview. Thought processes were coherent, organized, and normal in content. Insight into her cognitive difficulties appeared poor and I fear that she does not have appropriate insight into the severity of ongoing memory impairments.   During testing, sustained attention was appropriate. Task engagement was adequate and she persisted when challenged. She fatigued as the evaluation progressed. It was abbreviated in response. Overall, Ms. Chaires was cooperative with the clinical interview and subsequent testing procedures.   Adequacy of Effort: The validity of neuropsychological testing is limited by the extent to which the individual being tested may be assumed to have exerted adequate effort during testing. Ms. Vetrano expressed her intention to perform to the best of her abilities and exhibited adequate task engagement and persistence. Scores across stand-alone and embedded performance validity measures were variable. However, her sole below expectation performance is believed to be due to true and severe memory impairment rather than poor engagement or  any  attempts to perform poorly. As such, the results of the current evaluation are believed to be a valid representation of Ms. Goodpasture's current cognitive functioning.  Test Results: Ms. Laurel was poorly oriented at the time of the current evaluation. She incorrectly stated her age (21). She was also unable to state the current year (2024), month, date, day of the week, or name of the current clinic.  Intellectual abilities based upon educational and vocational attainment were estimated to be in the average range. Premorbid abilities were estimated to be within the below average range based upon a single-word reading test.   Processing speed was exceptionally low. Basic attention was well above average. More complex attention (e.g., working memory) was unable to be assessed due to increasing fatigue. Cognitive flexibility was well below average. Other aspects of executive functioning were unable to be assessed.  Receptive language abilities were unable to be assessed directly. However, Ms. Levingston did not exhibit prominent difficulties comprehending task instructions and answered all questions asked of her appropriately. Assessed expressive language was mildly variable. Phonemic fluency was below average, semantic fluency was average, and confrontation naming was average across a screening task but well below average across a more comprehensive task   Assessed visuospatial/visuoconstructional abilities were average to well above average outside of an impaired performance across a line orientation task.    Learning (i.e., encoding) of novel verbal information was well below average. Spontaneous delayed recall (i.e., retrieval) of previously learned information was exceptionally low. Retention rates were 0% across a list learning task, 0% across a story learning task, and 0% across a figure drawing task. Performance across recognition tasks was exceptionally low to well below average, suggesting  negligible evidence for information consolidation.   Results of emotional screening instruments suggested that recent symptoms of generalized anxiety were in the minimal range, while symptoms of depression were within normal limits. A screening instrument assessing recent sleep quality suggested the presence of minimal sleep dysfunction.  Tables of Scores:   Note: This summary of test scores accompanies the interpretive report and should not be considered in isolation without reference to the appropriate sections in the text. Descriptors are based on appropriate normative data and may be adjusted based on clinical judgment. Terms such as Within Normal Limits and Outside Normal Limits are used when a more specific description of the test score cannot be determined.       Percentile - Normative Descriptor > 98 - Exceptionally High 91-97 - Well Above Average 75-90 - Above Average 25-74 - Average 9-24 - Below Average 2-8 - Well Below Average < 2 - Exceptionally Low       Validity:   DESCRIPTOR       DCT: --- --- Within Normal Limits  RBANS EI: --- --- Outside Normal Limits       Orientation:      Raw Score Percentile   NAB Orientation, Form 1 19/29 --- ---       Cognitive Screening:      Raw Score Percentile   SLUMS: 11/30 --- ---       RBANS, Form A: Standard Score/ Scaled Score Percentile   Total Score 74 4 Well Below Average  Immediate Memory 69 2 Well Below Average    List Learning 5 5 Well Below Average    Story Memory 4 2 Well Below Average  Visuospatial/Constructional 92 30 Average    Figure Copy 14 91 Well Above Average    Line Orientation 9/20 <2 Exceptionally  Low  Language 107 68 Average    Picture Naming 9/10 26-50 Average    Semantic Fluency 11 63 Average  Attention 88 21 Below Average    Digit Span 15 95 Well Above Average    Coding 1 <1 Exceptionally Low  Delayed Memory 40 <1 Exceptionally Low    List Recall 0/10 <2 Exceptionally Low    List Recognition  11/20 <2 Exceptionally Low    Story Recall 1 <1 Exceptionally Low    Story Recognition 5/12 1-4 Exceptionally Low to  Well Below Average    Figure Recall 1 <1 Exceptionally Low    Figure Recognition 0/8 <1 Exceptionally Low        Intellectual Functioning:      Standard Score Percentile   Test of Premorbid Functioning: 86 18 Below Average       Attention/Executive Function:     Trail Making Test (TMT): Raw Score (T Score) Percentile     Part A 157 secs.,  0 errors (29) 2 Exceptionally Low    Part B 351 secs.,  3 errors (30) 2 Well Below Average        Language:     Verbal Fluency Test: Raw Score (Scaled Score) Percentile     Phonemic Fluency (CFL) 22 (7) 16 Below Average    Category Fluency 36 (10) 50 Average  *Based on [employer] Older Normative Studies (MOANS)          NAB Language Module, Form 1: T Score Percentile     Naming 24/31 (34) 5 Well Below Average       Visuospatial/Visuoconstruction:      Raw Score Percentile   Clock Drawing: 8/10 --- Within Normal Limits       Mood and Personality:      Raw Score Percentile   Geriatric Depression Scale: 5 --- Within Normal Limits  Geriatric Anxiety Scale: 9 --- Minimal    Somatic 2 --- Minimal    Cognitive 2 --- Minimal    Affective 5 --- Mild       Additional Questionnaires:      Raw Score Percentile   PROMIS Sleep Disturbance Questionnaire: 9 --- None to Slight   Informed Consent and Coding/Compliance:   The current evaluation represents a clinical evaluation for the purposes previously outlined by the referral source and is in no way reflective of a forensic evaluation.   Ms. Blickenstaff was provided with a verbal description of the nature and purpose of the present neuropsychological evaluation. Also reviewed were the foreseeable risks and/or discomforts and benefits of the procedure, limits of confidentiality, and mandatory reporting requirements of this provider. The patient was given the opportunity to ask questions and  receive answers about the evaluation. Oral consent to participate was provided by the patient.   This evaluation was conducted by Arthea KYM Maryland, Ph.D., ABPP-CN, board certified clinical neuropsychologist. Ms. Parkison completed a clinical interview with Dr. Maryland, billed as one unit [phone], and 110 minutes of cognitive testing and scoring, billed as one unit [phone] and three additional units [id number]. Psychometrist Lonell Jude, B.S. assisted Dr. Maryland with test administration and scoring procedures. As a separate and discrete service, one unit [id number] and two units [phone] were billed for Dr. Loralee time spent in interpretation and report writing.

## 2023-10-07 NOTE — Telephone Encounter (Signed)
 LMTCB. Need to let pt's daughter, Nicole Cella, know that pt's paperwork has been completed and faxed and her copy is ready for pick up.

## 2023-10-13 ENCOUNTER — Encounter: Payer: Medicare PPO | Admitting: Psychology

## 2023-10-17 ENCOUNTER — Ambulatory Visit: Payer: Medicare PPO | Admitting: Psychology

## 2023-10-17 DIAGNOSIS — F0280 Dementia in other diseases classified elsewhere without behavioral disturbance: Secondary | ICD-10-CM | POA: Diagnosis not present

## 2023-10-17 DIAGNOSIS — G309 Alzheimer's disease, unspecified: Secondary | ICD-10-CM

## 2023-10-17 NOTE — Progress Notes (Signed)
   Neuropsychology Feedback Session [name]  Reason for Referral:   Tiffany Benjamin is a 89 y.o. right-handed African-American female referred by Camie Sevin, PA-C, to characterize her current cognitive functioning and assist with diagnostic clarity and treatment planning in the context of subjective cognitive decline.   Feedback:   [name] completed a comprehensive neuropsychological evaluation on 10/07/2023. Please refer to that encounter for the full report and recommendations. Briefly, results suggested severe impairment surrounding all aspects of learning and memory. Additional impairments were exhibited across processing speed, cognitive flexibility, and an isolated line orientation task, while performance variability was exhibited across confrontation naming. Regarding the cause of her dementia presentation, I have primary concerns surrounding underlying Alzheimer's disease. [name] did not benefit from repeated exposure to novel information across learning trials, was fully amnestic (i.e., 0% retention) across all memory tasks after a brief delay, and responded poorly across yes/no recognition trials. Taken together, this suggests evidence for rapid forgetting and a pronounced storage impairment, both of which are the hallmark testing patterns of Alzheimer's disease. Further variability surrounding confrontation naming and weakness surrounding cognitive flexibility would follow fairly typical disease trajectory. Continued medical monitoring will be important moving forward.   [name] was accompanied by her son and daughter during the current feedback session. Content of the current session focused on the results of her neuropsychological evaluation. [name] was given the opportunity to ask questions and her questions were answered. She was encouraged to reach out should additional questions arise. A copy of her report was  provided at the conclusion of the visit.      [facility] [phone] was billed for Dr. Loralee time spent preparing for, conducting, and documenting the current feedback session with [name].

## 2023-11-17 ENCOUNTER — Telehealth: Payer: Self-pay

## 2023-11-17 NOTE — Telephone Encounter (Signed)
Spoke with pt's daughter to let her know that it will need to be at least 3 months from last injection and she stated that they did set the appt up so it would be 3 months later. Daughter also wanted to let you know that she thinks it is about time to start looking for pt some in home care and was just wanting to know how she would go about that. I advised the daughter that she would need to reach out to pt's insurance company to find out what all they will cover. Daughter stated that she will start that process.

## 2023-11-17 NOTE — Telephone Encounter (Signed)
This is the pt that her daughter, Mrs. Carlynn Spry reached out about to see if pt could have her knees injected again at the next office visit.

## 2023-11-30 ENCOUNTER — Encounter: Payer: Self-pay | Admitting: Internal Medicine

## 2023-11-30 ENCOUNTER — Ambulatory Visit: Payer: Medicare PPO | Admitting: Internal Medicine

## 2023-11-30 VITALS — BP 136/68 | HR 53 | Ht 62.0 in | Wt 159.4 lb

## 2023-11-30 DIAGNOSIS — M25561 Pain in right knee: Secondary | ICD-10-CM | POA: Diagnosis not present

## 2023-11-30 DIAGNOSIS — G8929 Other chronic pain: Secondary | ICD-10-CM

## 2023-11-30 DIAGNOSIS — M25562 Pain in left knee: Secondary | ICD-10-CM

## 2023-11-30 DIAGNOSIS — M170 Bilateral primary osteoarthritis of knee: Secondary | ICD-10-CM

## 2023-11-30 DIAGNOSIS — F0280 Dementia in other diseases classified elsewhere without behavioral disturbance: Secondary | ICD-10-CM

## 2023-11-30 DIAGNOSIS — G309 Alzheimer's disease, unspecified: Secondary | ICD-10-CM

## 2023-11-30 MED ORDER — LIDOCAINE HCL 1 % IJ SOLN
4.0000 mL | Freq: Once | INTRAMUSCULAR | Status: AC
  Administered 2023-11-30: 4 mL

## 2023-11-30 MED ORDER — SIMVASTATIN 20 MG PO TABS: 20.0000 mg | ORAL_TABLET | Freq: Every day | ORAL | 1 refills | Status: DC

## 2023-11-30 MED ORDER — AMLODIPINE BESYLATE 10 MG PO TABS: 10.0000 mg | ORAL_TABLET | Freq: Every day | ORAL | 1 refills | Status: DC

## 2023-11-30 MED ORDER — METOPROLOL SUCCINATE ER 50 MG PO TB24: 50.0000 mg | ORAL_TABLET | Freq: Every day | ORAL | 1 refills | Status: DC

## 2023-11-30 MED ORDER — BUSPIRONE HCL 15 MG PO TABS: 15.0000 mg | ORAL_TABLET | Freq: Three times a day (TID) | ORAL | 1 refills | Status: DC

## 2023-11-30 MED ORDER — HYDROCHLOROTHIAZIDE 25 MG PO TABS: 25.0000 mg | ORAL_TABLET | Freq: Every day | ORAL | 1 refills | Status: DC

## 2023-11-30 MED ORDER — TRIAMCINOLONE ACETONIDE 40 MG/ML IJ SUSP
40.0000 mg | Freq: Once | INTRAMUSCULAR | Status: AC
  Administered 2023-11-30: 40 mg via INTRAMUSCULAR

## 2023-11-30 MED ORDER — SERTRALINE HCL 25 MG PO TABS: 25.0000 mg | ORAL_TABLET | Freq: Every day | ORAL | 1 refills | Status: DC

## 2023-11-30 MED ORDER — TELMISARTAN 40 MG PO TABS: ORAL_TABLET | ORAL | 1 refills | Status: DC

## 2023-11-30 NOTE — Patient Instructions (Signed)
 PLEASE START TAKING TYLENOL ON A REGULAR BASIS IF YOU HAVE KNEE PAIN   You can  TAKE  up to 2000 mg of acetominophen (tylenol) every day safely  In divided doses (500 mg every 6 hours  Or 1000 mg every 12 hours.)

## 2023-11-30 NOTE — Progress Notes (Unsigned)
 Subjective:  Patient ID: Tiffany Benjamin, female    DOB: 12/30/1932  Age: 89 y.o. MRN: [id number]  CC: There were no encounter diagnoses.   HPI Tiffany Benjamin presents for  Chief Complaint  Patient presents with   Medical Management of Chronic Issues    3 month follow up     1) dementia:  patient lives alone,  next door to daughter Grier Rocher.  Nicole Cella has noticed that patient has not been showering or bathing regularly, despite patient claiming that she has.  Patient refuses to allow Nicole Cella to help her.  [place] is requesting a HH aide to assist with hygiene 3 times per week  2) Bilateral knee pain:   Outpatient Medications Prior to Visit  Medication Sig Dispense Refill   amLODipine (NORVASC) 10 MG tablet Take 1 tablet (10 mg total) by mouth daily. IN THE MORNING FOR HYPERTENSION 90 tablet 1   aspirin 81 MG tablet Take 81 mg by mouth daily.     busPIRone (BUSPAR) 15 MG tablet Take 1 tablet (15 mg total) by mouth 3 (three) times daily. 270 tablet 1   cholecalciferol (VITAMIN D3) 25 MCG (1000 UNIT) tablet Take 1,000 Units by mouth daily.     Cyanocobalamin (VITAMIN B-12 PO) Take by mouth daily.     hydrochlorothiazide (HYDRODIURIL) 25 MG tablet Take 1 tablet (25 mg total) by mouth daily. In the morning for hypertension 90 tablet 1   memantine (NAMENDA) 5 MG tablet TAKE 1 TABLET (5 MG AT NIGHT) FOR 2 WEEKS, THEN INCREASE TO 1 TABLET (5 MG) TWICE A DAY 180 tablet 2   metoprolol succinate (TOPROL-XL) 50 MG 24 hr tablet Take 1 tablet (50 mg total) by mouth at bedtime. FOR HYPERTENSION 90 tablet 1   Multiple Vitamins-Minerals (CENTRUM SILVER PO) Take by mouth daily.     sertraline (ZOLOFT) 25 MG tablet Take 1 tablet (25 mg total) by mouth daily. 90 tablet 1   simvastatin (ZOCOR) 20 MG tablet Take 1 tablet (20 mg total) by mouth daily. 90 tablet 1   telmisartan (MICARDIS) 40 MG tablet TAKE 1 TABLET BY MOUTH EVERYDAY AT BEDTIME 90 tablet 1   No facility-administered medications  prior to visit.    Review of Systems;  Patient denies headache, fevers, malaise, unintentional weight loss, skin rash, eye pain, sinus congestion and sinus pain, sore throat, dysphagia,  hemoptysis , cough, dyspnea, wheezing, chest pain, palpitations, orthopnea, edema, abdominal pain, nausea, melena, diarrhea, constipation, flank pain, dysuria, hematuria, urinary  Frequency, nocturia, numbness, tingling, seizures,  Focal weakness, Loss of consciousness,  Tremor, insomnia, depression, anxiety, and suicidal ideation.      Objective:  BP 136/68   Pulse (!) 53   Ht 5\' 2"  (1.575 m)   Wt 159 lb 6.4 oz (72.3 kg)   SpO2 97%   BMI 29.15 kg/m   BP Readings from Last 3 Encounters:  11/30/23 136/68  08/30/23 (!) 150/76  08/17/23 128/68    Wt Readings from Last 3 Encounters:  11/30/23 159 lb 6.4 oz (72.3 kg)  08/30/23 158 lb 12.8 oz (72 kg)  07/24/23 153 lb (69.4 kg)    Physical Exam  Lab Results  Component Value Date   HGBA1C 5.6 06/08/2023   HGBA1C 5.7 07/15/2021   HGBA1C 5.7 02/07/2020    Lab Results  Component Value Date   CREATININE 1.24 (H) 06/08/2023   CREATININE 1.11 12/22/2022   CREATININE 1.29 (H) 06/02/2022    Lab Results  Component Value Date  WBC 5.2 12/22/2022   HGB 12.4 12/22/2022   HCT 37.4 12/22/2022   PLT 248.0 12/22/2022   GLUCOSE 81 06/08/2023   CHOL 189 06/08/2023   TRIG 83.0 06/08/2023   HDL 77.20 06/08/2023   LDLDIRECT 107.0 06/08/2023   LDLCALC 95 06/08/2023   ALT 13 06/08/2023   AST 17 06/08/2023   NA 143 06/08/2023   K 3.7 06/08/2023   CL 104 06/08/2023   CREATININE 1.24 (H) 06/08/2023   BUN 21 06/08/2023   CO2 30 06/08/2023   TSH 0.84 06/08/2023   HGBA1C 5.6 06/08/2023   MICROALBUR 6.4 (H) 06/08/2023    MR BRAIN WO CONTRAST Result Date: 08/17/2023 CLINICAL DATA:  Memory impairment EXAM: MRI HEAD WITHOUT CONTRAST TECHNIQUE: Multiplanar, multiecho pulse sequences of the brain and surrounding structures were obtained without  intravenous contrast. COMPARISON:  Head CT from the same date FINDINGS: Brain: Age congruent brain volume and morphology. No acute infarction, hemorrhage, hydrocephalus, extra-axial collection or mass lesion. Chronic small vessel ischemia and cerebral volume loss which is mild for age. Thin appearance of the optic chiasm, nonspecific. Vascular: Normal flow voids. Skull and upper cervical spine: Normal marrow signal. Sinuses/Orbits: Negative. IMPRESSION: Age congruent involutional changes. No specific or reversible cause for memory loss. Electronically Signed   By: Tiburcio Pea M.D.   On: 08/17/2023 06:25   CT HEAD WO CONTRAST ( ) Result Date: 07/24/2023 CLINICAL DATA:  Recent fall with headaches and neck pain, initial encounter EXAM: CT HEAD WITHOUT CONTRAST CT CERVICAL SPINE WITHOUT CONTRAST TECHNIQUE: Multidetector CT imaging of the head and cervical spine was performed following the standard protocol without intravenous contrast. Multiplanar CT image reconstructions of the cervical spine were also generated. RADIATION DOSE REDUCTION: This exam was performed according to the departmental dose-optimization program which includes automated exposure control, adjustment of the mA and/or kV according to patient size and/or use of iterative reconstruction technique. COMPARISON:  None Available. FINDINGS: CT HEAD FINDINGS Brain: No evidence of acute infarction, hemorrhage, hydrocephalus, extra-axial collection or mass lesion/mass effect. Mild atrophic and chronic white matter ischemic changes are noted. Vascular: No hyperdense vessel or unexpected calcification. Skull: Normal. Negative for fracture or focal lesion. Sinuses/Orbits: No acute finding. Other: None. CT CERVICAL SPINE FINDINGS Alignment: Within normal limits. Skull base and vertebrae: 7 cervical segments are well visualized. Vertebral body height is well maintained. Osteophytic changes are noted from C5-C7. Multilevel facet hypertrophic changes are  noted. No acute fracture or acute facet abnormality is noted. The odontoid is within normal limits. Soft tissues and spinal canal: Surrounding soft tissue structures are within normal limits. No focal hematoma is noted. Upper chest: Visualized lung apices are unremarkable. Other: None IMPRESSION: CT of the head: Chronic atrophic and ischemic changes without acute abnormality. CT of the cervical spine: Multilevel degenerative change without acute abnormality. Electronically Signed   By: Alcide Clever M.D.   On: 07/24/2023 18:04   CT Cervical Spine Wo Contrast Result Date: 07/24/2023 CLINICAL DATA:  Recent fall with headaches and neck pain, initial encounter EXAM: CT HEAD WITHOUT CONTRAST CT CERVICAL SPINE WITHOUT CONTRAST TECHNIQUE: Multidetector CT imaging of the head and cervical spine was performed following the standard protocol without intravenous contrast. Multiplanar CT image reconstructions of the cervical spine were also generated. RADIATION DOSE REDUCTION: This exam was performed according to the departmental dose-optimization program which includes automated exposure control, adjustment of the mA and/or kV according to patient size and/or use of iterative reconstruction technique. COMPARISON:  None Available. FINDINGS: CT HEAD FINDINGS Brain:  No evidence of acute infarction, hemorrhage, hydrocephalus, extra-axial collection or mass lesion/mass effect. Mild atrophic and chronic white matter ischemic changes are noted. Vascular: No hyperdense vessel or unexpected calcification. Skull: Normal. Negative for fracture or focal lesion. Sinuses/Orbits: No acute finding. Other: None. CT CERVICAL SPINE FINDINGS Alignment: Within normal limits. Skull base and vertebrae: 7 cervical segments are well visualized. Vertebral body height is well maintained. Osteophytic changes are noted from C5-C7. Multilevel facet hypertrophic changes are noted. No acute fracture or acute facet abnormality is noted. The odontoid is  within normal limits. Soft tissues and spinal canal: Surrounding soft tissue structures are within normal limits. No focal hematoma is noted. Upper chest: Visualized lung apices are unremarkable. Other: None IMPRESSION: CT of the head: Chronic atrophic and ischemic changes without acute abnormality. CT of the cervical spine: Multilevel degenerative change without acute abnormality. Electronically Signed   By: Alcide Clever M.D.   On: 07/24/2023 18:04    Assessment & Plan:  .There are no diagnoses linked to this encounter.   I spent 34 minutes on the day of this face to face encounter reviewing patient's  most recent visit with cardiology,  nephrology,  and neurology,  prior relevant surgical and non surgical procedures, recent  labs and imaging studies, counseling on weight management,  reviewing the assessment and plan with patient, and post visit ordering and reviewing of  diagnostics and therapeutics with patient  .   Follow-up: No follow-ups on file.   Sherlene Shams, MD

## 2023-12-01 NOTE — Assessment & Plan Note (Signed)
 After obtaining informed consent for  an I/A injection of the left  knee,  The rleft knee was cleaned with betadine and alcohol.  Topical anesthetic was sprayed on medial side of  the left patella and 1 ml (40 mg)  Kenalog mixed with 4 ml 1% lidocaine was injected without difficulty into the bursa,  Patient tolerated the procedure without complications or bleeding.  Patient was advised to apply ice for 15 minutes every few hours for the first 24 hours and to avoid strenuous activity for up to 7 days     After obtaining informed consent for  an I/A injection of the right knee,  The right knee was cleaned with betadine and alcohol.  Topical anesthetic was sprayed on medial side of  the right  patella and  1 ml  (40 mg ) Kenalog mixed with 4 ml 1% lidocaine was injected without difficulty into the bursa,  Patient tolerated the procedure without complications or bleeding.  Patient was advised to apply ice for 15 minutes every few hours for the first 24 hours and to avoid strenuous activity for up to 7 days

## 2023-12-01 NOTE — Assessment & Plan Note (Signed)
 She requires assistance at home to maintain g. ood hygiene.  I am recommending an aide 3 times per week for 2-3 hours

## 2024-01-02 ENCOUNTER — Telehealth: Payer: Self-pay

## 2024-01-02 DIAGNOSIS — L84 Corns and callosities: Secondary | ICD-10-CM

## 2024-01-02 NOTE — Telephone Encounter (Signed)
 Referral has been placed.

## 2024-01-25 ENCOUNTER — Ambulatory Visit: Payer: Self-pay | Admitting: Podiatry

## 2024-02-15 ENCOUNTER — Telehealth: Payer: Self-pay | Admitting: Physician Assistant

## 2024-02-15 ENCOUNTER — Ambulatory Visit: Payer: Medicare PPO | Admitting: Physician Assistant

## 2024-02-15 ENCOUNTER — Other Ambulatory Visit: Payer: Self-pay | Admitting: Physician Assistant

## 2024-02-15 NOTE — Telephone Encounter (Signed)
 Please call  Tiffany Benjamin she has questions about what will  happen during the appt

## 2024-02-15 NOTE — Telephone Encounter (Signed)
 She is coming in May 16,2025.

## 2024-02-15 NOTE — Telephone Encounter (Signed)
 They do not see a need in coming back, FYI. Wants to cancel appt with you. Okay to do so

## 2024-02-15 NOTE — Progress Notes (Incomplete)
 Assessment/Plan:   Dementia likely due to Alzheimer's Disease with anxiety  Tiffany Benjamin is a very pleasant 89 y.o. RH female with a history ofhypertension, hyperlipidemia, bradycardia,  arthritis, CKD 3B, anxiety and diagnosis of dementia likely due to Alzheimer disease, seen today in evaluation for memory loss.  Patient is on memantine  5 mg twice daily, tolerating well.  She is able to participate some ADLs.  No longer drives.   Initial visit 07/04/23 How long did patient have memory difficulties?  For the last year.  Her sister has moved into her home  since last December which allowed more stress and friction," appears to have affected the memory".  Patient has difficulty remembering new information, recent conversations and names of people.  She writes and likes to play solitaire on the phone. appointments in a calendar and writes things down and places it in the fridge door to remember.  She attends daycare  3 times a  for social and cognitive stimulation. repeats oneself?  Endorsed Disoriented when walking into a room?  Denies   Leaving objects in unusual places?   Denies. One time she left the purse in the laundry room.  Wandering behavior? Denies.   Any personality changes, or depression, anxiety?  Has moments of irritability, anxiety. Denies a history of depression.  Hallucinations or paranoia?  Denies. She always carries her pocketbook because my children will take my money.    Seizures? Denies.    Any sleep changes?  Sleeps well, denies vivid dreams, REM behavior or sleepwalking.   Sleep apnea? Denies.   Any hygiene concerns?  Denies.   Independent of bathing and dressing?  Endorsed  Who is in charge of the medications?  Patient is in charge. Daughter who is a Research scientist (life sciences) the medicines.  Who is in charge of the finances?  Daughter is in charge.      Any changes in appetite?   Denies. "I just don't eat enough"  Losing weight.      Patient have trouble swallowing?  Denies.    Does the patient cook?  No   Any headaches?  Denies.   Chronic back pain?  Denies.   Ambulates with difficulty?  Needs a walker to ambulate but has not been using it.  " I use a stationary bike in the garage"  Recent falls or head injuries? "I have fallen and hit my head years ago and  drove myself to the hospital and it wall alright"   Vision changes? Denies. Stroke like symptoms?  Denies.   Any tremors?  Denies.   Any anosmia?  Denies.   Any incontinence of urine? Denies.   Any bowel dysfunction? Denies.      Patient lives with her sister.  History of heavy alcohol intake? She drinks her glass of wine a day.  History of heavy tobacco use? Denies.   Family history of dementia?  Fa had dementia ?type.  Does patient drive? 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 seen today in follow up for memory loss. Patient is currently on .     Follow up in   months. Continue Memantine  10 mg twice daily. Side effects were discussed  Recommend good control of her cardiovascular risk factors Continue to control mood as per PCP     Subjective:    This patient is accompanied in the office by her daughter*** who  supplements the history.  Previous records as well as any outside records available were reviewed prior to todays visit. Patient was last seen on, with [facility] on September 2024 of 14/30***   Any changes in memory since last visit? "  She continues to have some difficulty remembering information and recent conversations, names of people.  She likes to write, especially appointments, thinks that she may not remember later and places that on the fridge door, likes to play solitaire on the phone.  She lost going to the [employer] 3 times a week. repeats oneself?  Endorsed Disoriented when walking into a room?  Patient denies ***  Leaving objects?  May misplace things but not in unusual places***   Wandering behavior?  denies   Any personality changes since last visit?  As before, she has moments of distractibility and anxiety.  Denies depression. Any worsening depression?:  Denies.   Hallucinations or paranoia?  Denies hallucinations, but she is paranoid about her pocketbook "because my children will take my money ^".   Seizures? denies    Any sleep changes?  Denies vivid dreams, REM behavior or sleepwalking   Sleep apnea?   Denies.   Any hygiene concerns? Denies.  Independent of bathing and dressing?  Endorsed  Does the patient needs help with medications?  Patient is in charge with her daughter prepares and on the pillbox (she is an Charity fundraiser)*** Who is in charge of the finances?  Daughter is in charge   *** Any changes in appetite?  denies ***   Patient have trouble swallowing? Denies.   Does the patient cook? No Any headaches?   denies   Chronic back pain  denies   Ambulates with difficulty?  She has arthritis which may limit her mobility.  She does not like to use the walker for stability and to prevent falls.  She continues to use her stationary bike*** Recent falls or head injuries? denies     Unilateral weakness, numbness or tingling? denies   Any tremors?  Denies   Any anosmia?  Denies   Any incontinence of urine?  Endorsed***, wears diapers Any bowel dysfunction?   Denies      Patient lives with her sister*** Does the patient drive? No longer drives ***  Neuropsych evaluation 10/07/2023 Briefly, results suggested severe impairment surrounding all aspects of learning and memory. Additional impairments were exhibited across processing speed, cognitive flexibility, and an isolated line orientation task, while performance variability was exhibited across confrontation naming. Regarding the cause of her dementia presentation, I have primary concerns surrounding underlying Alzheimer's disease. [name] did not benefit from repeated exposure to novel information across learning trials,  was fully amnestic (i.e., 0% retention) across all memory tasks after a brief delay, and responded poorly across yes/no recognition trials. Taken together, this suggests evidence for rapid forgetting and a pronounced storage impairment, both of which are the hallmark testing patterns of Alzheimer's disease. Further variability surrounding confrontation naming and weakness surrounding cognitive flexibility would follow fairly typical disease trajectory. Continued medical monitoring will be important moving forward.       Initial visit 07/04/23 How long did patient have memory difficulties?  For the last year.  Her sister has moved into her home  since last December which allowed more stress and friction," appears to have affected the memory".  Patient has difficulty remembering new information, recent conversations and names of people.  She writes and likes to play solitaire on the phone. appointments in a calendar and writes  things down and places it in the fridge door to remember.  She attends daycare  3 times a  for social and cognitive stimulation. repeats oneself?  Endorsed Disoriented when walking into a room?  Denies   Leaving objects in unusual places?   Denies. One time she left the purse in the laundry room.  Wandering behavior? Denies.   Any personality changes, or depression, anxiety?  Has moments of irritability, anxiety. Denies a history of depression.  Hallucinations or paranoia?  Denies. She always carries her pocketbook because my children will take my money.    Seizures? Denies.    Any sleep changes?  Sleeps well, denies vivid dreams, REM behavior or sleepwalking.   Sleep apnea? Denies.   Any hygiene concerns?  Denies.   Independent of bathing and dressing?  Endorsed  Who is in charge of the medications?  Patient is in charge. Daughter who is a Research scientist (life sciences) the medicines.  Who is in charge of the finances?  Daughter is in charge.      Any changes in appetite?   Denies. "I just  don't eat enough"  Losing weight.      Patient have trouble swallowing?  Denies.   Does the patient cook?  No   Any headaches?  Denies.   Chronic back pain?  Denies.   Ambulates with difficulty?  Needs a walker to ambulate but has not been using it.  " I use a stationary bike in the garage"  Recent falls or head injuries? "I have fallen and hit my head years ago and  drove myself to the hospital and it wall alright"   Vision changes? Denies. Stroke like symptoms?  Denies.   Any tremors?  Denies.   Any anosmia?  Denies.   Any incontinence of urine? Denies.   Any bowel dysfunction? Denies.      Patient lives with her sister.  History of heavy alcohol intake? She drinks her glass of wine a day.  History of heavy tobacco use? Denies.   Family history of dementia?  Fa had dementia ?type.  Does patient drive? 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   MRI of the brain results personally reviewed, remarkable for mild chronic ischemic changes and mild atrophy with mils hippocampal atrophy.There is some volume loss. Radiology reports some chiasma thinning, patient denies any vision changes.  No acute findings.    PREVIOUS MEDICATIONS:   CURRENT MEDICATIONS:  Outpatient Encounter Medications as of 02/15/2024  Medication Sig   amLODipine  (NORVASC ) 10 MG tablet Take 1 tablet (10 mg total) by mouth daily. IN THE MORNING FOR HYPERTENSION   aspirin 81 MG tablet Take 81 mg by mouth daily.   busPIRone  (BUSPAR ) 15 MG tablet Take 1 tablet (15 mg total) by mouth 3 (three) times daily.   cholecalciferol (VITAMIN D3) 25 MCG (1000 UNIT) tablet Take 1,000 Units by mouth daily.   Cyanocobalamin  (VITAMIN B-12 PO) Take by mouth daily.   hydrochlorothiazide  (HYDRODIURIL ) 25 MG tablet Take 1 tablet (25 mg total) by mouth daily. In the morning for hypertension   memantine  (NAMENDA ) 5 MG tablet TAKE 1 TABLET (5 MG  AT NIGHT) FOR 2 WEEKS, THEN INCREASE TO 1 TABLET (5 MG) TWICE A DAY   metoprolol  succinate (TOPROL -XL) 50 MG 24 hr tablet Take 1 tablet (50 mg total) by mouth at bedtime. FOR HYPERTENSION   Multiple Vitamins-Minerals (CENTRUM  SILVER PO) Take by mouth daily.   sertraline  (ZOLOFT ) 25 MG tablet Take 1 tablet (25 mg total) by mouth daily.   simvastatin  (ZOCOR ) 20 MG tablet Take 1 tablet (20 mg total) by mouth daily.   telmisartan  (MICARDIS ) 40 MG tablet TAKE 1 TABLET BY MOUTH EVERYDAY AT BEDTIME   No facility-administered encounter medications on file as of 02/15/2024.       03/10/2016    9:16 AM  MMSE - Mini Mental State Exam  Orientation to time 5  Orientation to Place 5  Registration 3  Attention/ Calculation 5  Recall 3  Language- name 2 objects 2  Language- repeat 1  Language- follow 3 step command 3  Language- read & follow direction 1  Write a sentence 1  Copy design 1  Total score 30      07/04/2023   12:00 PM  Montreal Cognitive Assessment   Visuospatial/ Executive (0/5) 3  Naming (0/3) 2  Attention: Read list of digits (0/2) 2  Attention: Read list of letters (0/1) 1  Attention: Serial 7 subtraction starting at 100 (0/3) 0  Language: Repeat phrase (0/2) 2  Language : Fluency (0/1) 1  Abstraction (0/2) 2  Delayed Recall (0/5) 1  Orientation (0/6) 0  Total 14  Adjusted Score (based on education) 14    Objective:     PHYSICAL EXAMINATION:    VITALS:  There were no vitals filed for this visit.  GEN:  The patient appears stated age and is in NAD. HEENT:  Normocephalic, atraumatic.   Neurological examination:  General: NAD, well-groomed, appears stated age. Orientation: The patient is alert. Oriented to person, not place and date Cranial nerves: There is good facial symmetry.The speech is fluent and clear. No aphasia or dysarthria. Fund of knowledge is appropriate. Recent and remote memory are impaired. Attention and concentration are reduced.  Able to name  objects and able to repeat phrases.  Hearing is intact to conversational tone. *** Sensation: Sensation is intact to light touch throughout Motor: Strength is at least antigravity x4. DTR's 2/4 in UE/LE     Movement examination: Tone: There is normal tone in the UE/LE Abnormal movements:  no tremor.  No myoclonus.  No asterixis.   Coordination:  There is no decremation with RAM's. Normal finger to nose  Gait and Station: The patient some difficulty arising out of a deep-seated chair without the use of the hands.  Her knees are being and (due to arthritis).  She needs a walker to ambulate.  The patient's stride length is good.  Gait is cautious and narrow.    Thank you for allowing us  the opportunity to participate in the care of this nice patient. Please do not hesitate to contact us  for any questions or concerns.   Total time spent on today's visit was *** minutes dedicated to this patient today, preparing to see patient, examining the patient, ordering tests and/or medications and counseling the patient, documenting clinical information in the EHR or other health record, independently interpreting results and communicating results to the patient/family, discussing treatment and goals, answering patient's questions and coordinating care.  Cc:  Thersia Flax, MD  Tex Filbert 02/15/2024 5:45 AM

## 2024-02-17 ENCOUNTER — Ambulatory Visit: Admitting: Physician Assistant

## 2024-03-24 ENCOUNTER — Other Ambulatory Visit: Payer: Self-pay | Admitting: Internal Medicine

## 2024-03-26 NOTE — Telephone Encounter (Signed)
 Medication was discontinued on 12/21/2022. Is it okay to refuse refill request?

## 2024-04-18 ENCOUNTER — Encounter: Payer: Self-pay | Admitting: Internal Medicine

## 2024-04-18 ENCOUNTER — Ambulatory Visit: Admitting: Internal Medicine

## 2024-04-18 VITALS — BP 142/64 | HR 57 | Ht 62.0 in | Wt 164.0 lb

## 2024-04-18 DIAGNOSIS — G309 Alzheimer's disease, unspecified: Secondary | ICD-10-CM

## 2024-04-18 DIAGNOSIS — F0280 Dementia in other diseases classified elsewhere without behavioral disturbance: Secondary | ICD-10-CM

## 2024-04-18 DIAGNOSIS — R7301 Impaired fasting glucose: Secondary | ICD-10-CM

## 2024-04-18 DIAGNOSIS — R5383 Other fatigue: Secondary | ICD-10-CM

## 2024-04-18 DIAGNOSIS — E785 Hyperlipidemia, unspecified: Secondary | ICD-10-CM

## 2024-04-18 DIAGNOSIS — M170 Bilateral primary osteoarthritis of knee: Secondary | ICD-10-CM

## 2024-04-18 DIAGNOSIS — I10 Essential (primary) hypertension: Secondary | ICD-10-CM

## 2024-04-18 LAB — CBC WITH DIFFERENTIAL/PLATELET
Basophils Absolute: 0 K/uL (ref 0.0–0.1)
Basophils Relative: 0.7 % (ref 0.0–3.0)
Eosinophils Absolute: 0.2 K/uL (ref 0.0–0.7)
Eosinophils Relative: 3.3 % (ref 0.0–5.0)
HCT: 36.5 % (ref 36.0–46.0)
Hemoglobin: 12.3 g/dL (ref 12.0–15.0)
Lymphocytes Relative: 29.8 % (ref 12.0–46.0)
Lymphs Abs: 1.6 K/uL (ref 0.7–4.0)
MCHC: 33.6 g/dL (ref 30.0–36.0)
MCV: 97.1 fl (ref 78.0–100.0)
Monocytes Absolute: 0.6 K/uL (ref 0.1–1.0)
Monocytes Relative: 10.9 % (ref 3.0–12.0)
Neutro Abs: 2.9 K/uL (ref 1.4–7.7)
Neutrophils Relative %: 55.3 % (ref 43.0–77.0)
Platelets: 213 K/uL (ref 150.0–400.0)
RBC: 3.76 Mil/uL — ABNORMAL LOW (ref 3.87–5.11)
RDW: 15.1 % (ref 11.5–15.5)
WBC: 5.3 K/uL (ref 4.0–10.5)

## 2024-04-18 LAB — LDL CHOLESTEROL, DIRECT: Direct LDL: 69 mg/dL

## 2024-04-18 LAB — LIPID PANEL
Cholesterol: 176 mg/dL (ref 0–200)
HDL: 85.7 mg/dL (ref >39.00)
LDL Cholesterol: 73 mg/dL (ref 0–99)
NonHDL: 90.15
Total CHOL/HDL Ratio: 2
Triglycerides: 86 mg/dL (ref 0.0–149.0)
VLDL: 17.2 mg/dL (ref 0.0–40.0)

## 2024-04-18 LAB — COMPREHENSIVE METABOLIC PANEL WITH GFR
ALT: 14 U/L (ref 0–35)
AST: 20 U/L (ref 0–37)
Albumin: 4.3 g/dL (ref 3.5–5.2)
Alkaline Phosphatase: 77 U/L (ref 39–117)
BUN: 25 mg/dL — ABNORMAL HIGH (ref 6–23)
CO2: 33 meq/L — ABNORMAL HIGH (ref 19–32)
Calcium: 9.2 mg/dL (ref 8.4–10.5)
Chloride: 101 meq/L (ref 96–112)
Creatinine, Ser: 1.25 mg/dL — ABNORMAL HIGH (ref 0.40–1.20)
GFR: 38.25 mL/min — ABNORMAL LOW (ref >60.00)
Glucose, Bld: 84 mg/dL (ref 70–99)
Potassium: 4.1 meq/L (ref 3.5–5.1)
Sodium: 142 meq/L (ref 135–145)
Total Bilirubin: 0.7 mg/dL (ref 0.2–1.2)
Total Protein: 7.4 g/dL (ref 6.0–8.3)

## 2024-04-18 LAB — MICROALBUMIN / CREATININE URINE RATIO
Creatinine,U: 81.1 mg/dL
Microalb Creat Ratio: 57.5 mg/g — ABNORMAL HIGH (ref 0.0–30.0)
Microalb, Ur: 4.7 mg/dL — ABNORMAL HIGH (ref 0.0–1.9)

## 2024-04-18 LAB — HEMOGLOBIN A1C: Hgb A1c MFr Bld: 5.9 % (ref 4.6–6.5)

## 2024-04-18 LAB — TSH: TSH: 0.94 u[IU]/mL (ref 0.35–5.50)

## 2024-04-18 MED ORDER — AMLODIPINE BESYLATE 10 MG PO TABS: 10.0000 mg | ORAL_TABLET | Freq: Every day | ORAL | 1 refills | Status: DC

## 2024-04-18 MED ORDER — HYDROCHLOROTHIAZIDE 25 MG PO TABS: 25.0000 mg | ORAL_TABLET | Freq: Every day | ORAL | 1 refills | Status: DC

## 2024-04-18 MED ORDER — BUSPIRONE HCL 15 MG PO TABS: 15.0000 mg | ORAL_TABLET | Freq: Three times a day (TID) | ORAL | 1 refills | Status: DC

## 2024-04-18 MED ORDER — METOPROLOL SUCCINATE ER 50 MG PO TB24: 50.0000 mg | ORAL_TABLET | Freq: Every day | ORAL | 1 refills | Status: DC

## 2024-04-18 MED ORDER — SERTRALINE HCL 50 MG PO TABS: 50.0000 mg | ORAL_TABLET | Freq: Every day | ORAL | 1 refills | Status: DC

## 2024-04-18 MED ORDER — TELMISARTAN 40 MG PO TABS: ORAL_TABLET | ORAL | 1 refills | Status: DC

## 2024-04-18 MED ORDER — SIMVASTATIN 20 MG PO TABS: 20.0000 mg | ORAL_TABLET | Freq: Every day | ORAL | 1 refills | Status: DC

## 2024-04-18 NOTE — Patient Instructions (Addendum)
 You must take the maximum dose of tylenol  on a regular basis before I can prescribed anything stronger for your knee pain   You can take 3000 mg of acetominophen (tylenol ) every day safely  In divided doses (1000 mg every 8 hours.)   Once you are doing this,  I will be willing  to prescribe  tramadol  to use when the tylenol  is not enough   I have increased the dose of sertraline  to 50 mg daily.  This may help with your pain as well

## 2024-04-18 NOTE — Progress Notes (Unsigned)
 Subjective:  Patient ID: Tiffany Benjamin, female    DOB: 30-Jul-1933  Age: 89 y.o. MRN: [id number]  CC: The primary encounter diagnosis was Hyperlipidemia, unspecified hyperlipidemia type. Diagnoses of Fatigue, unspecified type, Essential hypertension, and Impaired fasting glucose were also pertinent to this visit.   HPI Tiffany Benjamin presents for  Chief Complaint  Patient presents with   Medical Management of Chronic Issues   Dementia with behavioral disorder  ;  daughter present; bearing the brunt of Tiffany Benjamin's irritability,  agitation, paranoia and refusal to pay for groceries or to bathe .  Patient states that she showers daily,  cleans her house by herself,  doesn't need any help.  States that Tiffany Benjamin is already paying for all groceries etc of of Tiffany Benjamin's  accounts.     Outpatient Medications Prior to Visit  Medication Sig Dispense Refill   amLODipine  (NORVASC ) 10 MG tablet Take 1 tablet (10 mg total) by mouth daily. IN THE MORNING FOR HYPERTENSION 90 tablet 1   aspirin 81 MG tablet Take 81 mg by mouth daily.     busPIRone  (BUSPAR ) 15 MG tablet Take 1 tablet (15 mg total) by mouth 3 (three) times daily. 270 tablet 1   cholecalciferol (VITAMIN D3) 25 MCG (1000 UNIT) tablet Take 1,000 Units by mouth daily.     Cyanocobalamin  (VITAMIN B-12 PO) Take by mouth daily.     hydrochlorothiazide  (HYDRODIURIL ) 25 MG tablet Take 1 tablet (25 mg total) by mouth daily. In the morning for hypertension 90 tablet 1   memantine  (NAMENDA ) 5 MG tablet TAKE 1 TABLET (5 MG AT NIGHT) FOR 2 WEEKS, THEN INCREASE TO 1 TABLET (5 MG) TWICE A DAY 180 tablet 2   metoprolol  succinate (TOPROL -XL) 50 MG 24 hr tablet Take 1 tablet (50 mg total) by mouth at bedtime. FOR HYPERTENSION 90 tablet 1   Multiple Vitamins-Minerals (CENTRUM SILVER PO) Take by mouth daily.     sertraline  (ZOLOFT ) 25 MG tablet Take 1 tablet (25 mg total) by mouth daily. 90 tablet 1   simvastatin  (ZOCOR ) 20 MG tablet Take 1 tablet (20 mg total)  by mouth daily. 90 tablet 1   telmisartan  (MICARDIS ) 40 MG tablet TAKE 1 TABLET BY MOUTH EVERYDAY AT BEDTIME 90 tablet 1   No facility-administered medications prior to visit.    Review of Systems;  Patient denies headache, fevers, malaise, unintentional weight loss, skin rash, eye pain, sinus congestion and sinus pain, sore throat, dysphagia,  hemoptysis , cough, dyspnea, wheezing, chest pain, palpitations, orthopnea, edema, abdominal pain, nausea, melena, diarrhea, constipation, flank pain, dysuria, hematuria, urinary  Frequency, nocturia, numbness, tingling, seizures,  Focal weakness, Loss of consciousness,  Tremor, insomnia, depression, anxiety, and suicidal ideation.      Objective:  BP (!) 142/64   Pulse (!) 57   Ht 5' 2 (1.575 m)   Wt 164 lb (74.4 kg)   SpO2 95%   BMI 30.00 kg/m   BP Readings from Last 3 Encounters:  04/18/24 (!) 142/64  11/30/23 136/68  08/30/23 (!) 150/76    Wt Readings from Last 3 Encounters:  04/18/24 164 lb (74.4 kg)  11/30/23 159 lb 6.4 oz (72.3 kg)  08/30/23 158 lb 12.8 oz (72 kg)    Physical Exam  Lab Results  Component Value Date   HGBA1C 5.6 06/08/2023   HGBA1C 5.7 07/15/2021   HGBA1C 5.7 02/07/2020    Lab Results  Component Value Date   CREATININE 1.24 (H) 06/08/2023   CREATININE 1.11 12/22/2022  CREATININE 1.29 (H) 06/02/2022    Lab Results  Component Value Date   WBC 5.2 12/22/2022   HGB 12.4 12/22/2022   HCT 37.4 12/22/2022   PLT 248.0 12/22/2022   GLUCOSE 81 06/08/2023   CHOL 189 06/08/2023   TRIG 83.0 06/08/2023   HDL 77.20 06/08/2023   LDLDIRECT 107.0 06/08/2023   LDLCALC 95 06/08/2023   ALT 13 06/08/2023   AST 17 06/08/2023   NA 143 06/08/2023   K 3.7 06/08/2023   CL 104 06/08/2023   CREATININE 1.24 (H) 06/08/2023   BUN 21 06/08/2023   CO2 30 06/08/2023   TSH 0.84 06/08/2023   HGBA1C 5.6 06/08/2023    MR BRAIN WO CONTRAST Result Date: 08/17/2023 CLINICAL DATA:  Memory impairment EXAM: MRI HEAD  WITHOUT CONTRAST TECHNIQUE: Multiplanar, multiecho pulse sequences of the brain and surrounding structures were obtained without intravenous contrast. COMPARISON:  Head CT from the same date FINDINGS: Brain: Age congruent brain volume and morphology. No acute infarction, hemorrhage, hydrocephalus, extra-axial collection or mass lesion. Chronic small vessel ischemia and cerebral volume loss which is mild for age. Thin appearance of the optic chiasm, nonspecific. Vascular: Normal flow voids. Skull and upper cervical spine: Normal marrow signal. Sinuses/Orbits: Negative. IMPRESSION: Age congruent involutional changes. No specific or reversible cause for memory loss. Electronically Signed   By: Tiffany Benjamin M.D.   On: 08/17/2023 06:25   CT HEAD WO CONTRAST ( ) Result Date: 07/24/2023 CLINICAL DATA:  Recent fall with headaches and neck pain, initial encounter EXAM: CT HEAD WITHOUT CONTRAST CT CERVICAL SPINE WITHOUT CONTRAST TECHNIQUE: Multidetector CT imaging of the head and cervical spine was performed following the standard protocol without intravenous contrast. Multiplanar CT image reconstructions of the cervical spine were also generated. RADIATION DOSE REDUCTION: This exam was performed according to the departmental dose-optimization program which includes automated exposure control, adjustment of the mA and/or kV according to patient size and/or use of iterative reconstruction technique. COMPARISON:  None Available. FINDINGS: CT HEAD FINDINGS Brain: No evidence of acute infarction, hemorrhage, hydrocephalus, extra-axial collection or mass lesion/mass effect. Mild atrophic and chronic white matter ischemic changes are noted. Vascular: No hyperdense vessel or unexpected calcification. Skull: Normal. Negative for fracture or focal lesion. Sinuses/Orbits: No acute finding. Other: None. CT CERVICAL SPINE FINDINGS Alignment: Within normal limits. Skull base and vertebrae: 7 cervical segments are well visualized.  Vertebral body height is well maintained. Osteophytic changes are noted from C5-C7. Multilevel facet hypertrophic changes are noted. No acute fracture or acute facet abnormality is noted. The odontoid is within normal limits. Soft tissues and spinal canal: Surrounding soft tissue structures are within normal limits. No focal hematoma is noted. Upper chest: Visualized lung apices are unremarkable. Other: None IMPRESSION: CT of the head: Chronic atrophic and ischemic changes without acute abnormality. CT of the cervical spine: Multilevel degenerative change without acute abnormality. Electronically Signed   By: Tiffany Benjamin M.D.   On: 07/24/2023 18:04   CT Cervical Spine Wo Contrast Result Date: 07/24/2023 CLINICAL DATA:  Recent fall with headaches and neck pain, initial encounter EXAM: CT HEAD WITHOUT CONTRAST CT CERVICAL SPINE WITHOUT CONTRAST TECHNIQUE: Multidetector CT imaging of the head and cervical spine was performed following the standard protocol without intravenous contrast. Multiplanar CT image reconstructions of the cervical spine were also generated. RADIATION DOSE REDUCTION: This exam was performed according to the departmental dose-optimization program which includes automated exposure control, adjustment of the mA and/or kV according to patient size and/or use of iterative reconstruction technique.  COMPARISON:  None Available. FINDINGS: CT HEAD FINDINGS Brain: No evidence of acute infarction, hemorrhage, hydrocephalus, extra-axial collection or mass lesion/mass effect. Mild atrophic and chronic white matter ischemic changes are noted. Vascular: No hyperdense vessel or unexpected calcification. Skull: Normal. Negative for fracture or focal lesion. Sinuses/Orbits: No acute finding. Other: None. CT CERVICAL SPINE FINDINGS Alignment: Within normal limits. Skull base and vertebrae: 7 cervical segments are well visualized. Vertebral body height is well maintained. Osteophytic changes are noted from  C5-C7. Multilevel facet hypertrophic changes are noted. No acute fracture or acute facet abnormality is noted. The odontoid is within normal limits. Soft tissues and spinal canal: Surrounding soft tissue structures are within normal limits. No focal hematoma is noted. Upper chest: Visualized lung apices are unremarkable. Other: None IMPRESSION: CT of the head: Chronic atrophic and ischemic changes without acute abnormality. CT of the cervical spine: Multilevel degenerative change without acute abnormality. Electronically Signed   By: Tiffany Benjamin M.D.   On: 07/24/2023 18:04    Assessment & Plan:  .Hyperlipidemia, unspecified hyperlipidemia type  Fatigue, unspecified type  Essential hypertension  Impaired fasting glucose     I spent 34 minutes on the day of this face to face encounter reviewing patient's  most recent visit with cardiology,  nephrology,  and neurology,  prior relevant surgical and non surgical procedures, recent  labs and imaging studies, counseling on weight management,  reviewing the assessment and plan with patient, and post visit ordering and reviewing of  diagnostics and therapeutics with patient  .   Follow-up: No follow-ups on file.   Tiffany LITTIE Kettering, MD

## 2024-04-20 NOTE — Assessment & Plan Note (Signed)
 She requires assistance at home to [employer].  I am recommending an aide 3 times per week for 2-3 hours  but she has declined assistance and I am not confident that  she will allow the aide to help her based on her demeanor today .  We are increasing sertrlaine to 50 mg daily to help manage her ancxety

## 2024-04-20 NOTE — Assessment & Plan Note (Signed)
 Continue tylenol  and tramadol  for pain control.

## 2024-04-20 NOTE — Assessment & Plan Note (Addendum)
 Well controlled on current  4 drug therapy Renal function stable, no changes today.   Lab Results  Component Value Date   CREATININE 1.25 (H) 04/18/2024   Lab Results  Component Value Date   NA 142 04/18/2024   K 4.1 04/18/2024   CL 101 04/18/2024   CO2 33 (H) 04/18/2024

## 2024-04-22 ENCOUNTER — Ambulatory Visit: Payer: Self-pay | Admitting: Internal Medicine

## 2024-06-06 ENCOUNTER — Institutional Professional Consult (permissible substitution): Payer: Medicare PPO | Admitting: Psychology

## 2024-06-06 ENCOUNTER — Ambulatory Visit: Payer: Self-pay

## 2024-06-09 ENCOUNTER — Other Ambulatory Visit: Payer: Self-pay | Admitting: Physician Assistant

## 2024-07-10 ENCOUNTER — Encounter: Payer: Medicare PPO | Admitting: Psychology

## 2024-07-13 ENCOUNTER — Telehealth: Payer: Self-pay

## 2024-07-13 NOTE — Telephone Encounter (Signed)
 Copied from CRM [id number]. Topic: General - Call Back - No Documentation >> Jul 13, 2024  2:12 PM Amber H wrote: Reason for CRM: Naomie states office called for patient however, there was no messages or notes in file.   Stated good call back number for patient was patients daughter Naomie Jansky [phone] Patient will be in after 5pm

## 2024-07-16 NOTE — Telephone Encounter (Signed)
 Do not see in the chart where anyone has called the pt.

## 2024-07-17 ENCOUNTER — Other Ambulatory Visit: Payer: Self-pay | Admitting: Physician Assistant

## 2024-07-18 ENCOUNTER — Other Ambulatory Visit: Payer: Self-pay | Admitting: Physician Assistant

## 2024-07-19 ENCOUNTER — Other Ambulatory Visit: Payer: Self-pay | Admitting: Physician Assistant

## 2024-07-20 ENCOUNTER — Other Ambulatory Visit: Payer: Self-pay | Admitting: Physician Assistant

## 2024-07-30 ENCOUNTER — Other Ambulatory Visit: Payer: Self-pay

## 2024-07-30 MED ORDER — MEMANTINE HCL 5 MG PO TABS: 5.0000 mg | ORAL_TABLET | Freq: Two times a day (BID) | ORAL | 1 refills | Status: AC

## 2024-07-30 NOTE — Telephone Encounter (Signed)
 Refilled by a different provider on 09/08/2023 Last OV: 04/18/2024 Next OV: 08/15/2024

## 2024-08-01 ENCOUNTER — Other Ambulatory Visit: Payer: Self-pay | Admitting: Internal Medicine

## 2024-08-01 DIAGNOSIS — Z87891 Personal history of nicotine dependence: Secondary | ICD-10-CM | POA: Diagnosis not present

## 2024-08-01 DIAGNOSIS — I10 Essential (primary) hypertension: Secondary | ICD-10-CM | POA: Diagnosis not present

## 2024-08-01 DIAGNOSIS — Z7982 Long term (current) use of aspirin: Secondary | ICD-10-CM | POA: Diagnosis not present

## 2024-08-01 DIAGNOSIS — E785 Hyperlipidemia, unspecified: Secondary | ICD-10-CM | POA: Diagnosis not present

## 2024-08-01 DIAGNOSIS — M1990 Unspecified osteoarthritis, unspecified site: Secondary | ICD-10-CM | POA: Diagnosis not present

## 2024-08-01 DIAGNOSIS — F411 Generalized anxiety disorder: Secondary | ICD-10-CM | POA: Diagnosis not present

## 2024-08-01 DIAGNOSIS — G309 Alzheimer's disease, unspecified: Secondary | ICD-10-CM | POA: Diagnosis not present

## 2024-08-01 DIAGNOSIS — G319 Degenerative disease of nervous system, unspecified: Secondary | ICD-10-CM | POA: Diagnosis not present

## 2024-08-01 DIAGNOSIS — Z833 Family history of diabetes mellitus: Secondary | ICD-10-CM | POA: Diagnosis not present

## 2024-08-15 ENCOUNTER — Encounter: Payer: Self-pay | Admitting: Internal Medicine

## 2024-08-15 ENCOUNTER — Ambulatory Visit: Admitting: Internal Medicine

## 2024-08-15 VITALS — BP 144/80 | HR 97 | Ht 62.0 in | Wt 162.2 lb

## 2024-08-15 DIAGNOSIS — G309 Alzheimer's disease, unspecified: Secondary | ICD-10-CM | POA: Diagnosis not present

## 2024-08-15 DIAGNOSIS — M25561 Pain in right knee: Secondary | ICD-10-CM | POA: Diagnosis not present

## 2024-08-15 DIAGNOSIS — I10 Essential (primary) hypertension: Secondary | ICD-10-CM

## 2024-08-15 DIAGNOSIS — Z0000 Encounter for general adult medical examination without abnormal findings: Secondary | ICD-10-CM | POA: Diagnosis not present

## 2024-08-15 DIAGNOSIS — N1832 Chronic kidney disease, stage 3b: Secondary | ICD-10-CM

## 2024-08-15 DIAGNOSIS — M170 Bilateral primary osteoarthritis of knee: Secondary | ICD-10-CM | POA: Diagnosis not present

## 2024-08-15 DIAGNOSIS — M25562 Pain in left knee: Secondary | ICD-10-CM | POA: Diagnosis not present

## 2024-08-15 DIAGNOSIS — Z23 Encounter for immunization: Secondary | ICD-10-CM | POA: Diagnosis not present

## 2024-08-15 DIAGNOSIS — G8929 Other chronic pain: Secondary | ICD-10-CM

## 2024-08-15 DIAGNOSIS — F0280 Dementia in other diseases classified elsewhere without behavioral disturbance: Secondary | ICD-10-CM

## 2024-08-15 MED ORDER — TELMISARTAN 40 MG PO TABS: ORAL_TABLET | ORAL | 1 refills | Status: DC

## 2024-08-15 MED ORDER — METOPROLOL SUCCINATE ER 50 MG PO TB24: 50.0000 mg | ORAL_TABLET | Freq: Every day | ORAL | 1 refills | Status: AC

## 2024-08-15 MED ORDER — HYDROCHLOROTHIAZIDE 25 MG PO TABS: 25.0000 mg | ORAL_TABLET | Freq: Every day | ORAL | 1 refills | Status: AC

## 2024-08-15 MED ORDER — BUSPIRONE HCL 15 MG PO TABS: 15.0000 mg | ORAL_TABLET | Freq: Three times a day (TID) | ORAL | 1 refills | Status: AC

## 2024-08-15 MED ORDER — TRAMADOL HCL 50 MG PO TABS: 50.0000 mg | ORAL_TABLET | Freq: Two times a day (BID) | ORAL | 2 refills | Status: AC

## 2024-08-15 MED ORDER — AMLODIPINE BESYLATE 10 MG PO TABS
10.0000 mg | ORAL_TABLET | Freq: Every day | ORAL | 1 refills | Status: AC
Start: 2024-08-15 — End: ?

## 2024-08-15 MED ORDER — SIMVASTATIN 20 MG PO TABS: 20.0000 mg | ORAL_TABLET | Freq: Every day | ORAL | 1 refills | Status: AC

## 2024-08-15 MED ORDER — TELMISARTAN 80 MG PO TABS: 80.0000 mg | ORAL_TABLET | Freq: Every day | ORAL | 1 refills | Status: AC

## 2024-08-15 MED ORDER — TRAMADOL HCL 50 MG PO TABS: 50.0000 mg | ORAL_TABLET | Freq: Four times a day (QID) | ORAL | 0 refills | Status: AC | PRN

## 2024-08-15 NOTE — Assessment & Plan Note (Signed)
 Renal function is at  baseline with avoidance of NSAIDs.  She is on an ARB for control of hypertension,, and statin for control of hyperlipidemia. Uacr was >3 0 ast last visit. I have increased her telmisartan  dose to 80 mg   Lab Results  Component Value Date   CREATININE 1.25 (H) 04/18/2024   Lab Results  Component Value Date   NA 142 04/18/2024   K 4.1 04/18/2024   CL 101 04/18/2024   CO2 33 (H) 04/18/2024

## 2024-08-15 NOTE — Patient Instructions (Addendum)
 Your knees are very hard to inject because you have bone on bone  I would prefer that you use medications to manage your pain as follows:  Take 1000 mg tylenol  every 12 hours   Take One tramadol  daily around lunchtime   We can increase tramadol  to 2 daily  (breakfast and dinner) if we need to ,  before we  try  another injection of each knee   Schedule an appt in one month in case we need to inject (you can cancel if not needed)    I HAVE INCREASED YOUR TELMISARTAN  DOSE TO 80 MG DAILY TO TAKE SOME PRESSURE OFF OF YOUR KIDNEY

## 2024-08-15 NOTE — Progress Notes (Unsigned)
 Patient ID: Tiffany Benjamin, female    DOB: 1932/11/25  Age: 89 y.o. MRN: [id number]  The patient is here for annual preventive examination and management of other chronic and acute problems.   The risk factors are reflected in the social history.   The roster of all physicians providing medical care to patient - is listed in the Snapshot section of the chart.   Activities of daily living:  The patient is 100% independent in all ADLs: dressing, toileting, feeding as well as independent mobility   Home safety : The patient has smoke detectors in the home. They wear seatbelts.  There are no unsecured firearms at home. There is no violence in the home.    There is no risks for hepatitis, STDs or HIV. There is no   history of blood transfusion. They have no travel history to infectious disease endemic areas of the world.   The patient has seen their dentist in the last six month. They have seen their eye doctor in the last year. The patinet  denies slight hearing difficulty with regard to whispered voices and some television programs.  They have deferred audiologic testing in the last year.  They do not  have excessive sun exposure. Discussed the need for sun protection: hats, long sleeves and use of sunscreen if there is significant sun exposure.    Diet: the importance of a healthy diet is discussed. They do have a healthy diet.   The benefits of regular aerobic exercise were discussed. The patient  exercises  3 to 5 days per week  for  60 minutes.    Depression screen: there are no signs or vegative symptoms of depression- irritability, change in appetite, anhedonia, sadness/tearfullness.   The following portions of the patient's history were reviewed and updated as appropriate: allergies, current medications, past family history, past medical history,  past surgical history, past social history  and problem list.   Visual acuity was not assessed per patient preference since the patient has  regular follow up with an  ophthalmologist. Hearing and body mass index were assessed and reviewed.    During the course of the visit the patient was educated and counseled about appropriate screening and preventive services including : fall prevention , diabetes screening, nutrition counseling, colorectal cancer screening, and recommended immunizations.    Chief Complaint:   BILATERAL KNEE PAIN:  SEVERE DJD;;  LAST INJECTED FEBRUARY 2025 .  Taking tylenol  daily   Not getting along with sister who is bossy    Review of Symptoms  Patient denies headache, fevers, malaise, unintentional weight loss, skin rash, eye pain, sinus congestion and sinus pain, sore throat, dysphagia,  hemoptysis , cough, dyspnea, wheezing, chest pain, palpitations, orthopnea, edema, abdominal pain, nausea, melena, diarrhea, constipation, flank pain, dysuria, hematuria, urinary  Frequency, nocturia, numbness, tingling, seizures,  Focal weakness, Loss of consciousness,  Tremor, insomnia, depression, anxiety, and suicidal ideation.    Physical Exam:  BP (!) 144/80   Pulse 97   Ht 5' 2 (1.575 m)   Wt 162 lb 3.2 oz (73.6 kg)   SpO2 97%   BMI 29.67 kg/m    Physical Exam  Assessment and Plan: There are no diagnoses linked to this encounter.  No follow-ups on file.  Tiffany LITTIE Kettering, MD

## 2024-08-15 NOTE — Assessment & Plan Note (Signed)
 LAST INJECTED FEB 2025

## 2024-08-17 ENCOUNTER — Telehealth: Payer: Self-pay | Admitting: Internal Medicine

## 2024-08-17 NOTE — Assessment & Plan Note (Signed)
 Readings are elevated at home based on reports.  Increasing telmisartan  to 80 mg

## 2024-08-17 NOTE — Telephone Encounter (Signed)
 Pt daughter dropped off medical forms to be signed and placed in the back mailbox.

## 2024-08-17 NOTE — Assessment & Plan Note (Signed)
 She requires assistance at home to manage medications and maintain good body  hygiene.   She is now assisted by her older sister who moved down from [place] to live  with her. I

## 2024-08-17 NOTE — Assessment & Plan Note (Addendum)
 Not at goal.  Increase telmisartan  to 80 mg daily . Continue amlodipine  10 mg , hydrochlorothiazide  25 mg, and metoprolol  50 mg

## 2024-08-17 NOTE — Assessment & Plan Note (Signed)

## 2024-08-17 NOTE — Telephone Encounter (Signed)
Placed in provider folder for review and signature.

## 2024-08-17 NOTE — Assessment & Plan Note (Signed)
 Secondary to advanced  DJD knees.  She is requesting bilateral steroid injections which was last done in Feb 2025 .  These have  ecome exceedingly difficult due to loss of cartilage space.  She has not been treating her pain on a regular basis with tylenol  and tramadol .  Refilling tramadol  today for use 1-2 times daily and recommend scheduled use of tylenol .  Rtc one month if pain is not controlled with medications.

## 2024-08-21 NOTE — Telephone Encounter (Signed)
Filled out what I could and placed in red folder for completion.

## 2024-08-22 NOTE — Telephone Encounter (Signed)
 open in error

## 2024-08-23 NOTE — Telephone Encounter (Signed)
 Faxed and pt's daughter is aware.

## 2024-09-06 ENCOUNTER — Telehealth: Payer: Self-pay

## 2024-09-06 NOTE — Telephone Encounter (Signed)
 Copied from CRM #[id number]. Topic: Clinical - Medical Advice >> Sep 06, 2024  2:58 PM Alfonso ORN wrote: Reason for CRM: pt daughter called for information on how to get pt set up with home health care for assistance with daily living due to alzheimer's . Please call pt daughter [phone]

## 2024-09-07 NOTE — Telephone Encounter (Signed)
 Spoke with pt's daughter to let her know that we have to schedule an in person appt for documentation and insurance purposes. Pt is scheduled for an appt on 09/17/2024

## 2024-09-17 ENCOUNTER — Encounter: Payer: Self-pay | Admitting: Internal Medicine

## 2024-09-17 ENCOUNTER — Ambulatory Visit (INDEPENDENT_AMBULATORY_CARE_PROVIDER_SITE_OTHER): Admitting: Internal Medicine

## 2024-09-17 VITALS — BP 150/84 | HR 101 | Ht 62.0 in | Wt 162.4 lb

## 2024-09-17 DIAGNOSIS — M25562 Pain in left knee: Secondary | ICD-10-CM

## 2024-09-17 DIAGNOSIS — M25561 Pain in right knee: Secondary | ICD-10-CM

## 2024-09-17 DIAGNOSIS — G8929 Other chronic pain: Secondary | ICD-10-CM | POA: Diagnosis not present

## 2024-09-17 MED ORDER — TRIAMCINOLONE ACETONIDE 40 MG/ML IJ SUSP
40.0000 mg | Freq: Once | INTRAMUSCULAR | Status: AC
  Administered 2024-09-17: 18:00:00 40 mg

## 2024-09-17 MED ORDER — LIDOCAINE HCL 1 % IJ SOLN
4.0000 mL | Freq: Once | INTRAMUSCULAR | Status: AC
  Administered 2024-09-17: 18:00:00 4 mL

## 2024-09-18 NOTE — Assessment & Plan Note (Signed)
 After consent was obtained, using sterile technique the right  knee was prepped and draped .  Topical lidocaine  spray was directed and sprayed onto the skin covering the medial side of the right patella.    The knee joint was entered from the inferomedial approach  and 0 ml's of fluid was withdrawn.   4 ml plain Lidocaine  and 1 ml of Kenalog  was then injected and the needle withdrawn.  The procedure was well tolerated.  The patient is asked to continue to rest the knee for a few more days before resuming regular activities.  It may be more painful for the first 1-2 days.  Watch for fever, or increased swelling or persistent pain in knee. Call or return to clinic prn if such symptoms occur or the knee fails to improve as anticipated.   After consent was obtained, using sterile technique the  LEFT  knee was prepped and draped .  Topical lidocaine  spray was directed and sprayed  on the skin  covering the medial aspect of the left knee.   The knee joint was entered from the inferomedial approach  and 0 ml's of fluid was withdrawn.   4 ml plain Lidocaine  and 1 ml of Kenalog  was then injected and the needle withdrawn.  The procedure was well tolerated.  The patient is asked to continue to rest the knee for a few more days before resuming regular activities.  It may be more painful for the first 1-2 days.  Watch for fever, or increased swelling or persistent pain in knee. Call or return to clinic prn if such symptoms occur or the knee fails to improve as anticipated.

## 2024-09-18 NOTE — Progress Notes (Signed)
 Subjective:  Patient ID: Tiffany Benjamin, female    DOB: July 01, 1933  Age: 89 y.o. MRN: [id number]  CC: The encounter diagnosis was Chronic pain of both knees.   HPI Tiffany Benjamin presents for  Chief Complaint  Patient presents with   bilateral knee injections   Tiffany Benjamin is an 89 YR OLD female with advanced DJD of both knees, CKD and hypertension.  Her joint pain is  managed with tylenol  and tramadol  used daily and periodic I/A steroid injections.  Her last I/A in both knees was  February 2025 and she presents for scheduled injections today.  She is accompanied by her daughter Tiffany Benjamin. Since her last visit she has not had any falls and denies any redness or warmth to either knee    Outpatient Medications Prior to Visit  Medication Sig Dispense Refill   amLODipine  (NORVASC ) 10 MG tablet Take 1 tablet (10 mg total) by mouth daily. IN THE MORNING FOR HYPERTENSION 90 tablet 1   aspirin 81 MG tablet Take 81 mg by mouth daily.     busPIRone  (BUSPAR ) 15 MG tablet Take 1 tablet (15 mg total) by mouth 3 (three) times daily. 270 tablet 1   cholecalciferol (VITAMIN D3) 25 MCG (1000 UNIT) tablet Take 1,000 Units by mouth daily.     Cyanocobalamin  (VITAMIN B-12 PO) Take by mouth daily.     hydrochlorothiazide  (HYDRODIURIL ) 25 MG tablet Take 1 tablet (25 mg total) by mouth daily. In the morning for hypertension 90 tablet 1   memantine  (NAMENDA ) 5 MG tablet Take 1 tablet (5 mg total) by mouth 2 (two) times daily. Take 1 tablet (5 mg at night) for 2 weeks, then increase to 1 tablet (5 mg) twice a day 180 tablet 1   metoprolol  succinate (TOPROL -XL) 50 MG 24 hr tablet Take 1 tablet (50 mg total) by mouth at bedtime. FOR HYPERTENSION 90 tablet 1   Multiple Vitamins-Minerals (CENTRUM SILVER PO) Take by mouth daily.     sertraline  (ZOLOFT ) 50 MG tablet TAKE 1 TABLET BY MOUTH EVERY DAY 90 tablet 1   simvastatin  (ZOCOR ) 20 MG tablet Take 1 tablet (20 mg total) by mouth daily. 90 tablet 1   telmisartan   (MICARDIS ) 80 MG tablet Take 1 tablet (80 mg total) by mouth daily. 90 tablet 1   traMADol  (ULTRAM ) 50 MG tablet Take 1 tablet (50 mg total) by mouth 2 (two) times daily. 60 tablet 2   No facility-administered medications prior to visit.    Review of Systems;  Patient denies headache, fevers, malaise, unintentional weight loss, skin rash, eye pain, sinus congestion and sinus pain, sore throat, dysphagia,  hemoptysis , cough, dyspnea, wheezing, chest pain, palpitations, orthopnea, edema, abdominal pain, nausea, melena, diarrhea, constipation, flank pain, dysuria, hematuria, urinary  Frequency, nocturia, numbness, tingling, seizures,  Focal weakness, Loss of consciousness,  Tremor, insomnia, depression, anxiety, and suicidal ideation.      Objective:  BP (!) 150/84   Pulse (!) 101   Ht 5' 2 (1.575 m)   Wt 162 lb 6.4 oz (73.7 kg)   SpO2 99%   BMI 29.70 kg/m   BP Readings from Last 3 Encounters:  09/17/24 (!) 150/84  08/15/24 (!) 144/80  04/18/24 (!) 142/64    Wt Readings from Last 3 Encounters:  09/17/24 162 lb 6.4 oz (73.7 kg)  08/15/24 162 lb 3.2 oz (73.6 kg)  04/18/24 164 lb (74.4 kg)    Physical Exam Vitals reviewed.  Constitutional:      General:  She is not in acute distress.    Appearance: Normal appearance. She is normal weight. She is not ill-appearing, toxic-appearing or diaphoretic.  HENT:     Head: Normocephalic.  Eyes:     General: No scleral icterus.       Right eye: No discharge.        Left eye: No discharge.     Conjunctiva/sclera: Conjunctivae normal.  Cardiovascular:     Rate and Rhythm: Normal rate and regular rhythm.     Heart sounds: Normal heart sounds.  Pulmonary:     Effort: Pulmonary effort is normal. No respiratory distress.     Breath sounds: Normal breath sounds.  Musculoskeletal:     Right knee: Deformity present. No effusion, erythema or ecchymosis. Decreased range of motion.     Left knee: Deformity present. No effusion, erythema or  ecchymosis. Decreased range of motion.     Comments: Advanced arthritic changes   Skin:    General: Skin is warm and dry.  Neurological:     General: No focal deficit present.     Mental Status: She is alert and oriented to person, place, and time. Mental status is at baseline.  Psychiatric:        Mood and Affect: Mood normal.        Behavior: Behavior normal.        Thought Content: Thought content normal.        Judgment: Judgment normal.     Lab Results  Component Value Date   HGBA1C 5.9 04/18/2024   HGBA1C 5.6 06/08/2023   HGBA1C 5.7 07/15/2021    Lab Results  Component Value Date   CREATININE 1.25 (H) 04/18/2024   CREATININE 1.24 (H) 06/08/2023   CREATININE 1.11 12/22/2022    Lab Results  Component Value Date   WBC 5.3 04/18/2024   HGB 12.3 04/18/2024   HCT 36.5 04/18/2024   PLT 213.0 04/18/2024   GLUCOSE 84 04/18/2024   CHOL 176 04/18/2024   TRIG 86.0 04/18/2024   HDL 85.70 04/18/2024   LDLDIRECT 69.0 04/18/2024   LDLCALC 73 04/18/2024   ALT 14 04/18/2024   AST 20 04/18/2024   NA 142 04/18/2024   K 4.1 04/18/2024   CL 101 04/18/2024   CREATININE 1.25 (H) 04/18/2024   BUN 25 (H) 04/18/2024   CO2 33 (H) 04/18/2024   TSH 0.94 04/18/2024   HGBA1C 5.9 04/18/2024   MICROALBUR 4.7 (H) 04/18/2024       Assessment & Plan:  .Chronic pain of both knees Assessment & Plan: After consent was obtained, using sterile technique the right  knee was prepped and draped .  Topical lidocaine  spray was directed and sprayed onto the skin covering the medial side of the right patella.    The knee joint was entered from the inferomedial approach  and 0 ml's of fluid was withdrawn.   4 ml plain Lidocaine  and 1 ml of Kenalog  was then injected and the needle withdrawn.  The procedure was well tolerated.  The patient is asked to continue to rest the knee for a few more days before resuming regular activities.  It may be more painful for the first 1-2 days.  Watch for fever, or  increased swelling or persistent pain in knee. Call or return to clinic prn if such symptoms occur or the knee fails to improve as anticipated.   After consent was obtained, using sterile technique the  LEFT  knee was prepped and draped .  Topical lidocaine   spray was directed and sprayed  on the skin  covering the medial aspect of the left knee.   The knee joint was entered from the inferomedial approach  and 0 ml's of fluid was withdrawn.   4 ml plain Lidocaine  and 1 ml of Kenalog  was then injected and the needle withdrawn.  The procedure was well tolerated.  The patient is asked to continue to rest the knee for a few more days before resuming regular activities.  It may be more painful for the first 1-2 days.  Watch for fever, or increased swelling or persistent pain in knee. Call or return to clinic prn if such symptoms occur or the knee fails to improve as anticipated.   Orders: -     Lidocaine  HCl -     Triamcinolone  Acetonide -     Lidocaine  HCl -     Triamcinolone  Acetonide     I spent 34 minutes on the day of this face to face encounter reviewing patient's  most recent visit with cardiology,  nephrology,  and neurology,  prior relevant surgical and non surgical procedures, recent  labs and imaging studies, counseling on weight management,  reviewing the assessment and plan with patient, and post visit ordering and reviewing of  diagnostics and therapeutics with patient  .   Follow-up: No follow-ups on file.   [name], MD

## 2024-10-25 ENCOUNTER — Telehealth: Payer: Self-pay

## 2024-10-25 NOTE — Telephone Encounter (Signed)
 Pt's daughter came by the office today to see about getting a letter that states that pt has dementia and when she was diagnosed with it. The pt called the alarm system company the other day and had them come out and remove the system because she didn't know how to work it. However the daughter stated that they still have to pay for it so she has asked them to come back to reinstall it. The only way they will reinstall for no cost is if they can get a letter stating that pt has dementia. Daughter would like to pick up the letter tomorrow before the weather gets bad.

## 2024-10-30 NOTE — Telephone Encounter (Signed)
 Pt daughter is aware. Will pick up tomorrow.

## 2024-10-31 NOTE — Telephone Encounter (Signed)
 FYI..[employer] has picked the form up for the pt.
